# Patient Record
Sex: Female | Born: 1975 | Race: Black or African American | Hispanic: No | Marital: Single | Smoking: Current every day smoker
Health system: Southern US, Community
[De-identification: ages and names within clinical notes are randomized; demographics above are authoritative.]

## PROBLEM LIST (undated history)

## (undated) DIAGNOSIS — R87629 Unspecified abnormal cytological findings in specimens from vagina: Secondary | ICD-10-CM

## (undated) DIAGNOSIS — M25512 Pain in left shoulder: Secondary | ICD-10-CM

## (undated) DIAGNOSIS — I10 Essential (primary) hypertension: Secondary | ICD-10-CM

## (undated) HISTORY — PX: TUBAL LIGATION: SHX77

## (undated) HISTORY — DX: Pain in left shoulder: M25.512

## (undated) HISTORY — DX: Essential (primary) hypertension: I10

## (undated) HISTORY — DX: Unspecified abnormal cytological findings in specimens from vagina: R87.629

---

## 1999-01-11 ENCOUNTER — Emergency Department (HOSPITAL_COMMUNITY): Admission: EM | Admit: 1999-01-11 | Discharge: 1999-01-11 | Payer: Self-pay | Admitting: Emergency Medicine

## 1999-04-11 ENCOUNTER — Emergency Department (HOSPITAL_COMMUNITY): Admission: EM | Admit: 1999-04-11 | Discharge: 1999-04-11 | Payer: Self-pay | Admitting: Emergency Medicine

## 1999-06-03 ENCOUNTER — Inpatient Hospital Stay (HOSPITAL_COMMUNITY): Admission: AD | Admit: 1999-06-03 | Discharge: 1999-06-03 | Payer: Self-pay | Admitting: Obstetrics & Gynecology

## 1999-06-05 ENCOUNTER — Emergency Department (HOSPITAL_COMMUNITY): Admission: EM | Admit: 1999-06-05 | Discharge: 1999-06-05 | Payer: Self-pay | Admitting: Emergency Medicine

## 1999-06-05 ENCOUNTER — Encounter: Payer: Self-pay | Admitting: Emergency Medicine

## 1999-06-09 ENCOUNTER — Emergency Department (HOSPITAL_COMMUNITY): Admission: EM | Admit: 1999-06-09 | Discharge: 1999-06-09 | Payer: Self-pay | Admitting: Emergency Medicine

## 1999-11-02 ENCOUNTER — Encounter: Payer: Self-pay | Admitting: Emergency Medicine

## 1999-11-02 ENCOUNTER — Emergency Department (HOSPITAL_COMMUNITY): Admission: EM | Admit: 1999-11-02 | Discharge: 1999-11-02 | Payer: Self-pay | Admitting: Emergency Medicine

## 1999-11-05 ENCOUNTER — Emergency Department (HOSPITAL_COMMUNITY): Admission: EM | Admit: 1999-11-05 | Discharge: 1999-11-05 | Payer: Self-pay | Admitting: Emergency Medicine

## 1999-11-26 ENCOUNTER — Emergency Department (HOSPITAL_COMMUNITY): Admission: EM | Admit: 1999-11-26 | Discharge: 1999-11-26 | Payer: Self-pay | Admitting: Emergency Medicine

## 1999-11-26 ENCOUNTER — Encounter: Payer: Self-pay | Admitting: Emergency Medicine

## 2000-03-17 ENCOUNTER — Encounter: Payer: Self-pay | Admitting: Emergency Medicine

## 2000-03-17 ENCOUNTER — Emergency Department (HOSPITAL_COMMUNITY): Admission: EM | Admit: 2000-03-17 | Discharge: 2000-03-17 | Payer: Self-pay | Admitting: Emergency Medicine

## 2000-09-02 ENCOUNTER — Emergency Department (HOSPITAL_COMMUNITY): Admission: EM | Admit: 2000-09-02 | Discharge: 2000-09-02 | Payer: Self-pay | Admitting: *Deleted

## 2000-12-19 ENCOUNTER — Emergency Department (HOSPITAL_COMMUNITY): Admission: EM | Admit: 2000-12-19 | Discharge: 2000-12-19 | Payer: Self-pay | Admitting: Emergency Medicine

## 2001-01-27 ENCOUNTER — Emergency Department (HOSPITAL_COMMUNITY): Admission: EM | Admit: 2001-01-27 | Discharge: 2001-01-27 | Payer: Self-pay | Admitting: Emergency Medicine

## 2001-12-09 ENCOUNTER — Inpatient Hospital Stay (HOSPITAL_COMMUNITY): Admission: AD | Admit: 2001-12-09 | Discharge: 2001-12-09 | Payer: Self-pay | Admitting: *Deleted

## 2001-12-10 ENCOUNTER — Encounter: Payer: Self-pay | Admitting: *Deleted

## 2001-12-10 ENCOUNTER — Inpatient Hospital Stay (HOSPITAL_COMMUNITY): Admission: AD | Admit: 2001-12-10 | Discharge: 2001-12-10 | Payer: Self-pay | Admitting: *Deleted

## 2001-12-21 ENCOUNTER — Inpatient Hospital Stay (HOSPITAL_COMMUNITY): Admission: RE | Admit: 2001-12-21 | Discharge: 2001-12-21 | Payer: Self-pay | Admitting: *Deleted

## 2001-12-21 ENCOUNTER — Encounter: Payer: Self-pay | Admitting: *Deleted

## 2002-01-04 ENCOUNTER — Encounter: Payer: Self-pay | Admitting: *Deleted

## 2002-01-04 ENCOUNTER — Inpatient Hospital Stay (HOSPITAL_COMMUNITY): Admission: RE | Admit: 2002-01-04 | Discharge: 2002-01-04 | Payer: Self-pay | Admitting: *Deleted

## 2002-02-02 ENCOUNTER — Inpatient Hospital Stay (HOSPITAL_COMMUNITY): Admission: AD | Admit: 2002-02-02 | Discharge: 2002-02-02 | Payer: Self-pay | Admitting: *Deleted

## 2002-04-15 ENCOUNTER — Inpatient Hospital Stay (HOSPITAL_COMMUNITY): Admission: AD | Admit: 2002-04-15 | Discharge: 2002-04-15 | Payer: Self-pay | Admitting: Obstetrics

## 2002-04-15 ENCOUNTER — Encounter: Payer: Self-pay | Admitting: Obstetrics

## 2002-06-06 ENCOUNTER — Encounter: Payer: Self-pay | Admitting: Obstetrics

## 2002-06-06 ENCOUNTER — Ambulatory Visit (HOSPITAL_COMMUNITY): Admission: RE | Admit: 2002-06-06 | Discharge: 2002-06-06 | Payer: Self-pay | Admitting: Obstetrics

## 2002-06-14 ENCOUNTER — Inpatient Hospital Stay (HOSPITAL_COMMUNITY): Admission: AD | Admit: 2002-06-14 | Discharge: 2002-06-16 | Payer: Self-pay | Admitting: Obstetrics

## 2002-06-27 ENCOUNTER — Inpatient Hospital Stay (HOSPITAL_COMMUNITY): Admission: AD | Admit: 2002-06-27 | Discharge: 2002-06-29 | Payer: Self-pay | Admitting: Obstetrics

## 2002-06-28 ENCOUNTER — Encounter: Payer: Self-pay | Admitting: Obstetrics

## 2002-06-30 ENCOUNTER — Encounter: Payer: Self-pay | Admitting: Obstetrics & Gynecology

## 2002-06-30 ENCOUNTER — Inpatient Hospital Stay (HOSPITAL_COMMUNITY): Admission: AD | Admit: 2002-06-30 | Discharge: 2002-06-30 | Payer: Self-pay | Admitting: Obstetrics

## 2002-07-01 ENCOUNTER — Encounter (INDEPENDENT_AMBULATORY_CARE_PROVIDER_SITE_OTHER): Payer: Self-pay | Admitting: Specialist

## 2002-07-01 ENCOUNTER — Inpatient Hospital Stay (HOSPITAL_COMMUNITY): Admission: AD | Admit: 2002-07-01 | Discharge: 2002-07-04 | Payer: Self-pay | Admitting: Obstetrics

## 2002-11-18 ENCOUNTER — Emergency Department (HOSPITAL_COMMUNITY): Admission: EM | Admit: 2002-11-18 | Discharge: 2002-11-18 | Payer: Self-pay

## 2003-02-25 ENCOUNTER — Emergency Department (HOSPITAL_COMMUNITY): Admission: EM | Admit: 2003-02-25 | Discharge: 2003-02-26 | Payer: Self-pay | Admitting: Emergency Medicine

## 2003-05-04 ENCOUNTER — Emergency Department (HOSPITAL_COMMUNITY): Admission: EM | Admit: 2003-05-04 | Discharge: 2003-05-04 | Payer: Self-pay | Admitting: Emergency Medicine

## 2004-02-13 ENCOUNTER — Emergency Department (HOSPITAL_COMMUNITY): Admission: EM | Admit: 2004-02-13 | Discharge: 2004-02-13 | Payer: Self-pay | Admitting: Emergency Medicine

## 2004-03-01 ENCOUNTER — Emergency Department (HOSPITAL_COMMUNITY): Admission: EM | Admit: 2004-03-01 | Discharge: 2004-03-01 | Payer: Self-pay | Admitting: Emergency Medicine

## 2005-02-17 ENCOUNTER — Inpatient Hospital Stay (HOSPITAL_COMMUNITY): Admission: AD | Admit: 2005-02-17 | Discharge: 2005-02-17 | Payer: Self-pay | Admitting: *Deleted

## 2005-02-17 ENCOUNTER — Ambulatory Visit: Payer: Self-pay | Admitting: Certified Nurse Midwife

## 2005-02-24 ENCOUNTER — Ambulatory Visit: Payer: Self-pay | Admitting: Family Medicine

## 2005-03-03 ENCOUNTER — Ambulatory Visit: Payer: Self-pay | Admitting: *Deleted

## 2005-03-17 ENCOUNTER — Ambulatory Visit: Payer: Self-pay | Admitting: Family Medicine

## 2005-03-17 ENCOUNTER — Ambulatory Visit (HOSPITAL_COMMUNITY): Admission: RE | Admit: 2005-03-17 | Discharge: 2005-03-17 | Payer: Self-pay | Admitting: *Deleted

## 2005-03-31 ENCOUNTER — Ambulatory Visit: Payer: Self-pay | Admitting: Family Medicine

## 2005-04-06 ENCOUNTER — Ambulatory Visit (HOSPITAL_COMMUNITY): Admission: RE | Admit: 2005-04-06 | Discharge: 2005-04-06 | Payer: Self-pay | Admitting: *Deleted

## 2005-04-06 ENCOUNTER — Ambulatory Visit: Payer: Self-pay | Admitting: *Deleted

## 2005-04-20 ENCOUNTER — Ambulatory Visit: Payer: Self-pay | Admitting: Obstetrics & Gynecology

## 2005-04-27 ENCOUNTER — Ambulatory Visit (HOSPITAL_COMMUNITY): Admission: RE | Admit: 2005-04-27 | Discharge: 2005-04-27 | Payer: Self-pay | Admitting: *Deleted

## 2005-04-27 ENCOUNTER — Ambulatory Visit: Payer: Self-pay | Admitting: Obstetrics & Gynecology

## 2005-05-04 ENCOUNTER — Ambulatory Visit: Payer: Self-pay | Admitting: Obstetrics & Gynecology

## 2005-05-09 ENCOUNTER — Inpatient Hospital Stay (HOSPITAL_COMMUNITY): Admission: AD | Admit: 2005-05-09 | Discharge: 2005-05-09 | Payer: Self-pay | Admitting: Obstetrics & Gynecology

## 2005-05-09 ENCOUNTER — Ambulatory Visit: Payer: Self-pay | Admitting: Family Medicine

## 2005-05-10 ENCOUNTER — Inpatient Hospital Stay (HOSPITAL_COMMUNITY): Admission: AD | Admit: 2005-05-10 | Discharge: 2005-05-10 | Payer: Self-pay | Admitting: *Deleted

## 2005-05-10 ENCOUNTER — Ambulatory Visit: Payer: Self-pay | Admitting: *Deleted

## 2005-05-11 ENCOUNTER — Ambulatory Visit: Payer: Self-pay | Admitting: Obstetrics and Gynecology

## 2005-05-11 ENCOUNTER — Inpatient Hospital Stay (HOSPITAL_COMMUNITY): Admission: AD | Admit: 2005-05-11 | Discharge: 2005-05-14 | Payer: Self-pay | Admitting: *Deleted

## 2005-05-11 ENCOUNTER — Encounter (INDEPENDENT_AMBULATORY_CARE_PROVIDER_SITE_OTHER): Payer: Self-pay | Admitting: *Deleted

## 2005-06-03 ENCOUNTER — Inpatient Hospital Stay (HOSPITAL_COMMUNITY): Admission: AD | Admit: 2005-06-03 | Discharge: 2005-06-07 | Payer: Self-pay | Admitting: Family Medicine

## 2005-06-03 ENCOUNTER — Ambulatory Visit: Payer: Self-pay | Admitting: Obstetrics and Gynecology

## 2005-06-21 ENCOUNTER — Ambulatory Visit: Payer: Self-pay | Admitting: Obstetrics and Gynecology

## 2007-05-10 ENCOUNTER — Emergency Department (HOSPITAL_COMMUNITY): Admission: EM | Admit: 2007-05-10 | Discharge: 2007-05-10 | Payer: Self-pay | Admitting: Emergency Medicine

## 2007-05-18 ENCOUNTER — Ambulatory Visit: Payer: Self-pay | Admitting: *Deleted

## 2007-09-12 ENCOUNTER — Emergency Department (HOSPITAL_COMMUNITY): Admission: EM | Admit: 2007-09-12 | Discharge: 2007-09-12 | Payer: Self-pay | Admitting: Emergency Medicine

## 2007-10-28 ENCOUNTER — Emergency Department (HOSPITAL_COMMUNITY): Admission: EM | Admit: 2007-10-28 | Discharge: 2007-10-28 | Payer: Self-pay | Admitting: Emergency Medicine

## 2007-10-31 ENCOUNTER — Emergency Department (HOSPITAL_COMMUNITY): Admission: EM | Admit: 2007-10-31 | Discharge: 2007-10-31 | Payer: Self-pay | Admitting: Emergency Medicine

## 2008-10-29 ENCOUNTER — Emergency Department (HOSPITAL_COMMUNITY): Admission: EM | Admit: 2008-10-29 | Discharge: 2008-10-29 | Payer: Self-pay | Admitting: Emergency Medicine

## 2009-08-05 ENCOUNTER — Emergency Department (HOSPITAL_COMMUNITY): Admission: EM | Admit: 2009-08-05 | Discharge: 2009-08-06 | Payer: Self-pay | Admitting: Emergency Medicine

## 2009-11-27 ENCOUNTER — Emergency Department (HOSPITAL_COMMUNITY): Admission: EM | Admit: 2009-11-27 | Discharge: 2009-11-27 | Payer: Self-pay | Admitting: Emergency Medicine

## 2010-04-26 ENCOUNTER — Emergency Department (HOSPITAL_COMMUNITY): Admission: EM | Admit: 2010-04-26 | Discharge: 2010-04-26 | Payer: Self-pay | Admitting: Family Medicine

## 2010-04-29 ENCOUNTER — Emergency Department (HOSPITAL_COMMUNITY): Admission: EM | Admit: 2010-04-29 | Discharge: 2010-04-29 | Payer: Self-pay | Admitting: Emergency Medicine

## 2010-05-03 ENCOUNTER — Emergency Department (HOSPITAL_COMMUNITY): Admission: EM | Admit: 2010-05-03 | Discharge: 2010-05-03 | Payer: Self-pay | Admitting: Emergency Medicine

## 2010-05-23 ENCOUNTER — Emergency Department (HOSPITAL_COMMUNITY): Admission: EM | Admit: 2010-05-23 | Discharge: 2010-05-23 | Payer: Self-pay | Admitting: Family Medicine

## 2010-08-14 ENCOUNTER — Encounter: Payer: Self-pay | Admitting: *Deleted

## 2010-10-06 LAB — CULTURE, ROUTINE-ABSCESS

## 2010-10-30 ENCOUNTER — Emergency Department (HOSPITAL_COMMUNITY)
Admission: EM | Admit: 2010-10-30 | Discharge: 2010-10-30 | Disposition: A | Discharge status: Home / Self Care | Payer: Medicaid Other | Attending: Emergency Medicine | Admitting: Emergency Medicine

## 2010-10-30 DIAGNOSIS — N63 Unspecified lump in unspecified breast: Secondary | ICD-10-CM | POA: Insufficient documentation

## 2010-10-30 DIAGNOSIS — N644 Mastodynia: Secondary | ICD-10-CM | POA: Insufficient documentation

## 2010-12-10 NOTE — Discharge Summary (Signed)
NAMEMISSEY, HASLEY              ACCOUNT NO.:  [id number]   MEDICAL RECORD NO.:  [id number]          PATIENT TYPE:  INP   LOCATION:  9106                          FACILITY:  [facility]   PHYSICIAN:  Conni Elliot, M.D.DATE OF BIRTH:  1975-12-06   DATE OF ADMISSION:  05/11/2005  DATE OF DISCHARGE:  05/14/2005                                 DISCHARGE SUMMARY   ADMISSION DIAGNOSIS:  Onset of labor.   DIAGNOSIS:  Term pregnancy, delivered.   PROCEDURE:  1.  Repeat low transverse cesarean section, May 11, 2005.  2.  Bilateral tubal ligation.   HOSPITAL COURSE:  On May 11, 2005 at 2118 Hr. this 35 year old G5, now  P2, 1, 2, 3, at 37-2/7 weeks underwent repeat low transverse cesarean  section secondary to slow to progress as well as maternal fatigue. The  mother delivered a viable female infant with Apgar's of 9 and 9. An intact 3-  vessel placenta was delivered at 2120 Hr. A female infant was delivered in  the cephalic position weighing 5 pounds, 11 ounces. No complications.   Bilateral tubal ligation was performed on the same day with no postoperative  complications. On the day of discharge the patient was afebrile, vital signs  were stable.  The infant will be bottle fed.   PERTINENT LABORATORY DATA:  Discharge hemoglobin 9. AB positive. Rubella  immune. GBS positive for which the patient was treated with Penicillin  throughout her attempt at labor.   DISCHARGE MEDICATIONS:  1.  Ibuprofen 600 mg q.4-6h p.r.n. cramping.  2.  Percocet 5/325 mg one tablet p.o. q.4-6h p.r.n. pain, #20.   DISCHARGE INSTRUCTIONS:  No heavy lifting anything heavier than the weight  of her baby for 1-2 weeks. Nothing in the vagina x6 weeks. She is to return  to [facility] in 6 weeks for a follow up visit.   DISCHARGE CONDITION:  Stable.      Alanson Puls, M.D.    ______________________________  Conni Elliot, M.D.    MR/MEDQ  D:  05/14/2005  T:  05/14/2005  Job:  [id number]

## 2010-12-10 NOTE — Op Note (Signed)
   NAME:  [name], [name]                        ACCOUNT NO.:  [id number]   MEDICAL RECORD NO.:  [id number]                   PATIENT TYPE:  INP   LOCATION:  9126                                 [facility]ACILITY[facility]  [facility]   PHYSICIAN:  Kathreen Cosier, M.D.           DATE OF BIRTH:  05/04/1976   DATE OF PROCEDURE:  DATE OF DISCHARGE:                                 OPERATIVE REPORT   PREOPERATIVE DIAGNOSES:  1. A 34 week pregnancy.  2. Prolonged ruptured membranes.  3. Now in active labor with severe variables with each contraction.   SURGEON:  Kathreen Cosier, M.D.   ANESTHESIA:  Spinal.   PROCEDURE:  The patient placed on the operating table in supine position  after spinal administered.  Abdomen prepped and draped.  Bladder emptied  with Foley catheter.  Transverse suprapubic incision made, carried down to  the rectus fascia.  Fascia cleaned and incised the length of incision.  Recti muscles retracted laterally.  Peritoneum incised longitudinally.  Transverse incision made in the visceroperitoneum above the bladder and  bladder mobilized inferiorly.  Transverse lower uterine incision was made.  The patient was delivered from the OA position of a female, Apgars 7/9,  weighing 4 pounds 7.8 ounces.  Cord pH was 7.35.  There was a nuchal cord x2  and then some terminal meconium.  The placenta was posterior, low lying,  removed manually.  Uterine cavity closed in one layer with continuous suture  of number 1 chromic.  Hemostasis was satisfactory.  Bladder flap reattached  with 2-0 chromic.  Uterus well contracted.  Tubes and ovaries normal.  Abdomen closed in layers.  Peritoneum continuous suture of 0 chromic.  Fascia continuous suture of 0 Dexon and the skin closed with subcuticular  stitch of 3-0 plain.  Blood loss 500 cc.                                               Kathreen Cosier, M.D.    BAM/MEDQ  D:  07/01/2002  T:  07/01/2002  Job:  [id number]

## 2010-12-10 NOTE — Discharge Summary (Signed)
   NAME:  [name], [name]                        ACCOUNT NO.:  [id number]   MEDICAL RECORD NO.:  [id number]                   PATIENT TYPE:  INP   LOCATION:  9156                                 [facility]ILITY[facility]   PHYSICIAN:  Roseanna Rainbow, M.D.         DATE OF BIRTH:  06-22-1976   DATE OF ADMISSION:  06/27/2002  DATE OF DISCHARGE:  06/29/2002                                 DISCHARGE SUMMARY   CHIEF COMPLAINT:  The patient is a 35 year old gravida 4, para 1 with an EDC  of January 14 with history of a posterior placenta previa who complains of  ruptured membranes.   HISTORY OF PRESENT ILLNESS:  The patient ruptured membranes several hours  prior to presentation.  The fluid was clear.  The patient also reported  occasional contractions and denied any bleeding.   PHYSICAL EXAMINATION:  VITAL SIGNS:  Stable, afebrile.  HEENT:  Normocephalic, atraumatic.  LUNGS:  Clear to auscultation bilaterally.  HEART:  Regular rate and rhythm without murmurs, rubs, or gallops.  ABDOMEN:  Fundal height 34 cm.  EXTREMITIES:  Lower extremities:  No clubbing, cyanosis, edema.   ASSESSMENT:  1. Intrauterine pregnancy at 34 weeks.  2. History of placenta previa now with preterm premature rupture of     membranes.   PLAN:  Admission.  Magnesium sulfate tocolysis and steroids.   HOSPITAL COURSE:  The patient was also started on intravenous ampicillin.  An ultrasound on hospital day number one demonstrated an AFI of 8.1 with a  complete posterior previa.  The patient on hospital day number two signed  out against medical advice.   DISCHARGE DIAGNOSES:  Intrauterine pregnancy at 33-34 weeks with a placenta  previa, rule out preterm premature rupture of membranes.   CONDITION ON DISCHARGE:  Guarded.   DIET:  Regular.   ACTIVITY:  Pelvic rest, bed rest.   DISPOSITION:  The patient was to contact Kathreen Cosier, M.D. office to  arrange follow-up.  She was also counseled to return to the  hospital.   MEDICATIONS:  None.                                              Roseanna Rainbow, M.D.   Judee Clara  D:  09/23/2002  T:  09/23/2002  Job:  [id number]

## 2010-12-10 NOTE — H&P (Signed)
   NAME:  [name], [name]                        ACCOUNT NO.:  [id number]   MEDICAL RECORD NO.:  [id number]                   PATIENT TYPE:  INP   LOCATION:  9126                                 [facility]   PHYSICIAN:  Kathreen Cosier, M.D.           DATE OF BIRTH:  [birth year]   DATE OF ADMISSION:  07/01/2002  DATE OF DISCHARGE:                                HISTORY & PHYSICAL   HISTORY OF PRESENT ILLNESS:  The patient is a 35 year old gravida 4, para 1,  0-3-1, [facility] August 07, 2002. The patient had been followed with  ultrasounds  because she had some second trimester bleeding. An ultrasound repeated  showed total placenta previa posterior. She was hospitalized on June 27, 2002, with obvious ruptured membranes. An ultrasound  done on June 28, 2002, showed posterior previa with AFI 11. A repeat ultrasound on  June 30, 2002, showed an AFI of 4. The patient was contracting when she was in the  hospital, and she had had no bleeding prior to  the one episode in the early  second trimester and she was placed on magnesium sulfate. The patient signed  herself out of  the hospital on June 29, 2002, and came back on June 30, 2002, for an AFI which was 4 at that time. She still refused to be  hospitalized after being persuaded by Dr. Tamela Oddi.   The patient now came in on the morning of the 8th in active labor with beat  prolonged variables with each contraction. She was also pushing. She was not  bleeding. It was decided a gentle pelvic would be performed and her cervix  was 4 cm, 100% vertex, -2 station. Because of the severity of her  decelerations. It was decided she would deliver by cesarean section.   PHYSICAL EXAMINATION:  GENERAL:  A well developed female.  HEENT:  Negative.  LUNGS:  Clear.  HEART:  Regular rhythm, no murmurs, no gallops.  ABDOMEN:  34 week sized.  EXTREMITIES:  Negative.                                               Kathreen Cosier, M.D.    BAM/MEDQ  D:  07/01/2002  T:  07/01/2002  Job:  [id number]

## 2010-12-10 NOTE — Discharge Summary (Signed)
[name], [name]              ACCOUNT NO.:  [id number]   MEDICAL RECORD NO.:  [id number]          PATIENT TYPE:  INP   LOCATION:  9306                          [facility]   PHYSICIAN:  Tanya S. Shawnie Pons, M.D.   DATE OF BIRTH:  Jan 14, 1976   DATE OF ADMISSION:  06/03/2005  DATE OF DISCHARGE:  06/07/2005                                 DISCHARGE SUMMARY   DISCHARGE DIAGNOSES:  1.  Endometritis.  2.  Status post routine low transverse cesarean section and bilateral tubal      ligation performed on May 11, 2005.   DISCHARGE MEDICATIONS:  1.  Flagyl 500 mg one tablet p.o. b.i.d. x10 days.  2.  Ciprofloxacin 500 mg one tablet p.o. b.i.d. x10 days.  3.  Percocet 5/325 mg one tablet every six hours as needed for pain.  4.  Colace 100 mg one tablet every day as stool softener while receiving      Percocet.   CONSULTS:  None.   PROCEDURES:  CT scan performed on June 04, 2005 of the pelvis that  showed three separate hepatic lesions, two of which were in the right lobe  __________, one in the medial segment of the left lobe which is atypical.  No acute intra-abdominal process.  Enlarged uterus with air within the  endometrial cavity concerning for __________ and moderate inflammatory  change surrounding the uterus and adnexa with free fluid in the cul-de-sac.  No evidence for appendicitis.   PERTINENT LABORATORIES:  Urine cultures on June 06, 2005 were positive  for group B Strep.  Blood cultures x3 were no growth to date.  Sedimentation  rate was 13 on November 11.  CBC on date of admission was noted to be white  blood cell count of 7.9, hemoglobin 11.7, hematocrit 35.3 with 83%  neutrophils, 10% lymphocytes, 10% monocytes.  BNP was within normal limits.  GC, Chlamydia negative.  Wet prep positive for white blood cells.   BRIEF HOSPITAL COURSE:  Ms. Buehler is a 35 year old African-American  female who was admitted on June 03, 2005 with abdominal pain concerning  for endometritis and a questionable pelvic abscess and fever.  Patient was  noted to have rebound tenderness right greater than left, cervical motion  tenderness, guarding, however, with positive bowel sounds.  The patient was  admitted and laboratories were obtained and the patient was started on IV  antibiotics of ampicillin, gentamicin, and clindamycin.  A CT of the pelvis  was performed which revealed free fluid in the uterus, no concern for  appendicitis, and free air in the uterus.  The patient was continued on IV  antibiotics for a total of four days in which she had noted improvement of  her abdominal pain.  The patient did not have rebound, only mild tenderness  to palpation in the lower quadrant on date of discharge.  The patient  remained afebrile for 48 hours status post IV antibiotics.  The patient  tolerated p.o. and her pain was well controlled with morphine PCA and  Percocet one day prior to discharge.  Patient has now reached maximum  benefit of this hospitalization and is now ready for discharge.  Patient  will be discharged today.  Vital signs are stable.  Her abdominal  examination is as follows:  Mild tenderness to palpation with the lower  quadrant.  No rebound.  No  guarding.  Positive bowel sounds.  Patient will be followed up in the GYN  clinic in two weeks.  Patient will be sent home with p.o. antibiotics to  complete a 10-day course as well as Percocet for pain control and Colace for  stool softener.      Barth Kirks, M.D.    ______________________________  Shelbie Proctor. Shawnie Pons, M.D.    MB/MEDQ  D:  06/07/2005  T:  06/07/2005  Job:  [id number]

## 2010-12-10 NOTE — Op Note (Signed)
[name], [name]              ACCOUNT NO.:  [id number]   MEDICAL RECORD NO.:  [id number]          PATIENT TYPE:  INP   LOCATION:  9106                          [facility]   PHYSICIAN:  Phil D. Okey Dupre, M.D.     DATE OF BIRTH:  09-16-1975   DATE OF PROCEDURE:  05/11/2005  DATE OF DISCHARGE:                                 OPERATIVE REPORT   PROCEDURE:  Repeat low transverse cesarean section via Pfannenstiel and BTL.   PREOPERATIVE DIAGNOSIS:  1.  37-2/7 week intrauterine pregnancy.  2.  VBAC attempt with the patient request to convert to cesarean section.  3.  Desires permanent sterilization.   POSTOPERATIVE DIAGNOSIS:  1.  37-2/7 week intrauterine pregnancy.  2.  VBAC attempt with the patient request to convert to cesarean section.  3.  Desires permanent sterilization.   SURGEON:  Javier Glazier. Okey Dupre, M.D.   ASSISTANTKaroline Caldwell B. Merlene Morse, M.D.   ANESTHESIA:  Epidural.   COMPLICATIONS:  None.   ESTIMATED BLOOD LOSS:  450 mL.   URINE OUTPUT:  150 mL of clear urine at the end of the procedure.   INDICATIONS FOR PROCEDURE:  A 35 year old gravida 5, para 1-1-2-2 at 37-2/7  weeks who came in with spontaneous onset of labor with a history of previous  cesarean section and desired a repeat cesarean section.  Maximum dilatation  was 7-8 cm.   FINDINGS:  Female infant in cephalic presentation.  Pediatrics present at  delivery.  Weight 5 pounds 11 ounces, normal uterus, tubes, and ovaries.   DESCRIPTION OF PROCEDURE:  The patient was taken to the operating room where  epidural anesthesia was found to be adequate.  She was then prepped and  draped in the usual sterile fashion in the dorsal supine position with a  leftward tilt.  The Pfannenstiel skin incision was then made with the  scalpel and carried through the underlying fascia with a Bovie.  The fascia  was then incised in the midline and incision extended laterally with Mayo  scissors.  Superior aspect of the fascial  incision was then grasped with a  Kocher clamp, elevated, and underlying rectus muscle was dissected off  bluntly.  Attention was then turned to the inferior aspect of the incision  which in a similar fashion was grasped, tented up with a Kocher clamp, and  dissected off bluntly.  The rectus muscles were then separated in the  midline.  The peritoneum identified, tented up, and entered sharply with  Metzenbaum scissors.  The peritoneal incision was then extended superiorly  and inferiorly with good visualization of the bladder.  The bladder blade  was then reinserted and the vesicouterine peritoneum identified, grasped  with the pickups, and entered sharply with the Metzenbaum scissors.  The  incision was then extended laterally and the bladder flap created digitally.  The bladder blade was then reinserted and the lower uterine segment incised  in a transverse fashion with a scalpel.  The uterine incision was extended  laterally digitally.  The bladder blade was then removed and the infant's  head delivered atraumatically.  The  nose and mouth were suctioned with the  bulb syringe and the cord clamped and cut.  The infant was handed to the  awaiting pediatrician.  Cord gases were sent and pH was 7.30.  The placenta  was then removed spontaneously.  The uterus was cleared of all clots and  debris.  The uterine incision was repaired in a running locked fashion.  The  patient's left fallopian tube was then identified, brought to the incision,  grasped with a Babcock clamp.  The tube was then followed out to the  fimbriated end.  Babcock clamp was then used to grasp it approximately 4 cm  from the cornual region.  A 3 cm segment of tube was then ligated with a  free tie and a second layer of free tie was applied below the first tie.  The segment was then excised and cauterized with the Bovie.  Good hemostasis  was noted.  The tube was returned to the abdomen.  The right fallopian tube  was then  ligated and a 3 cm segment excised in a similar fashion and  cauterized in the same fashion.  Excellent hemostasis was noted and the tube  was returned to the abdomen.  The gutters were cleared of all clots and the  fascia was reapproximated in a running fashion.  The skin was closed with  staples.  The patient tolerated the procedure well.  Needle, sponge, and  instrument counts correct x2.  2 grams of Ancef was given at cord clamp.  The patient was taken to the recovery room in stable condition.     ______________________________  August Saucer Merlene Morse, MD    ______________________________  Javier Glazier Okey Dupre, M.D.    ABC/MEDQ  D:  05/11/2005  T:  05/12/2005  Job:  [id number]

## 2010-12-10 NOTE — Discharge Summary (Signed)
   NAME:  [name], [name]                        ACCOUNT NO.:  [id number]   MEDICAL RECORD NO.:  [id number]                   PATIENT TYPE:  INP   LOCATION:  9126                                 [facility]ILITY[facility]   PHYSICIAN:  Kathreen Cosier, M.D.           DATE OF BIRTH:  [birth year]   DATE OF ADMISSION:  07/01/2002  DATE OF DISCHARGE:  07/04/2002                                 DISCHARGE SUMMARY   HOSPITAL COURSE:  The patient is a 35 year old gravida 4 para 1-0-3-1, [facility]  August 07, 2002; hospitalized on June 27, 2002 with ruptured membranes.  She had a history of a posterior placenta previa with no bleeding throughout  the pregnancy.  The patient signed herself out of the hospital on June 29, 2002 and on December 7 she came back and had an ultrasound for AFI; AFI  was 4.  She came back in on the morning of December 8 in labor with  repetitive deep variables with each contraction; no bleeding was noted.  A  gentle pelvic exam was done because the patient was pushing.  Cervix was 4  cm, 100%, with the vertex at -2.  She was delivered via C section because of  nonreassuring fetal heart rate and had a posterior low-lying placenta.  Preoperatively her hemoglobin was 11+; postoperatively, 10.8.  She had a  female, Apgars of 7 and 9, 4 pounds 7 ounces, pH 7.35, nuchal cord x2.  Placenta was sent to pathology.  Postoperatively the patient did well.   DISPOSITION:  She was discharged home on postoperative day #3, ambulatory,  and on a regular diet, on Darvocet-N 100 and ferrous sulfate.  To see me in  six weeks.   DISCHARGE DIAGNOSIS:  Status post primary low transverse cesarean section at  34 weeks because of ruptured membranes with active labor and nonreassuring  fetal heart rate tracing.                                               Kathreen Cosier, M.D.    BAM/MEDQ  D:  07/04/2002  T:  07/04/2002  Job:  [id number]

## 2011-04-19 LAB — CBC
HCT: 37.8
Hemoglobin: 13.1
MCHC: 34.6
MCV: 99.3
Platelets: 238
RBC: 3.8 — ABNORMAL LOW
RDW: 13.7
WBC: 13.5 — ABNORMAL HIGH

## 2011-04-19 LAB — POCT I-STAT, CHEM 8
BUN: 15
Calcium, Ion: 1.17
Chloride: 103
Creatinine, Ser: 1.3 — ABNORMAL HIGH
Glucose, Bld: 87
HCT: 41
Hemoglobin: 13.9
Potassium: 3.7
Sodium: 139
TCO2: 26

## 2011-04-19 LAB — DIFFERENTIAL
Basophils Absolute: 0
Basophils Relative: 0
Eosinophils Absolute: 0
Eosinophils Relative: 0
Lymphocytes Relative: 6 — ABNORMAL LOW
Lymphs Abs: 0.8
Monocytes Absolute: 0.7
Monocytes Relative: 5
Neutro Abs: 12 — ABNORMAL HIGH
Neutrophils Relative %: 89 — ABNORMAL HIGH

## 2011-04-19 LAB — CULTURE, ROUTINE-ABSCESS: Culture: NORMAL

## 2011-05-04 LAB — URINALYSIS, ROUTINE W REFLEX MICROSCOPIC
Bilirubin Urine: NEGATIVE
Glucose, UA: NEGATIVE
Ketones, ur: NEGATIVE
Nitrite: NEGATIVE
Protein, ur: 30 — AB
Specific Gravity, Urine: 1.014
Urobilinogen, UA: 0.2
pH: 6

## 2011-05-04 LAB — URINE CULTURE: Colony Count: 75000

## 2011-05-04 LAB — URINE MICROSCOPIC-ADD ON

## 2012-01-28 ENCOUNTER — Emergency Department (HOSPITAL_COMMUNITY)
Admission: EM | Admit: 2012-01-28 | Discharge: 2012-01-28 | Disposition: A | Discharge status: Home / Self Care | Payer: Self-pay | Attending: Emergency Medicine | Admitting: Emergency Medicine

## 2012-01-28 ENCOUNTER — Encounter (HOSPITAL_COMMUNITY): Payer: Self-pay | Admitting: Emergency Medicine

## 2012-01-28 DIAGNOSIS — T7840XA Allergy, unspecified, initial encounter: Secondary | ICD-10-CM | POA: Insufficient documentation

## 2012-01-28 DIAGNOSIS — F17200 Nicotine dependence, unspecified, uncomplicated: Secondary | ICD-10-CM | POA: Insufficient documentation

## 2012-01-28 DIAGNOSIS — I10 Essential (primary) hypertension: Secondary | ICD-10-CM | POA: Insufficient documentation

## 2012-01-28 MED ORDER — FAMOTIDINE 20 MG PO TABS
20.0000 mg | ORAL_TABLET | Freq: Once | ORAL | Status: AC
  Administered 2012-01-28: 20 mg via ORAL
  Filled 2012-01-28: qty 1

## 2012-01-28 MED ORDER — FAMOTIDINE 20 MG PO TABS: 20.0000 mg | ORAL_TABLET | Freq: Two times a day (BID) | ORAL | Status: DC

## 2012-01-28 MED ORDER — PREDNISONE 20 MG PO TABS: 40.0000 mg | ORAL_TABLET | Freq: Every day | ORAL | Status: AC

## 2012-01-28 MED ORDER — PREDNISONE 20 MG PO TABS
60.0000 mg | ORAL_TABLET | Freq: Once | ORAL | Status: AC
  Administered 2012-01-28: 60 mg via ORAL
  Filled 2012-01-28: qty 3

## 2012-01-28 NOTE — ED Provider Notes (Signed)
History     CSN: [id number]  Arrival date & time 01/28/12  1958   None     Chief Complaint  Patient presents with  . Lymphadenopathy  . Allergic Reaction    (Consider location/radiation/quality/duration/timing/severity/associated sxs/prior treatment) HPI Comments: Patient developed acute submandibular killer swelling after eating spicy chicken wing this evening denies previous allergic reaction to foods or medications.  Denies shortness of breath.  Urticaria, dizziness, tachycardia  Patient is a 36 y.o. female presenting with allergic reaction. The history is provided by the patient.  Allergic Reaction The primary symptoms do not include shortness of breath, nausea, dizziness, rash, angioedema or urticaria.  Significant symptoms that are not present include rhinorrhea.    History reviewed. No pertinent past medical history.  History reviewed. No pertinent past surgical history.  History reviewed. No pertinent family history.  History  Substance Use Topics  . Smoking status: Current Everyday Smoker -- 1.0 packs/day  . Smokeless tobacco: Not on file  . Alcohol Use: No    OB History    Grav Para Term Preterm Abortions TAB SAB Ect Mult Living                  Review of Systems  Constitutional: Negative for chills.  HENT: Negative for congestion, sore throat, rhinorrhea, drooling, mouth sores, trouble swallowing, voice change and sinus pressure.   Respiratory: Negative for shortness of breath.   Gastrointestinal: Negative for nausea.  Musculoskeletal: Negative for myalgias and joint swelling.  Skin: Negative for rash.  Neurological: Negative for dizziness, weakness and headaches.    Allergies  Review of patient's allergies indicates no known allergies.  Home Medications   Current Outpatient Rx  Name Route Sig Dispense Refill  . IBUPROFEN 200 MG PO TABS Oral Take 400 mg by mouth once. For swelling    . FAMOTIDINE 20 MG PO TABS Oral Take 1 tablet (20 mg total) by  mouth 2 (two) times daily. 20 tablet 0  . PREDNISONE 20 MG PO TABS Oral Take 2 tablets (40 mg total) by mouth daily. 10 tablet 0    BP 143/87  Pulse 58  Temp 98.5 F (36.9 C) (Oral)  Resp 16  SpO2 100%  LMP 12/28/2011  Physical Exam  Constitutional: She is oriented to person, place, and time. She appears well-developed and well-nourished.  HENT:  Head: Normocephalic.  Right Ear: External ear normal.  Left Ear: External ear normal.  Nose: Nose normal.  Mouth/Throat: Oropharynx is clear and moist. No oropharyngeal exudate.       Left-sided submandibular swelling  Eyes: Pupils are equal, round, and reactive to light.  Neck:       Left-sided submandibular swelling  Cardiovascular: Normal rate.   Pulmonary/Chest: Effort normal.  Musculoskeletal: Normal range of motion.  Neurological: She is alert and oriented to person, place, and time.  Skin: Skin is warm.    ED Course  Procedures (including critical care time)  Labs Reviewed - No data to display No results found.   1. Allergic reaction       MDM  I believe this is an acute allergic reaction to something in the chicken wings.  Will treat with prednisone, and Pepcid, and reevaluated in 1 hour Decrease in pain and swelling, so I think this is an allergic reaction.  Will discharge patient home with steroids and Pepcid on a regular basis for the next 5 days.  She will investigate the spices that were added on in the barber to some  Arman Filter, NP 01/28/12 2314

## 2012-01-28 NOTE — ED Notes (Signed)
Pt for discharge.Still complains of tenderness in her left side of the neck.GCS 15

## 2012-01-28 NOTE — ED Notes (Signed)
Patient complaining of swollen lymph glands on the left after eating chicken wings with buffalo sauce this evening; patient feels that the reaction started after she ate.  Denies any breathing problems/shortness of breath.  Airway clear.  Swelling has caused left sided ear ache.  Patient reports history of swollen glands with sinus infections in the past; denies sinus infection symptoms.  Patient took ibuprofen at home; provided little to no relief.

## 2012-02-02 NOTE — ED Provider Notes (Signed)
Medical screening examination/treatment/procedure(s) were performed by non-physician practitioner and as supervising physician I was immediately available for consultation/collaboration.  Cyndra Numbers, MD 02/02/12 [phone]

## 2012-12-13 ENCOUNTER — Emergency Department (HOSPITAL_COMMUNITY)
Admission: EM | Admit: 2012-12-13 | Discharge: 2012-12-14 | Disposition: A | Discharge status: Home / Self Care | Payer: Medicaid Other | Attending: Emergency Medicine | Admitting: Emergency Medicine

## 2012-12-13 ENCOUNTER — Emergency Department (HOSPITAL_COMMUNITY): Payer: Medicaid Other

## 2012-12-13 ENCOUNTER — Encounter (HOSPITAL_COMMUNITY): Payer: Self-pay | Admitting: *Deleted

## 2012-12-13 DIAGNOSIS — F17200 Nicotine dependence, unspecified, uncomplicated: Secondary | ICD-10-CM | POA: Insufficient documentation

## 2012-12-13 DIAGNOSIS — Y9389 Activity, other specified: Secondary | ICD-10-CM | POA: Insufficient documentation

## 2012-12-13 DIAGNOSIS — T148XXA Other injury of unspecified body region, initial encounter: Secondary | ICD-10-CM

## 2012-12-13 DIAGNOSIS — S8012XA Contusion of left lower leg, initial encounter: Secondary | ICD-10-CM

## 2012-12-13 DIAGNOSIS — W208XXA Other cause of strike by thrown, projected or falling object, initial encounter: Secondary | ICD-10-CM | POA: Insufficient documentation

## 2012-12-13 DIAGNOSIS — Y929 Unspecified place or not applicable: Secondary | ICD-10-CM | POA: Insufficient documentation

## 2012-12-13 DIAGNOSIS — S8010XA Contusion of unspecified lower leg, initial encounter: Secondary | ICD-10-CM | POA: Insufficient documentation

## 2012-12-13 MED ORDER — IBUPROFEN 800 MG PO TABS: 800.0000 mg | ORAL_TABLET | Freq: Three times a day (TID) | ORAL | Status: DC | PRN

## 2012-12-13 MED ORDER — HYDROCODONE-ACETAMINOPHEN 5-325 MG PO TABS: 1.0000 | ORAL_TABLET | ORAL | Status: DC | PRN

## 2012-12-13 MED ORDER — IBUPROFEN 400 MG PO TABS
800.0000 mg | ORAL_TABLET | Freq: Once | ORAL | Status: AC
  Administered 2012-12-13: 800 mg via ORAL
  Filled 2012-12-13: qty 2

## 2012-12-13 NOTE — ED Provider Notes (Signed)
History     CSN: [id number]  Arrival date & time 12/13/12  2221   First MD Initiated Contact with Patient 12/13/12 2314      Chief Complaint  Patient presents with  . Leg Pain   HPI  History provided by the patient. Patient is a 37 year old female with no significant PMH who presents with pain and injury to left lower leg and foot. Patient states she was helping carry a large heavy dresser up some stairs and while getting to the top of the stairs the dresser fell hitting her left lower shin and ankle area. Since that time she's had increasing pain and swelling to the area. This is also caused reduced range of motion difficulty walking. She denies having any weakness or numbness in the foot or toes. She is not using treatment for symptoms. Denies any other aggravating or alleviating factors. Denies any other associated symptoms.     History reviewed. No pertinent past medical history.  Past Surgical History  Procedure Laterality Date  . Cesarean section      History reviewed. No pertinent family history.  History  Substance Use Topics  . Smoking status: Current Every Day Smoker -- 1.00 packs/day  . Smokeless tobacco: Not on file  . Alcohol Use: No    OB History   Grav Para Term Preterm Abortions TAB SAB Ect Mult Living                  Review of Systems  Neurological: Negative for weakness.  All other systems reviewed and are negative.    Allergies  Review of patient's allergies indicates no known allergies.  Home Medications  No current outpatient prescriptions on file.  BP 132/89  Pulse 97  Temp(Src) 97.7 F (36.5 C) (Oral)  Resp 18  SpO2 97%  Physical Exam  Nursing note and vitals reviewed. Constitutional: She is oriented to person, place, and time. She appears well-developed and well-nourished. No distress.  HENT:  Head: Normocephalic.  Cardiovascular: Normal rate and regular rhythm.   Pulmonary/Chest: Effort normal and breath sounds normal.   Musculoskeletal:  4 cm hematoma to the medial distal aspect of the left anterior leg. Small superficial overlying abrasion without signs of bleeding. Normal distal dorsal pedal pulses and sensation in feet and toes. No gross deformity.  Neurological: She is alert and oriented to person, place, and time.  Skin: Skin is warm and dry.  Psychiatric: She has a normal mood and affect. Her behavior is normal.    ED Course  Procedures   Dg Tibia/fibula Left  12/13/2012   *RADIOLOGY REPORT*  Clinical Data: Left leg injury and pain.  LEFT TIBIA AND FIBULA - 2 VIEW  Comparison:  None.  Findings: There is no evidence of fracture or other focal bone lesions.  Soft tissues are unremarkable.  IMPRESSION: Negative.   Original Report Authenticated By: Myles Rosenthal, M.D.     1. Contusion of leg, left, initial encounter   2. Hematoma       MDM  Patient seen and evaluated. Patient appears well in no acute distress.  X-rays reviewed. No signs of fracture or other concerning injury. Patient has normal dorsal pedal pulses and sensation in foot. She is able to have some movements but range of motion is slightly reduced secondary to pain. Will provide Ace wrap for contusion and hematoma. Patient instructed to use Rice treatment.        Angus Seller, PA-C 12/13/12 2355

## 2012-12-13 NOTE — ED Notes (Signed)
Pt reports that a dresser fell on her (L) lower leg this evening.  Reports that she cannot walk.  Pts pulses, sensation and movement are present.  Slight swelling and small abrasion noted.

## 2012-12-14 NOTE — ED Provider Notes (Signed)
Medical screening examination/treatment/procedure(s) were performed by non-physician practitioner and as supervising physician I was immediately available for consultation/collaboration.   Julie Manly, MD 12/14/12 0737 

## 2013-05-20 ENCOUNTER — Encounter (HOSPITAL_COMMUNITY): Payer: Self-pay | Admitting: Emergency Medicine

## 2013-05-20 ENCOUNTER — Emergency Department (HOSPITAL_COMMUNITY)
Admission: EM | Admit: 2013-05-20 | Discharge: 2013-05-20 | Disposition: A | Discharge status: Home / Self Care | Payer: Medicaid Other | Attending: Emergency Medicine | Admitting: Emergency Medicine

## 2013-05-20 DIAGNOSIS — L02411 Cutaneous abscess of right axilla: Secondary | ICD-10-CM

## 2013-05-20 DIAGNOSIS — F17200 Nicotine dependence, unspecified, uncomplicated: Secondary | ICD-10-CM | POA: Insufficient documentation

## 2013-05-20 DIAGNOSIS — IMO0002 Reserved for concepts with insufficient information to code with codable children: Secondary | ICD-10-CM | POA: Insufficient documentation

## 2013-05-20 MED ORDER — CLINDAMYCIN HCL 150 MG PO CAPS: 450.0000 mg | ORAL_CAPSULE | Freq: Four times a day (QID) | ORAL | Status: DC

## 2013-05-20 MED ORDER — OXYCODONE-ACETAMINOPHEN 5-325 MG PO TABS
2.0000 | ORAL_TABLET | Freq: Once | ORAL | Status: AC
  Administered 2013-05-20: 2 via ORAL
  Filled 2013-05-20: qty 2

## 2013-05-20 MED ORDER — OXYCODONE-ACETAMINOPHEN 5-325 MG PO TABS: 2.0000 | ORAL_TABLET | ORAL | Status: DC | PRN

## 2013-05-20 NOTE — ED Provider Notes (Signed)
CSN: [id number]     Arrival date & time 05/20/13  1321 History  This chart was scribed for non-physician practitioner Irish Elders, NP, working with Shelda Jakes, MD by Dorothey Baseman, ED Scribe. This patient was seen in room TR11C/TR11C and the patient's care was started at 1:45 PM.    Chief Complaint  Patient presents with  . Abscess   Patient is a 37 y.o. female presenting with abscess. The history is provided by the patient. No language interpreter was used.  Abscess Location:  Shoulder/arm Shoulder/arm abscess location:  R axilla Size:  3 cm Abscess quality: draining and painful   Red streaking: no   Progression:  Worsening Pain details:    Quality:  Aching   Severity:  Moderate   Timing:  Constant   Progression:  Worsening Chronicity:  New Relieved by:  None tried Worsened by:  Nothing tried Ineffective treatments:  None tried Risk factors: prior abscess    HPI Comments: [name] is a 37 y.o. female who presents to the Emergency Department complaining of an abscess to the right axilla onset several days ago with an associated constant, aching pain that has been progressively worsening. She reports some intermittent drainage from the area, but there is no active drainage at this time. She states that she has had abscesses before that were incised and drained. Patient denies any other pertinent medical history.  History reviewed. No pertinent past medical history. Past Surgical History  Procedure Laterality Date  . Cesarean section     No family history on file. History  Substance Use Topics  . Smoking status: Current Every Day Smoker -- 1.00 packs/day  . Smokeless tobacco: Not on file  . Alcohol Use: No   OB History   Grav Para Term Preterm Abortions TAB SAB Ect Mult Living                 Review of Systems  Skin: Positive for wound ( abscess).  All other systems reviewed and are negative.    Allergies  Review of patient's allergies indicates no  known allergies.  Home Medications   Current Outpatient Rx  Name  Route  Sig  Dispense  Refill  . HYDROcodone-acetaminophen (NORCO) 5-325 MG per tablet   Oral   Take 1 tablet by mouth every 4 (four) hours as needed for pain.   5 tablet   0   . ibuprofen (ADVIL,MOTRIN) 800 MG tablet   Oral   Take 1 tablet (800 mg total) by mouth every 8 (eight) hours as needed for pain.   30 tablet   0    Triage Vitals: BP 127/75  Pulse 87  Temp(Src) 99 F (37.2 C) (Oral)  Resp 16  Ht 5\' 3"  (1.6 m)  Wt 105 lb 14.4 oz (48.036 kg)  BMI 18.76 kg/m2  SpO2 97%  Physical Exam  Nursing note and vitals reviewed. Constitutional: She is oriented to person, place, and time. She appears well-developed and well-nourished. No distress.  HENT:  Head: Normocephalic and atraumatic.  Eyes: Conjunctivae are normal.  Neck: Normal range of motion. Neck supple.  Pulmonary/Chest: Effort normal. No respiratory distress.  Abdominal: She exhibits no distension.  Musculoskeletal: Normal range of motion.  Neurological: She is alert and oriented to person, place, and time.  Skin: Skin is warm and dry. No erythema.  3 cm abscess to the right axilla that is tender to palpation without warmth, surrounding erythema, or streaking.  Psychiatric: She has a normal mood  and affect. Her behavior is normal.    ED Course  Procedures (including critical care time)  DIAGNOSTIC STUDIES: Oxygen Saturation is 97% on room air, normal by my interpretation.    COORDINATION OF CARE: 1:46 PM- Will order Percocet to manage pain symptoms and perform an incision and drainage of the area. Discussed treatment plan with patient at bedside and patient verbalized agreement.   2:20 PM- Will discharge patient with Clindamycin and Percocet. Advised patient to follow up for a wound check in 3 days, especially if there are any new or worsening symptoms. Discussed treatment plan with patient at bedside and patient verbalized agreement.     INCISION AND DRAINAGE Performed by: Irish Elders, NP Consent: Verbal consent obtained. Risks and benefits: risks, benefits and alternatives were discussed Type: abscess Body area: right axilla Anesthesia: local infiltration Incision was made with a scalpel. Local anesthetic: lidocaine 2% with epinephrine Anesthetic total: 4 ml Complexity: simple Blunt dissection to break up loculations Drainage: purulent Drainage amount: significant Packing material: 1/4 in iodoform gauze, 3 inches Patient tolerance: Patient tolerated the procedure well with no immediate complications.     Labs Review Labs Reviewed - No data to display Imaging Review No results found.  EKG Interpretation   None       MDM   1. Abscess of axilla, right    Follow-up in three days for wound check. Large amount of purulent drainage from I&D. Prescription for Clindamycin as directed. Percocet if needed for pain.   I personally performed the services described in this documentation, which was scribed in my presence. The recorded information has been reviewed and is accurate.     Irish Elders, NP 05/20/13 1438

## 2013-05-20 NOTE — ED Notes (Signed)
Pt has abscess under right arm that drained and stopped draining and is bigger

## 2013-05-20 NOTE — ED Notes (Signed)
Dan Humphreys, NP at bedside for I & D.

## 2013-05-21 NOTE — ED Provider Notes (Signed)
Medical screening examination/treatment/procedure(s) were performed by non-physician practitioner and as supervising physician I was immediately available for consultation/collaboration.  EKG Interpretation   None         Lorice Lafave W. Loyde Orth, MD 05/21/13 0725 

## 2013-05-26 ENCOUNTER — Encounter (HOSPITAL_COMMUNITY): Payer: Self-pay | Admitting: Emergency Medicine

## 2013-05-26 ENCOUNTER — Emergency Department (HOSPITAL_COMMUNITY)
Admission: EM | Admit: 2013-05-26 | Discharge: 2013-05-26 | Disposition: A | Discharge status: Home / Self Care | Payer: Medicaid Other | Attending: Emergency Medicine | Admitting: Emergency Medicine

## 2013-05-26 DIAGNOSIS — L0291 Cutaneous abscess, unspecified: Secondary | ICD-10-CM

## 2013-05-26 DIAGNOSIS — F17200 Nicotine dependence, unspecified, uncomplicated: Secondary | ICD-10-CM | POA: Insufficient documentation

## 2013-05-26 DIAGNOSIS — Z792 Long term (current) use of antibiotics: Secondary | ICD-10-CM | POA: Insufficient documentation

## 2013-05-26 DIAGNOSIS — Z4801 Encounter for change or removal of surgical wound dressing: Secondary | ICD-10-CM | POA: Insufficient documentation

## 2013-05-26 MED ORDER — DOXYCYCLINE HYCLATE 100 MG PO CAPS: 100.0000 mg | ORAL_CAPSULE | Freq: Two times a day (BID) | ORAL | Status: DC

## 2013-05-26 MED ORDER — OXYCODONE-ACETAMINOPHEN 5-325 MG PO TABS: 2.0000 | ORAL_TABLET | ORAL | Status: DC | PRN

## 2013-05-26 MED ORDER — OXYCODONE-ACETAMINOPHEN 5-325 MG PO TABS
2.0000 | ORAL_TABLET | Freq: Once | ORAL | Status: AC
  Administered 2013-05-26: 2 via ORAL
  Filled 2013-05-26: qty 2

## 2013-05-26 NOTE — ED Notes (Signed)
The pt had an i and d of an  Abscess  Beneath her rt arm on Monday.  She is here for the drain to be removed

## 2013-05-26 NOTE — ED Provider Notes (Signed)
CSN: [id number]     Arrival date & time 05/26/13  1921 History   First MD Initiated Contact with Patient 05/26/13 2004     Chief Complaint  Patient presents with  . wound check    (Consider location/radiation/quality/duration/timing/severity/associated sxs/prior Treatment) The history is provided by the patient. No language interpreter was used.  Pt is here for a follow- visit for I&D that was done 6 days ago. She was asked to follow-up 3 days post procedure. She reports that she is still taking her antibiotic and it is draining a yellowish drainage. She still reports some tenderness to her axilla.    History reviewed. No pertinent past medical history. Past Surgical History  Procedure Laterality Date  . Cesarean section     No family history on file. History  Substance Use Topics  . Smoking status: Current Every Day Smoker -- 1.00 packs/day  . Smokeless tobacco: Not on file  . Alcohol Use: No   OB History   Grav Para Term Preterm Abortions TAB SAB Ect Mult Living                 Review of Systems  Constitutional: Negative for fever and chills.  Gastrointestinal: Negative for nausea and vomiting.  Skin: Positive for wound.  All other systems reviewed and are negative.    Allergies  Review of patient's allergies indicates no known allergies.  Home Medications   Current Outpatient Rx  Name  Route  Sig  Dispense  Refill  . clindamycin (CLEOCIN) 150 MG capsule   Oral   Take 3 capsules (450 mg total) by mouth every 6 (six) hours.   45 capsule   0   . doxycycline (VIBRAMYCIN) 100 MG capsule   Oral   Take 1 capsule (100 mg total) by mouth 2 (two) times daily.   20 capsule   0   . oxyCODONE-acetaminophen (PERCOCET/ROXICET) 5-325 MG per tablet   Oral   Take 2 tablets by mouth every 4 (four) hours as needed for pain.   6 tablet   0    BP 133/83  Pulse 62  Temp(Src) 98.1 F (36.7 C) (Oral)  Resp 20  Ht 5\' 3"  (1.6 m)  Wt 109 lb 5 oz (49.584 kg)  BMI 19.37  kg/m2  SpO2 62% Physical Exam  Nursing note and vitals reviewed. Constitutional: She is oriented to person, place, and time. She appears well-developed and well-nourished. No distress.  HENT:  Head: Normocephalic and atraumatic.  Eyes: Conjunctivae and EOM are normal. Pupils are equal, round, and reactive to light.  Neck: Normal range of motion. Neck supple.  Cardiovascular: Normal rate, regular rhythm, normal heart sounds and intact distal pulses.   Pulmonary/Chest: Effort normal and breath sounds normal.  Musculoskeletal: Normal range of motion.  Neurological: She is alert and oriented to person, place, and time.  Skin: Skin is warm and dry.  Right axillary I&D done, 6 days ago. Area of about 3cm, with iodoform gauze packing place and draining yellowish drainage.   Psychiatric: She has a normal mood and affect. Her behavior is normal.    ED Course  Procedures (including critical care time) Labs Review Labs Reviewed - No data to display Imaging Review No results found.  EKG Interpretation   None       MDM   1. Abscess     Iodoform packing removed from right axilla, continues to drain yellowish drainage and still tender to touch. No erythema or streaking, no warmth at site.  No history of fever or chills. Prescription for Doxycycline given. Return in 3 days for wound follow-up.      Irish Elders, NP 05/26/13 [phone]

## 2013-05-27 NOTE — ED Provider Notes (Signed)
Medical screening examination/treatment/procedure(s) were performed by non-physician practitioner and as supervising physician I was immediately available for consultation/collaboration.  EKG Interpretation   None         Megan E Docherty, MD 05/27/13 0004 

## 2013-10-31 ENCOUNTER — Emergency Department (HOSPITAL_COMMUNITY)
Admission: EM | Admit: 2013-10-31 | Discharge: 2013-10-31 | Disposition: A | Discharge status: Home / Self Care | Payer: Medicaid Other | Attending: Emergency Medicine | Admitting: Emergency Medicine

## 2013-10-31 ENCOUNTER — Encounter (HOSPITAL_COMMUNITY): Payer: Self-pay | Admitting: Emergency Medicine

## 2013-10-31 DIAGNOSIS — Z3202 Encounter for pregnancy test, result negative: Secondary | ICD-10-CM | POA: Insufficient documentation

## 2013-10-31 DIAGNOSIS — N12 Tubulo-interstitial nephritis, not specified as acute or chronic: Secondary | ICD-10-CM | POA: Insufficient documentation

## 2013-10-31 DIAGNOSIS — F17200 Nicotine dependence, unspecified, uncomplicated: Secondary | ICD-10-CM | POA: Insufficient documentation

## 2013-10-31 DIAGNOSIS — M79605 Pain in left leg: Secondary | ICD-10-CM

## 2013-10-31 DIAGNOSIS — Z9889 Other specified postprocedural states: Secondary | ICD-10-CM | POA: Insufficient documentation

## 2013-10-31 DIAGNOSIS — Z79899 Other long term (current) drug therapy: Secondary | ICD-10-CM | POA: Insufficient documentation

## 2013-10-31 DIAGNOSIS — M79609 Pain in unspecified limb: Secondary | ICD-10-CM | POA: Insufficient documentation

## 2013-10-31 DIAGNOSIS — M79604 Pain in right leg: Secondary | ICD-10-CM

## 2013-10-31 LAB — CBC WITH DIFFERENTIAL/PLATELET
BASOS ABS: 0 10*3/uL (ref 0.0–0.1)
BASOS PCT: 0 % (ref 0–1)
EOS ABS: 0 10*3/uL (ref 0.0–0.7)
Eosinophils Relative: 0 % (ref 0–5)
HCT: 38.8 % (ref 36.0–46.0)
Hemoglobin: 14.1 g/dL (ref 12.0–15.0)
LYMPHS ABS: 1.7 10*3/uL (ref 0.7–4.0)
Lymphocytes Relative: 15 % (ref 12–46)
MCH: 35 pg — AB (ref 26.0–34.0)
MCHC: 36.3 g/dL — AB (ref 30.0–36.0)
MCV: 96.3 fL (ref 78.0–100.0)
Monocytes Absolute: 1.6 10*3/uL — ABNORMAL HIGH (ref 0.1–1.0)
Monocytes Relative: 14 % — ABNORMAL HIGH (ref 3–12)
NEUTROS PCT: 71 % (ref 43–77)
Neutro Abs: 8.2 10*3/uL — ABNORMAL HIGH (ref 1.7–7.7)
PLATELETS: 189 10*3/uL (ref 150–400)
RBC: 4.03 MIL/uL (ref 3.87–5.11)
RDW: 13.4 % (ref 11.5–15.5)
WBC: 11.5 10*3/uL — ABNORMAL HIGH (ref 4.0–10.5)

## 2013-10-31 LAB — URINE MICROSCOPIC-ADD ON

## 2013-10-31 LAB — URINALYSIS, ROUTINE W REFLEX MICROSCOPIC
BILIRUBIN URINE: NEGATIVE
Glucose, UA: NEGATIVE mg/dL
Ketones, ur: 15 mg/dL — AB
NITRITE: NEGATIVE
PH: 6 (ref 5.0–8.0)
Protein, ur: 30 mg/dL — AB
SPECIFIC GRAVITY, URINE: 1.026 (ref 1.005–1.030)
Urobilinogen, UA: 0.2 mg/dL (ref 0.0–1.0)

## 2013-10-31 LAB — COMPREHENSIVE METABOLIC PANEL
ALBUMIN: 3.8 g/dL (ref 3.5–5.2)
ALK PHOS: 51 U/L (ref 39–117)
ALT: 21 U/L (ref 0–35)
AST: 15 U/L (ref 0–37)
BUN: 17 mg/dL (ref 6–23)
CO2: 23 mEq/L (ref 19–32)
Calcium: 9.1 mg/dL (ref 8.4–10.5)
Chloride: 104 mEq/L (ref 96–112)
Creatinine, Ser: 0.81 mg/dL (ref 0.50–1.10)
GFR calc Af Amer: >90 mL/min (ref >90)
GFR calc non Af Amer: >90 mL/min (ref >90)
Glucose, Bld: 106 mg/dL — ABNORMAL HIGH (ref 70–99)
POTASSIUM: 3.7 meq/L (ref 3.7–5.3)
SODIUM: 142 meq/L (ref 137–147)
TOTAL PROTEIN: 7.5 g/dL (ref 6.0–8.3)
Total Bilirubin: 0.5 mg/dL (ref 0.3–1.2)

## 2013-10-31 LAB — POC URINE PREG, ED: Preg Test, Ur: NEGATIVE

## 2013-10-31 MED ORDER — CIPROFLOXACIN HCL 500 MG PO TABS: 500.0000 mg | ORAL_TABLET | Freq: Two times a day (BID) | ORAL | Status: DC

## 2013-10-31 MED ORDER — HYDROCODONE-ACETAMINOPHEN 5-325 MG PO TABS: 2.0000 | ORAL_TABLET | ORAL | Status: DC | PRN

## 2013-10-31 NOTE — ED Notes (Addendum)
Pt presents with Bilateral leg pain x1 week, Right flank pain since her last UTI, hot flashes and cold chills x1 day. Pt denies burning/pain with urination, dysuria, abnormal vaginal odor or discharge. Pt states she feels like her urine is too dark, states "I don't drink anything but soft drinks and energy drinks, I never drink water."

## 2013-10-31 NOTE — ED Provider Notes (Signed)
Medical screening examination/treatment/procedure(s) were performed by non-physician practitioner and as supervising physician I was immediately available for consultation/collaboration.   EKG Interpretation   Date/Time:  Thursday October 31 2013 11:52:13 EDT Ventricular Rate:  70 PR Interval:  123 QRS Duration: 69 QT Interval:  393 QTC Calculation: 424 R Axis:   80 Text Interpretation:  Sinus rhythm Left ventricular hypertrophy No  significant change since last tracing Confirmed by Deretha EmoryZACKOWSKI  MD, Mega Kinkade  ([id number]) on 10/31/2013 12:25:26 PM        Shelda JakesScott W. Macyn Shropshire, MD 10/31/13 1340

## 2013-10-31 NOTE — ED Provider Notes (Signed)
CSN: [id number]     Arrival date & time 10/31/13  0941 History   First MD Initiated Contact with Patient 10/31/13 1119     Chief Complaint  Patient presents with  . Flank Pain  . Leg Pain     (Consider location/radiation/quality/duration/timing/severity/associated sxs/prior Treatment) HPI  38 year old female with no significant past medical history, surgical history of cesarean section who presents complaining of bilateral leg pain in right flank pain. 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 She also works at [employer] and is on her feet a lot. She does heavy lifting on a regular basis. For the past week she noticed that she's having increased pain to both of her knees right greater than left. She described pain as a sharp sensation, shooting down to her calf worsened when she stands for long period of time when she moves around and improves with rest. When right knee bothers her she usually uses her left knee to compensate and subsequently having pain at the left knee as well. She tried taking ibuprofen with some relief but is concerned due to the worsening symptoms. Yesterday she also experiencing pain to her right flank. Describe it as a sharp sensation, persistent, with associated night sweats. She has history of UTI before but has no urinary symptoms, no hematuria, no numbness or weakness. She also denies having chest pain, shortness of breath, nausea vomiting diarrhea. No prior history of PE or DVT, does not take any birth control pill, recent surgery, no prolonged bed rest, no long trip, no unilateral leg swelling. Patient does not have a primary care provider.  History reviewed. No pertinent past medical history. Past Surgical History  Procedure Laterality Date  . Cesarean section     No family history on file. History  Substance Use Topics  . Smoking status: Current Every Day Smoker -- 1.00 packs/day    Types: Cigarettes  . Smokeless tobacco: Not  on file  . Alcohol Use: No   OB History   Grav Para Term Preterm Abortions TAB SAB Ect Mult Living                 Review of Systems  All other systems reviewed and are negative.     Allergies  Review of patient's allergies indicates no known allergies.  Home Medications   Current Outpatient Rx  Name  Route  Sig  Dispense  Refill  . doxycycline (VIBRAMYCIN) 100 MG capsule   Oral   Take 1 capsule (100 mg total) by mouth 2 (two) times daily.   20 capsule   0   . ibuprofen (ADVIL,MOTRIN) 200 MG tablet   Oral   Take 400 mg by mouth every 6 (six) hours as needed for fever or moderate pain.         . Multiple Vitamins-Calcium (ONE-A-DAY WOMENS PO)   Oral   Take 1 tablet by mouth daily.          BP 142/86  Pulse 62  Temp(Src) 98.7 F (37.1 C) (Oral)  Resp 17  SpO2 100%  LMP 10/12/2013 Physical Exam  Nursing note and vitals reviewed. Constitutional: She is oriented to person, place, and time. She appears well-developed and well-nourished. No distress.  Awake, alert, nontoxic appearance  HENT:  Head: Atraumatic.  Mouth/Throat: Oropharynx is clear and moist.  Eyes: Conjunctivae are normal. Right eye exhibits no discharge. Left eye exhibits no discharge.  Neck: Normal range of motion. Neck supple.  Cardiovascular:  Normal rate, regular rhythm and intact distal pulses.   Pulmonary/Chest: Effort normal. No respiratory distress. She exhibits no tenderness.  Abdominal: Soft. There is no tenderness. There is no rebound.  Musculoskeletal: Normal range of motion. She exhibits tenderness (patient has no CVA tenderness but does have right paralumbar tenderness with percussion. No overlying skin changes.). She exhibits no edema.  ROM appears intact, no obvious focal weakness  BLE: no palpable cords, erythema, edema, negative Homan sign  No focal point tenderness throughout BLE.  Hips/knees/ankles with FROM.    Neurological: She is alert and oriented to person, place, and  time.  Mental status and motor strength appears intact  Skin: No rash noted.  Psychiatric: She has a normal mood and affect.    ED Course  Procedures (including critical care time)  12:18 PM Patient presents complaining of bilateral lower leg pain especially knee pain. She has no significant finding to suggest a DVT. I suspect this is likely arthritis. She has no significant risk factor for PE or DVT.  Patient also has pain to the right paralumbar region. Will check UA however pt doesn't have any UTI sxs.   1:35 PM UA demonstrates evidence to suggest UTI.  Will treat with cipro.  Pain medication also provided along with resources for outpt f/u.  Return precaution discussed    Labs Review Labs Reviewed  CBC WITH DIFFERENTIAL - Abnormal; Notable for the following:    WBC 11.5 (*)    MCH 35.0 (*)    MCHC 36.3 (*)    Neutro Abs 8.2 (*)    Monocytes Relative 14 (*)    Monocytes Absolute 1.6 (*)    All other components within normal limits  COMPREHENSIVE METABOLIC PANEL - Abnormal; Notable for the following:    Glucose, Bld 106 (*)    All other components within normal limits  URINALYSIS, ROUTINE W REFLEX MICROSCOPIC - Abnormal; Notable for the following:    Color, Urine AMBER (*)    APPearance HAZY (*)    Hgb urine dipstick SMALL (*)    Ketones, ur 15 (*)    Protein, ur 30 (*)    Leukocytes, UA MODERATE (*)    All other components within normal limits  URINE MICROSCOPIC-ADD ON - Abnormal; Notable for the following:    Squamous Epithelial / LPF MANY (*)    Bacteria, UA FEW (*)    All other components within normal limits  URINE CULTURE  POC URINE PREG, ED   Imaging Review No results found.   EKG Interpretation None      MDM   Final diagnoses:  Pyelonephritis  Leg pain, bilateral    BP 142/86  Pulse 62  Temp(Src) 98.7 F (37.1 C) (Oral)  Resp 17  SpO2 100%  LMP 10/12/2013  I have reviewed nursing notes and vital signs. I reviewed available  ER/hospitalization records thought the EMR     Fayrene Helper, [place] 10/31/13 1335

## 2013-10-31 NOTE — ED Notes (Addendum)
PT c/o bil leg pain x 1 week and flank pain and hot/chill flashes since yesterday.  States same pain to flank when she had a UTI, but denies urinary s/s.  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  Pt took 2 docycicline pills which she states alleviated some of the pain.

## 2013-10-31 NOTE — ED Notes (Addendum)
Pt is very upset due to no pain medications or ABX given while here in ED, pt states "I should have been given something IV for pain and treated with ABX through my IV."  Pt requesting a work note for missing work on  10/30/2013 and 10/31/2013, pt explained we can give her a work note for today and to return to work tomorrow but unfortunately we are not able to write a work note for yesterday

## 2013-10-31 NOTE — ED Notes (Signed)
Pt discharge home with all belongings, pt alert and ambulatory ipon discharge, 2 new RX prescribed, pt verbalizes understanding of discharge instructions, pt drove self home, no narcotics given in ED

## 2013-10-31 NOTE — ED Notes (Signed)
Pt refused to sign discharge paper work or allow this RN to get a set of discharge VS

## 2013-10-31 NOTE — Discharge Instructions (Signed)
Pyelonephritis, Adult Pyelonephritis is a kidney infection. In general, there are 2 main types of pyelonephritis:  Infections that come on quickly without any warning (acute pyelonephritis).  Infections that persist for a long period of time (chronic pyelonephritis). CAUSES  Two main causes of pyelonephritis are:  Bacteria traveling from the bladder to the kidney. This is a problem especially in pregnant women. The urine in the bladder can become filled with bacteria from multiple causes, including:  Inflammation of the prostate gland (prostatitis).  Sexual intercourse in females.  Bladder infection (cystitis).  Bacteria traveling from the bloodstream to the tissue part of the kidney. Problems that may increase your risk of getting a kidney infection include:  Diabetes.  Kidney stones or bladder stones.  Cancer.  Catheters placed in the bladder.  Other abnormalities of the kidney or ureter. SYMPTOMS   Abdominal pain.  Pain in the side or flank area.  Fever.  Chills.  Upset stomach.  Blood in the urine (dark urine).  Frequent urination.  Strong or persistent urge to urinate.  Burning or stinging when urinating. DIAGNOSIS  Your caregiver may diagnose your kidney infection based on your symptoms. A urine sample may also be taken. TREATMENT  In general, treatment depends on how severe the infection is.   If the infection is mild and caught early, your caregiver may treat you with oral antibiotics and send you home.  If the infection is more severe, the bacteria may have gotten into the bloodstream. This will require intravenous (IV) antibiotics and a hospital stay. Symptoms may include:  High fever.  Severe flank pain.  Shaking chills.  Even after a hospital stay, your caregiver may require you to be on oral antibiotics for a period of time.  Other treatments may be required depending upon the cause of the infection. HOME CARE INSTRUCTIONS   Take your  antibiotics as directed. Finish them even if you start to feel better.  Make an appointment to have your urine checked to make sure the infection is gone.  Drink enough fluids to keep your urine clear or pale yellow.  Take medicines for the bladder if you have urgency and frequency of urination as directed by your caregiver. SEEK IMMEDIATE MEDICAL CARE IF:   You have a fever or persistent symptoms for more than 2-3 days.  You have a fever and your symptoms suddenly get worse.  You are unable to take your antibiotics or fluids.  You develop shaking chills.  You experience extreme weakness or fainting.  There is no improvement after 2 days of treatment. MAKE SURE YOU:  Understand these instructions.  Will watch your condition.  Will get help right away if you are not doing well or get worse. Document Released: 07/11/2005 Document Revised: 01/10/2012 Document Reviewed: 12/15/2010 [facility] Patient Information 2014 [place], [place].  Musculoskeletal Pain Musculoskeletal pain is muscle and boney aches and pains. These pains can occur in any part of the body. Your caregiver may treat you without knowing the cause of the pain. They may treat you if blood or urine tests, X-rays, and other tests were normal.  CAUSES There is often not a definite cause or reason for these pains. These pains may be caused by a type of germ (virus). The discomfort may also come from overuse. Overuse includes working out too hard when your body is not fit. Boney aches also come from weather changes. Bone is sensitive to atmospheric pressure changes. HOME CARE INSTRUCTIONS   Ask when your test results  will be ready. Make sure you get your test results.  Only take over-the-counter or prescription medicines for pain, discomfort, or fever as directed by your caregiver. If you were given medications for your condition, do not drive, operate machinery or power tools, or sign legal documents for 24 hours. Do not  drink alcohol. Do not take sleeping pills or other medications that may interfere with treatment.  Continue all activities unless the activities cause more pain. When the pain lessens, slowly resume normal activities. Gradually increase the intensity and duration of the activities or exercise.  During periods of severe pain, bed rest may be helpful. Lay or sit in any position that is comfortable.  Putting ice on the injured area.  Put ice in a bag.  Place a towel between your skin and the bag.  Leave the ice on for 15 to 20 minutes, 3 to 4 times a day.  Follow up with your caregiver for continued problems and no reason can be found for the pain. If the pain becomes worse or does not go away, it may be necessary to repeat tests or do additional testing. Your caregiver may need to look further for a possible cause. SEEK IMMEDIATE MEDICAL CARE IF:  You have pain that is getting worse and is not relieved by medications.  You develop chest pain that is associated with shortness or breath, sweating, feeling sick to your stomach (nauseous), or throw up (vomit).  Your pain becomes localized to the abdomen.  You develop any new symptoms that seem different or that concern you. MAKE SURE YOU:   Understand these instructions.  Will watch your condition.  Will get help right away if you are not doing well or get worse. Document Released: 07/11/2005 Document Revised: 10/03/2011 Document Reviewed: 03/15/2013 [facility] Patient Information 2014 [place].   Emergency Department Resource Guide 1) Find a Doctor and Pay Out of Pocket Although you won't have to find out who is covered by your insurance plan, it is a good idea to ask around and get recommendations. You will then need to call the office and see if the doctor you have chosen will accept you as a new patient and what types of options they offer for patients who are self-pay. Some doctors offer discounts or will set up payment plans  for their patients who do not have insurance, but you will need to ask so you aren't surprised when you get to your appointment.  2) Contact Your Local Health Department Not all health departments have doctors that can see patients for sick visits, but many do, so it is worth a call to see if yours does. If you don't know where your local health department is, you can check in your phone book. The CDC also has a tool to help you locate your state's health department, and many state websites also have listings of all of their local health departments.  3) Find a Walk-in Clinic If your illness is not likely to be very severe or complicated, you may want to try a walk in clinic. These are popping up all over the country in pharmacies, drugstores, and shopping centers. They're usually staffed by nurse practitioners or physician assistants that have been trained to treat common illnesses and complaints. They're usually fairly quick and inexpensive. However, if you have serious medical issues or chronic medical problems, these are probably not your best option.  No Primary Care Doctor: - Call Health Connect at  [phone] - they can  help you locate a primary care doctor that  accepts your insurance, provides certain services, etc. - Physician Referral Service- [phone]  Chronic Pain Problems: Organization         Address  Phone   Notes  [facility]  [phone] Patients need to be referred by their primary care doctor.   Medication Assistance: Organization         Address  Phone   Notes  [facility] Medication [facility] [address] [place], [place] [address] [phone] --Must be a resident of [facility] -- Must have NO insurance coverage whatsoever (no Medicaid/ Medicare, etc.) -- The pt. MUST have a primary care doctor that directs their care regularly and follows them in the community   [facility]  [phone]   [employer]  [phone]    Agencies that provide inexpensive medical care: Organization         Address  Phone   Notes  [facility]  [phone]   [facility]    [phone]   [facility] [address] [place], [place] [address] [phone]   [facility] [address] [place] [phone]   Planned Parenthood    [phone]   [facility]    [phone]   Community Health and [facility]  [address], [place] Phone:  [phone], Fax:  [phone] Hours of Operation:  9 am - 6 pm, M-F.  Also accepts Medicaid/Medicare and self-pay.  [facility]  [address], [place] Phone: [phone], Fax: [phone]. Hours of Operation:  8:30 am - 5:30 pm, M-F.  Also accepts Medicaid and self-pay.  [facility] [place] [address], [place] Phone: [phone]   [facility] [address] Natasha Bence [place], [place] [phone], Ext. [phone] Mondays & Thursdays: 7-9 AM.  First 15 patients are seen on a first come, first serve basis.    Medicaid-accepting [facility] Providers:  Organization         Address  Phone   Notes  [facility] [address], [place] [phone] Also accepts self-pay patients.  [facility] [address] Laurell Josephs [place], [place]  [phone]   [facility] [address], [place] [phone]   [facility] [address], [place] [phone]   Renaye Rakers [address], [place]   [phone] Only accepts [place] patients after they have their name applied to their card.   Self-Pay (no insurance) in [facility]:  Organization         Address  Phone   Notes  Sickle Cell Patients, [facility] [address] [place], [place] [phone]   [facility] [address] [place], [place] [phone]   [facility] [place]  [address], [place] [phone]   [facility]/Dr. Osei-Bonsu  [address], [place] or [address], [place] [phone] Phone number for both [place] and [place] locations is the same.  Urgent Medical and [facility] [address], [place] [phone]   [facility] [address], [place] or [address] [phone]   [facility] [address], [place] [phone], phone; [phone], fax Sees patients 1st and 3rd Saturday of every month.  Must not qualify for public or private insurance (i.e. Medicaid, Medicare, [place] Health Choice, Veterans' Benefits)  Household income should be no more than 200% of the poverty level The clinic cannot treat you if you are pregnant or think you are pregnant  Sexually transmitted diseases are not treated at the clinic.    Dental Care: Organization         Address  Phone  Notes  [facility] Department of [facility] [address] [place], [place] [phone] Accepts children up to age 24 who are enrolled in [place] or [place] Health Choice; pregnant women with a Medicaid card; and children who have applied for Medicaid or [place] Health Choice, but were declined, whose parents can pay a reduced fee at time of service.  [facility] Department of [facility]  [address], [place] [phone] Accepts children up to age 67 who are enrolled in [place] or [place] Health Choice; pregnant women with a Medicaid card; and children who have applied for Medicaid or [place] Health Choice, but were declined, whose parents can pay a reduced fee at time of service.  [facility]  [address] [place], [place] [phone] Patients are  seen by appointment only. Walk-ins are not accepted. [facility] will see patients 17 years of age and older. Monday - Tuesday (8am-5pm) Most Wednesdays (8:30-5pm) $30 per visit, cash only  [facility] Adult Dental Access [facility]  [address], [facility] [phone] Patients are seen by appointment only. Walk-ins are not accepted. [facility] will see patients 92 years of age and older. One Wednesday Evening (Monthly: Volunteer Based).  $30 per visit, cash only  [employer]  [phone] for adults; Children under age 73, call Graduate Pediatric Dentistry at [phone]. Children aged 65-14, please call [phone] to request a pediatric application.  Dental services are provided in all areas of dental care including fillings, crowns and bridges, complete and partial dentures, implants, gum treatment, root canals, and extractions. Preventive care is also provided. Treatment is provided to both adults and children. Patients are selected via a lottery and there is often a waiting list.   [facility] [address], [place]  [phone] [email or web address]   [facility] [address] [place], [place] [phone], Ext. [phone] Second and Fourth Thursday of each month, opens at 6:30 AM; Clinic ends at 9 AM.  Patients are seen on a first-come first-served basis, and a limited number are seen during each clinic.   [facility]  [address] Ether Griffins [place], [place] [phone]   Eligibility Requirements You must have lived in [place], [place], or [place] counties for at least the last three months.   You cannot be eligible for state or federal sponsored [place], including [employer], [place], or [employer].   You generally cannot be eligible for healthcare insurance through your employer.    How to apply: Eligibility screenings are held every Tuesday and Wednesday afternoon from 1:00 pm until 4:00  pm. You do not need an appointment for the interview!  [facility] [place] [facility] [address], [place], [place] [phone]   [facility]  [phone]   [facility] Department  [phone]   [facility]  [phone]    Behavioral Health Resources in the Community: Intensive Outpatient Programs Organization         Address  Phone  Notes  [facility] [address], [place], [place] [phone]   [facility] [address], [place], [place] [phone]   [facility] [address], [place], [place]  [phone]   [facility] [address],  [place], [place] [phone] or [phone]   Substance Abuse Resources Organization         Address  Phone  Notes  [facility]  [phone]   [facility]  [phone]   [facility]  [phone]   [facility]  [phone]   Residential & Outpatient Substance Abuse Program  [phone]   Psychological Services Organization         Address  Phone  Notes  [facility]  [phone]   [facility]  [phone]- [phone]   [facility] [address], [place] [phone] or [phone]    Mobile Crisis Teams Organization         Address  Phone  Notes  [facility], Mobile Crisis Care Unit  [phone]   [facility]  [address] [place], [place] [phone]   Doristine LocksSharon DeEsch [address] [place] [phone]    Self-Help/Support Groups Organization         Address  Phone             Notes  [facility] [place] - variety of support groups  336- [phone] Call for more information  Narcotics Anonymous (NA), Caring [facility] [address], [place]  2 meetings at this location   Statisticianesidential Treatment Programs Organization          Address  Phone  Notes  [facility] [address],    [place]  [phone]   [facility]  [address], [place] [phone], [place], [place] [phone]   [facility] [address], [place] [phone] Admissions: 8am-3pm M-F  [facility] [address]    [place], [place] [phone]   [facility] [address], [place], [place] [phone]   [facility] [address]  [place], [place] [phone]   [facility] - Intensive Outpatient [address], [place], [place] [phone]   [facility] ([facility] [address]  [place], [place] [phone] or [phone]   [facility] ([facility]) [address] [place], [place] [phone] Accepts Medicaid  [facility] [address]  [place] [phone] Substance Abuse/Addiction Treatment   [facility] Resources Organization         Address  Phone  Notes  [facility]  [phone]) [phone]   Angie FavaJulie Brannon, PhD [address], [address] [place], [place]   [phone] or [phone]) [phone]   [facility]   [address] [place], [place] [phone]   [facility] [address], [place], [place] [phone] Insurance/Medicaid/sponsorship through [employer] [facility]  [facility] [address]                                    [place], [place] (  [phone] Therapy/tele-psych/case  [facility] [address]   [place], [place] [phone]    Dr. Lolly Mustache  [phone]   [facility] [place]  [employer] [facility] Dept. 1) [address], [place] 2) [address], [place] 3)  [address], [place] [phone]  [phone]   [facility] Child Abuse Hotline 2765537432 or [phone] (After Hours)

## 2013-11-02 LAB — URINE CULTURE
Colony Count: 85000
SPECIAL REQUESTS: NORMAL

## 2013-11-08 ENCOUNTER — Encounter (HOSPITAL_COMMUNITY): Payer: Self-pay | Admitting: Emergency Medicine

## 2013-11-08 ENCOUNTER — Emergency Department (HOSPITAL_COMMUNITY)
Admission: EM | Admit: 2013-11-08 | Discharge: 2013-11-08 | Disposition: A | Discharge status: Home / Self Care | Payer: Medicaid Other | Attending: Emergency Medicine | Admitting: Emergency Medicine

## 2013-11-08 DIAGNOSIS — K006 Disturbances in tooth eruption: Secondary | ICD-10-CM | POA: Insufficient documentation

## 2013-11-08 DIAGNOSIS — Z87440 Personal history of urinary (tract) infections: Secondary | ICD-10-CM | POA: Insufficient documentation

## 2013-11-08 DIAGNOSIS — Z792 Long term (current) use of antibiotics: Secondary | ICD-10-CM | POA: Insufficient documentation

## 2013-11-08 DIAGNOSIS — F17200 Nicotine dependence, unspecified, uncomplicated: Secondary | ICD-10-CM | POA: Insufficient documentation

## 2013-11-08 DIAGNOSIS — K029 Dental caries, unspecified: Secondary | ICD-10-CM | POA: Insufficient documentation

## 2013-11-08 DIAGNOSIS — K047 Periapical abscess without sinus: Secondary | ICD-10-CM | POA: Insufficient documentation

## 2013-11-08 MED ORDER — HYDROCODONE-ACETAMINOPHEN 5-325 MG PO TABS
2.0000 | ORAL_TABLET | Freq: Once | ORAL | Status: AC
  Administered 2013-11-08: 2 via ORAL
  Filled 2013-11-08: qty 2

## 2013-11-08 MED ORDER — NAPROXEN 500 MG PO TABS: 500.0000 mg | ORAL_TABLET | Freq: Two times a day (BID) | ORAL | Status: DC

## 2013-11-08 MED ORDER — AMOXICILLIN 500 MG PO CAPS
500.0000 mg | ORAL_CAPSULE | Freq: Once | ORAL | Status: AC
  Administered 2013-11-08: 500 mg via ORAL
  Filled 2013-11-08: qty 1

## 2013-11-08 MED ORDER — HYDROCODONE-ACETAMINOPHEN 5-325 MG PO TABS: 1.0000 | ORAL_TABLET | ORAL | Status: DC | PRN

## 2013-11-08 MED ORDER — AMOXICILLIN 500 MG PO CAPS: 500.0000 mg | ORAL_CAPSULE | Freq: Three times a day (TID) | ORAL | Status: DC

## 2013-11-08 NOTE — ED Provider Notes (Signed)
Medical screening examination/treatment/procedure(s) were performed by non-physician practitioner and as supervising physician I was immediately available for consultation/collaboration.   EKG Interpretation None        Lashaunda Schild, MD 11/08/13 2141 

## 2013-11-08 NOTE — ED Notes (Addendum)
Pt reports that two months ago she had a dental fracture to the left, posterior, mandibular arch. Pt reports pain at the site worsened today. Pt is A/O x4, in NAD, pt ambulatory to exam room without difficulty, and vitals are WDL. Pt reports currently being on an antibiotic due to a UTI, which was prescribed during [facility].

## 2013-11-08 NOTE — Discharge Instructions (Signed)
Abscessed Tooth °An abscessed tooth is an infection around your tooth. It may be caused by holes or damage to the tooth (cavity) or a dental disease. An abscessed tooth causes mild to very bad pain in and around the tooth. See your dentist right away if you have tooth or gum pain. °HOME CARE °· Take your medicine as told. Finish it even if you start to feel better. °· Do not drive after taking pain medicine. °· Rinse your mouth (gargle) often with salt water (¼ teaspoon salt in 8 ounces of warm water). °· Do not apply heat to the outside of your face. °GET HELP RIGHT AWAY IF:  °· You have a temperature by mouth above 102° F (38.9° C), not controlled by medicine. °· You have chills and a very bad headache. °· You have problems breathing or swallowing. °· Your mouth will not open. °· You develop puffiness (swelling) on the neck or around the eye. °· Your pain is not helped by medicine. °· Your pain is getting worse instead of better. °MAKE SURE YOU:  °· Understand these instructions. °· Will watch your condition. °· Will get help right away if you are not doing well or get worse. °Document Released: 12/28/2007 Document Revised: 10/03/2011 Document Reviewed: 10/19/2010 °ExitCare® Patient Information ©2014 [employer]. ° ° °Emergency Department Resource Guide °1) Find a Doctor and Pay Out of Pocket °Although you won't have to find out who is covered by your insurance plan, it is a good idea to ask around and get recommendations. You will then need to call the office and see if the doctor you have chosen will accept you as a new patient and what types of options they offer for patients who are self-pay. Some doctors offer discounts or will set up payment plans for their patients who do not have insurance, but you will need to ask so you aren't surprised when you get to your appointment. ° °2) Contact Your Local Health Department °Not all health departments have doctors that can see patients for sick visits, but many  do, so it is worth a call to see if yours does. If you don't know where your local health department is, you can check in your phone book. The CDC also has a tool to help you locate your state's health department, and many state websites also have listings of all of their local health departments. ° °3) Find a Walk-in Clinic °If your illness is not likely to be very severe or complicated, you may want to try a walk in clinic. These are popping up all over the country in pharmacies, drugstores, and shopping centers. They're usually staffed by nurse practitioners or physician assistants that have been trained to treat common illnesses and complaints. They're usually fairly quick and inexpensive. However, if you have serious medical issues or chronic medical problems, these are probably not your best option. ° °No Primary Care Doctor: °- Call [facility] at  832-8000 - they can help you locate a primary care doctor that  accepts your insurance, provides certain services, etc. °- Physician Referral Service- 1-800-533-3463 ° °Chronic Pain Problems: °Organization         Address  Phone   Notes  °[facility]  (336) 297-2271 Patients need to be referred by their primary care doctor.  ° °Medication Assistance: °Organization         Address  Phone   Notes  °[place] [facility] [address] °[place], [place] [address] (336) 641-8030 --Must be a resident of [place] °-- Must have NO insurance coverage whatsoever (no Medicaid/ Medicare, etc.) °-- The pt. MUST have a primary care doctor that directs their care regularly and follows them in the community °  °[facility]  (866) 331-1348   °[employer]  (888) 892-1162   ° °Agencies that provide inexpensive medical care: °Organization         Address  Phone   Notes  °[facility]  (336) 832-8035   °[facility]    (336) 832-7272   °[facility] [address] °[place], [place] [address] (336) 832-4777   °[facility] [address], °[place] (336) 271-4999   °[facility]    (336) 373-0678   °[facility]    (336) 272-1050   °[facility] ° [address], [place] Phone:  (336) 832-4444, Fax:  (336) 832-4440 Hours of Operation:  9 am - 6 pm, M-F.  Also accepts Medicaid/Medicare and self-pay.  °[facility] ° [address], [place] Phone: (336) 832-3150, Fax: (336) 832-3151. Hours of Operation:  8:30 am - 5:30 pm, M-F.  Also accepts Medicaid and self-pay.  °[facility] [place] [address], [place] Phone: (336) 878-6027   °[facility] [address], [place], [place] (336)723-1848, Ext. 123 Mondays & Thursdays: 7-9 AM.  First 15 patients are seen on a first come, first serve basis. °  ° °Medicaid-accepting [place] Providers: ° °Organization         Address  Phone   Notes  °[facility] [address], [place] (336) 641-2100 Also accepts self-pay patients.  °[facility] [address], [place] ° (336) 856-9996   °[facility] [address], [place] (336) 288-8857   °[facility] [address] [place] [address], [place] (336) 299-7000   °Veita Bland [address], [place]  ° (336) 373-1557 Only accepts [place] Access Medicaid patients after they have their name applied to their card.  ° °Self-Pay (no insurance) in [place]: ° °Organization         Address  Phone   Notes  °Sickle Cell Patients, [facility] [address], [place] (336) 832-1970   °[facility] [address], [place] (336) 832-4400   ° [facility] ° [address], [place] (336) 992-4800   °[facility]/Dr. Osei-Bonsu ° [address] [place] [address], [place] or  [address], [place] (336) [phone] Phone number for both [place] and [place] locations is the same.  °[facility] [address], [place] (336) 299-0000   °[facility] [address] [place] [address], [place] or [address] (336) 852-7530 °(336) 878-2260   °[facility] [address], [place] (336) 350-1642, phone; (336) 294-5005, fax Sees patients 1st and 3rd Saturday of every month.  Must not qualify for public or private insurance (i.e. Medicaid, Medicare,  Health Choice, Veterans' Benefits) • Household income should be no more than 200% of the poverty level •The clinic cannot treat you if you are pregnant or think you are pregnant • Sexually transmitted diseases are not treated at the clinic.  ° ° °Dental Care: °  Organization         Address  Phone  Notes  °[facility] [facility] [address], [place] (336) 641-6152 Accepts children up to age 21 who are enrolled in Medicaid or [place] Health Choice; pregnant women with a Medicaid card; and children who have applied for Medicaid or [place] Health Choice, but were declined, whose parents can pay a reduced fee at time of service.  °[facility] [place]  [address], [place] (336) 641-7733 Accepts children up to age 21 who are enrolled in Medicaid or [place] Health Choice; pregnant women with a Medicaid card; and children who have applied for Medicaid or [place] Health Choice, but were declined, whose parents can pay a reduced fee at time of service.  °[facility] ° [address], [place] (336) 641-4533 Patients are seen by appointment only. Walk-ins are not accepted. [facility] will see patients 18 years of age and older. °Monday - Tuesday (8am-5pm) °Most Wednesdays (8:30-5pm) °$30 per visit, cash only  °[facility] ° [address], [place] (336) 641-4533 Patients are seen by appointment only. Walk-ins are not accepted. [facility] will see patients 18 years of age and older. °One Wednesday Evening (Monthly: Volunteer Based).  $30 per visit, cash only  °[facility]  (919) 537-3737 for adults; Children under age 4, call Graduate Pediatric Dentistry at (919) [phone]. Children aged 4-14, please call (919) [phone] to request a pediatric application. ° Dental services are provided in all areas of dental care including fillings, crowns and bridges, complete and partial dentures, implants, gum treatment, root canals, and extractions. Preventive care is also provided. Treatment is provided to both adults and children. °Patients are selected via a lottery and there is often a waiting list. °  °[facility] [address], °[place] ° (336) 763-8833 [email or web address] °  °[facility] [address], [place], [place] (336)723-1848, Ext. 123 Second and Fourth Thursday of each month, opens at 6:30 AM; Clinic ends at 9 AM.  Patients are seen on a first-come first-served basis, and a limited number are seen during each clinic.  ° °[facility] ° [address], [place], [place] (336) 723-7904   Eligibility Requirements °You must have lived in [place], [place], or [place] counties for at least the last three months. °  You cannot be eligible for state or federal sponsored healthcare insurance, including Veterans Administration, Medicaid, or Medicare. °  You generally cannot be eligible for healthcare insurance through your employer.  °  How to apply: °Eligibility screenings are held every Tuesday and Wednesday afternoon from 1:00 pm until 4:00 pm. You do not need an appointment for the interview!  °[facility] [address], [place], [place] 336-631-2330   °[facility]  336-342-8273   °[facility]  336-703-3100   °[place] [facility]  336-570-6415   ° °Behavioral Health Resources in the Community: °Intensive Outpatient Programs °Organization         Address  Phone  Notes  °[facility] [address], [place], [place] 336-878-6098   °[facility] [address], [place], [place] 336-832-9800   °[facility] [address], [place],  ° 336-882-2125   °[facility] [address],  °[place],   [place] 1-800-853-5163 or 336-641-4981   °Substance Abuse Resources °Organization         Address  Phone  Notes  °[facility]  336-882-2125   °[facility]  336-784-9470   °[facility]  336-285-9073   °[facility]  336-845-3988   °Residential & Outpatient Substance Abuse Program  1-800-659-3381   °Psychological Services °Organization         Address  Phone  Notes  °[facility]  336- 832-9600   °[facility]  336- 378-7881   °[facility] [address], [place] 1-800-853-5163 or 336-641-4981   ° °Mobile Crisis Teams °Organization         Address  Phone  Notes  °[facility], Mobile Crisis Care Unit  1-877-626-1772   °Assertive °[facility] ° [address] [place], [place] 336-834-9664   °Sharon DeEsch [address] °[place] 336-554-5454   ° °Self-Help/Support Groups °Organization         Address  Phone             Notes  °[facility] - variety of support groups  336- 373-1402 Call for more information  °Narcotics Anonymous (NA), [facility] [address], °[place]  2 meetings at this location  ° °Residential Treatment Programs °Organization         Address  Phone  Notes  °[facility] [address],    °[place]  1-866-801-8205   °[facility] ° [address], [place], [place] 704-293-8524   °[facility] [address], [place] 336-845-3988  Admissions: 8am-3pm M-F  °[facility] [address]    °[place], [place] 336-841-1104   °[facility] [address], [place], [place] 336-379-7146   °[facility] [address]  °[place], [place] 336-285-9073   °[facility] - Intensive Outpatient [address], [place], [place] 336-852-3033   °[facility] [address]  °[place], [place] 1-877-615-2722 or 336-784-9470   °[facility] [address] [place], [place] 336-227-7417 Accepts Medicaid  °[facility] [address]  °[place] 1-800-659-3381 Substance Abuse/Addiction Treatment  ° °[place] Behavioral Health Resources °Organization         Address  Phone  Notes  °[facility]  (888) 581-9988   °Julie Brannon, PhD [address] [place], [place]   (336) 349-5553 or (336) 951-0000   ° Behavioral   [address] °[place], [place] (336) 349-4454   °[facility] [address], [place], [place] (336) 342-8316 Insurance/Medicaid/sponsorship through [facility]  °[facility] [address]                                    [place], [place] (336) 342-8316 Therapy/tele-psych/case  °[facility] [address]  ° [place], [place] (336) 349-2233    °Dr. Arfeen  (336) 349-4544   °[facility] [place]  [employer] [place] [facility] 1) [address], [place] °2) [address], [place] °3)  [address], [place] (336) 349-3220 °(336) 342-7768 ° °(336) 342-8140   °[place] [facility] (336) 342-1394 or (336) 342-3537 (After Hours)    ° ° ° ° °

## 2013-11-08 NOTE — ED Provider Notes (Signed)
CSN: [id number]     Arrival date & time 11/08/13  1954 History  This chart was scribed for non-physician practitioner Antony MaduraKelly Binta Statzer, working with Gwyneth SproutWhitney Plunkett, MD by Carl Bestelina Holson, ED Scribe. This patient was seen in room WTR5/WTR5 and the patient's care was started at 8:28 PM.    Chief Complaint  Patient presents with  . Dental Pain    The history is provided by the patient. No language interpreter was used.   HPI Comments: [name] is a 38 y.o. female who presents to the Emergency Department complaining of constant throbbing, sharp, lower left sided dental pain that started this morning.  The patient states that her tooth started decaying two months ago.  The patient denies having insurance or a dentist.  She denies any recent trauma to her mouth.  The patient denies oral discharge, fever, or bleeding as associated symptoms.  She states that she has been taking Ibuprofen with no relief to her symptoms.  The patient is currently taking Cipro for a UTI that she was recently diagnosed with.  She states that she has two more days worth of antibiotics.     No past medical history on file. Past Surgical History  Procedure Laterality Date  . Cesarean section     No family history on file. History  Substance Use Topics  . Smoking status: Current Every Day Smoker -- 1.00 packs/day    Types: Cigarettes  . Smokeless tobacco: Not on file  . Alcohol Use: No   OB History   Grav Para Term Preterm Abortions TAB SAB Ect Mult Living                  Review of Systems  Constitutional: Negative for fever.  HENT: Positive for dental problem.   All other systems reviewed and are negative.    Allergies  Review of patient's allergies indicates no known allergies.  Home Medications   Prior to Admission medications   Medication Sig Start Date End Date Taking? Authorizing Provider  ciprofloxacin (CIPRO) 500 MG tablet Take 1 tablet (500 mg total) by mouth 2 (two) times daily. One po  bid x 7 days 10/31/13   Fayrene HelperBowie Tran, PA-C  doxycycline (VIBRAMYCIN) 100 MG capsule Take 1 capsule (100 mg total) by mouth 2 (two) times daily. 05/26/13   Irish EldersKelly Walker, NP  HYDROcodone-acetaminophen (NORCO/VICODIN) 5-325 MG per tablet Take 2 tablets by mouth every 4 (four) hours as needed. 10/31/13   Fayrene HelperBowie Tran, PA-C  ibuprofen (ADVIL,MOTRIN) 200 MG tablet Take 400 mg by mouth every 6 (six) hours as needed for fever or moderate pain.    Historical Provider, MD  Multiple Vitamins-Calcium (ONE-A-DAY WOMENS PO) Take 1 tablet by mouth daily.    Historical Provider, MD   Triage Vitals: BP 119/85  Pulse 72  Temp(Src) 98.3 F (36.8 C) (Oral)  Resp 16  SpO2 100%  LMP 11/04/2013  Physical Exam  Nursing note and vitals reviewed. Constitutional: She is oriented to person, place, and time. She appears well-developed and well-nourished. No distress.  HENT:  Head: Normocephalic and atraumatic.  Right Ear: External ear normal. No mastoid tenderness.  Left Ear: External ear normal. No mastoid tenderness.  Nose: Nose normal.  Mouth/Throat: Uvula is midline, oropharynx is clear and moist and mucous membranes are normal. No oral lesions. No trismus in the jaw. Abnormal dentition. Dental abscesses and dental caries present. No uvula swelling. No posterior oropharyngeal erythema.    Oropharynx clear. Patient tolerating secretions without difficulty or  drooling. Mild swelling of L buccal mucosa.  Eyes: Conjunctivae and EOM are normal. Pupils are equal, round, and reactive to light. No scleral icterus.  Neck: Normal range of motion. Neck supple.  Cardiovascular: Normal rate, regular rhythm and normal heart sounds.   Pulmonary/Chest: Effort normal and breath sounds normal. No stridor. No respiratory distress. She has no wheezes. She has no rales.  Musculoskeletal: Normal range of motion.  Neurological: She is alert and oriented to person, place, and time.  Skin: Skin is warm and dry. No rash noted. She is not  diaphoretic. No erythema. No pallor.  Psychiatric: She has a normal mood and affect. Her behavior is normal.    ED Course  Procedures (including critical care time)  DIAGNOSTIC STUDIES: Oxygen Saturation is 100% on room air, normal by my interpretation.    COORDINATION OF CARE: 8:32 PM- Advised the patient to finish her Cipro and take Amoxicillin for her tooth three times a day.  Advised the patient to visit the free dental clinic tomorrow.  The patient agreed to the treatment plan.    Labs Review Labs Reviewed - No data to display  Imaging Review No results found.   EKG Interpretation None      MDM   Final diagnoses:  Dental abscess    Patient with toothache x 2 days. Physical exam findings consistent with uncomplicated dental abscess. Oropharynx clear. Patient tolerating secretions without difficulty or drooling. No trismus, facial swelling, or stridor. No red flags or signs concerning for Ludwig's angina or spread of infection.  Will treat with Amoxicillin and pain medicine. Urged patient to follow-up with dentist. Resource guide and information on the free dental clinic provided. Return precautions discussed and patient agreeable to plan with no unaddressed concerns.  I personally performed the services described in this documentation, which was scribed in my presence. The recorded information has been reviewed and is accurate.   Filed Vitals:   11/08/13 2006  BP: 119/85  Pulse: 72  Temp: 98.3 F (36.8 C)  TempSrc: Oral  Resp: 16  SpO2: 100%     Antony MaduraKelly Jennavie Martinek, PA-C 11/08/13 2112

## 2014-02-22 ENCOUNTER — Emergency Department (HOSPITAL_COMMUNITY)
Admission: EM | Admit: 2014-02-22 | Discharge: 2014-02-22 | Disposition: A | Discharge status: Home / Self Care | Payer: Medicaid Other | Attending: Emergency Medicine | Admitting: Emergency Medicine

## 2014-02-22 ENCOUNTER — Encounter (HOSPITAL_COMMUNITY): Payer: Self-pay | Admitting: Emergency Medicine

## 2014-02-22 DIAGNOSIS — L0201 Cutaneous abscess of face: Secondary | ICD-10-CM

## 2014-02-22 DIAGNOSIS — R51 Headache: Secondary | ICD-10-CM | POA: Insufficient documentation

## 2014-02-22 DIAGNOSIS — L03211 Cellulitis of face: Principal | ICD-10-CM

## 2014-02-22 DIAGNOSIS — F17200 Nicotine dependence, unspecified, uncomplicated: Secondary | ICD-10-CM | POA: Insufficient documentation

## 2014-02-22 MED ORDER — SULFAMETHOXAZOLE-TRIMETHOPRIM 800-160 MG PO TABS: 1.0000 | ORAL_TABLET | Freq: Two times a day (BID) | ORAL | Status: AC

## 2014-02-22 MED ORDER — SULFAMETHOXAZOLE-TMP DS 800-160 MG PO TABS
1.0000 | ORAL_TABLET | Freq: Once | ORAL | Status: AC
  Administered 2014-02-22: 1 via ORAL
  Filled 2014-02-22: qty 1

## 2014-02-22 NOTE — ED Provider Notes (Signed)
CSN: [id number]     Arrival date & time 02/22/14  2017 History  This chart was scribed for non-physician practitioner working with Ethelda Chick, MD by Elveria Rising, ED Scribe. This patient was seen in room WTR9/WTR9 and the patient's care was started at 9:32 PM.   Chief Complaint  Patient presents with  . Facial Swelling    abcess     The history is provided by the patient. No language interpreter was used.   HPI Comments: [name] is a 38 y.o. female who presents to the Emergency Department complaining of facial abscess ongoing for 1.5 week. Patient reports treatment with hot compress last night, but denies improvement. Instead patient reports significant growth of her abscess overnight. Patient reports associated headache, which was relieved by ibuprofen. Patient denies fever, chills or sweats. Patient denies chronic health issues.  Patient reports a previous facial abscess under her left eye and that never drained or healed completely.   History reviewed. No pertinent past medical history. Past Surgical History  Procedure Laterality Date  . Cesarean section    . Tubal ligation     History reviewed. No pertinent family history. History  Substance Use Topics  . Smoking status: Current Every Day Smoker -- 1.00 packs/day for 15 years    Types: Cigarettes  . Smokeless tobacco: Never Used  . Alcohol Use: No   OB History   Grav Para Term Preterm Abortions TAB SAB Ect Mult Living                 Review of Systems  Constitutional: Negative for fever, chills and diaphoresis.  HENT: Positive for facial swelling.   Skin:       Abscess   Neurological: Positive for headaches.  All other systems reviewed and are negative.     Allergies  Review of patient's allergies indicates no known allergies.  Home Medications   Prior to Admission medications   Medication Sig Start Date End Date Taking? Authorizing Provider  ibuprofen (ADVIL,MOTRIN) 200 MG tablet Take 400 mg by  mouth every 6 (six) hours as needed for fever or moderate pain.   Yes Historical Provider, MD   Triage Vitals: BP 126/79  Pulse 86  Temp(Src) 98.5 F (36.9 C) (Oral)  Resp 18  SpO2 100%  LMP 01/24/2014 Physical Exam  Nursing note and vitals reviewed. Constitutional: She is oriented to person, place, and time. She appears well-developed and well-nourished. No distress.  HENT:  Head: Normocephalic and atraumatic.  Mouth/Throat: Oropharynx is clear and moist.  Normal dentition.  Eyes: EOM are normal.  Neck: Normal range of motion. Neck supple.  Cardiovascular: Normal rate.   Pulmonary/Chest: Effort normal. No respiratory distress.  Musculoskeletal: Normal range of motion.  Neurological: She is alert and oriented to person, place, and time.  Skin: Skin is warm and dry.  2 cm erythematous nodule with pointing and slight pustule to the right cheek of the face. No bleeding or drainage. Area is tender.  Psychiatric: She has a normal mood and affect. Her behavior is normal.    ED Course  Procedures   COORDINATION OF CARE: 9:39 PM- Patient agrees to I&D. Discussed treatment plan with patient at bedside and patient agreed to plan.   INCISION AND DRAINAGE Performed by: Angus Seller Consent: Verbal consent obtained. Risks and benefits: risks, benefits and alternatives were discussed Type: abscess  Body area: Right face and cheek  Anesthesia: None  Incision was made with a 18-gauge needle.  Complexity: Simple  Drainage: purulent  Drainage amount: 1cc  Packing material: None   Patient tolerance: Patient tolerated the procedure well with no immediate complications.    MDM   Final diagnoses:  Abscess of face    I personally performed the services described in this documentation, which was scribed in my presence. The recorded information has been reviewed and is accurate.    Angus Sellereter S Galan Ghee, PA-C 02/22/14 2209

## 2014-02-22 NOTE — ED Notes (Signed)
Patient is alert and oriented x3.  She was given DC instructions and follow up visit instructions.  Patient gave verbal understanding. She was DC ambulatory under her own power to home.  V/S stable.  He was not showing any signs of distress on DC 

## 2014-02-22 NOTE — ED Notes (Signed)
Patient is alert and oriented x3.  She is complaining of a facial abcess that started a week ago. Patient placed a hot compress on it last night and the abcess grew.  Patient states that the pain is 10 of 10.

## 2014-02-22 NOTE — ED Provider Notes (Signed)
Medical screening examination/treatment/procedure(s) were performed by non-physician practitioner and as supervising physician I was immediately available for consultation/collaboration.   EKG Interpretation None       Ethelda ChickMartha K Linker, MD 02/22/14 2210

## 2014-02-22 NOTE — Discharge Instructions (Signed)
Continue to use warm compresses over or skin infection for 20-30 minutes. Return for any changing or worsening symptoms.   Abscess An abscess (boil or furuncle) is an infected area on or under the skin. This area is filled with yellowish-white fluid (pus) and other material (debris). HOME CARE   Only take medicines as told by your doctor.  If you were given antibiotic medicine, take it as directed. Finish the medicine even if you start to feel better.  If gauze is used, follow your doctor's directions for changing the gauze.  To avoid spreading the infection:  Keep your abscess covered with a bandage.  Wash your hands well.  Do not share personal care items, towels, or whirlpools with others.  Avoid skin contact with others.  Keep your skin and clothes clean around the abscess.  Keep all doctor visits as told. GET HELP RIGHT AWAY IF:   You have more pain, puffiness (swelling), or redness in the wound site.  You have more fluid or blood coming from the wound site.  You have muscle aches, chills, or you feel sick.  You have a fever. MAKE SURE YOU:   Understand these instructions.  Will watch your condition.  Will get help right away if you are not doing well or get worse. Document Released: 12/28/2007 Document Revised: 01/10/2012 Document Reviewed: 09/23/2011 [facility] Patient Information 2015 [employer], [place]. This information is not intended to replace advice given to you by your health care provider. Make sure you discuss any questions you have with your health care provider.

## 2014-10-08 ENCOUNTER — Encounter (HOSPITAL_COMMUNITY): Payer: Self-pay

## 2014-10-08 ENCOUNTER — Emergency Department (HOSPITAL_COMMUNITY)
Admission: EM | Admit: 2014-10-08 | Discharge: 2014-10-08 | Disposition: A | Discharge status: Home / Self Care | Payer: Medicaid Other | Attending: Emergency Medicine | Admitting: Emergency Medicine

## 2014-10-08 DIAGNOSIS — Z720 Tobacco use: Secondary | ICD-10-CM | POA: Insufficient documentation

## 2014-10-08 DIAGNOSIS — L089 Local infection of the skin and subcutaneous tissue, unspecified: Secondary | ICD-10-CM | POA: Insufficient documentation

## 2014-10-08 DIAGNOSIS — H578 Other specified disorders of eye and adnexa: Secondary | ICD-10-CM | POA: Insufficient documentation

## 2014-10-08 MED ORDER — SULFAMETHOXAZOLE-TRIMETHOPRIM 800-160 MG PO TABS: 1.0000 | ORAL_TABLET | Freq: Two times a day (BID) | ORAL | Status: DC

## 2014-10-08 NOTE — ED Provider Notes (Signed)
CSN: [id number]     Arrival date & time 10/08/14  1743 History  This chart was scribed for non-physician practitioner, Renne Crigler, working with Mancel Bale, MD by Richarda Overlie, ED Scribe. This patient was seen in room TR07C/TR07C and the patient's care was started at 6:52 PM.   Chief Complaint  Patient presents with  . Facial Swelling   HPI HPI Comments: [name] is a 39 y.o. female who presents to the Emergency Department complaining of a bump on the right side of her upper nose that started about 3 weeks ago. It enlarged 3 days ago and became red. Pt states that she sometimes gets "period bumps" on her face but says that they self resolve after the end of her period. She states the bump she is complaining about has not resolved but grown in size and reports associated swelling to the area. She reports that her right eye started swelling about 5 days ago and reports associated clear drainage. Pt states that she has been putting warm compresses on the area and has been taking ibuprofen and has noticed some minimal improvement in swelling. Pt reports no pertinent past medical history. She reports NKDA.   History reviewed. No pertinent past medical history. Past Surgical History  Procedure Laterality Date  . Cesarean section    . Tubal ligation     History reviewed. No pertinent family history. History  Substance Use Topics  . Smoking status: Current Every Day Smoker -- 1.00 packs/day for 15 years    Types: Cigarettes  . Smokeless tobacco: Never Used  . Alcohol Use: No   OB History    No data available     Review of Systems  Constitutional: Negative for fever.  HENT: Positive for facial swelling.   Eyes: Positive for discharge.  Gastrointestinal: Negative for nausea and vomiting.  Skin: Negative for color change.       Positive for abscess.  Hematological: Negative for adenopathy.    Allergies  Review of patient's allergies indicates no known allergies.  Home  Medications   Prior to Admission medications   Medication Sig Start Date End Date Taking? Authorizing Provider  ibuprofen (ADVIL,MOTRIN) 200 MG tablet Take 400 mg by mouth every 6 (six) hours as needed for fever or moderate pain.    Historical Provider, MD   BP 141/93 mmHg  Pulse 79  Temp(Src) 98.3 F (36.8 C) (Oral)  Resp 18  Ht 5' (1.524 m)  Wt 110 lb (49.896 kg)  BMI 21.48 kg/m2  SpO2 98%  LMP 09/28/2014   Physical Exam  Constitutional: She appears well-developed and well-nourished.  HENT:  Head: Normocephalic and atraumatic.  Eyes: Conjunctivae are normal. No scleral icterus.  Neck: Normal range of motion. Neck supple. No tracheal deviation present.  Cardiovascular: Normal rate.   Pulmonary/Chest: Effort normal. No respiratory distress.  Abdominal: She exhibits no distension.  Neurological: She is alert.  Skin: Skin is warm and dry.  1 cm tender nodule to right aspect of bridge of nose. Mildly fluctuant. Mild overlying erythema with no surrounding erythema. Very mild periorbital edema inferiorly without signs of periorbital cellulitis.  Psychiatric: She has a normal mood and affect. Her behavior is normal.  Nursing note and vitals reviewed.   ED Course  Procedures   DIAGNOSTIC STUDIES: Oxygen Saturation is 98% on RA, normal by my interpretation.    COORDINATION OF CARE: 6:59 PM Discussed treatment plan with pt at bedside and pt agreed to plan.   Labs Review Labs  Reviewed - No data to display  Imaging Review No results found.   EKG Interpretation None      Vital signs reviewed and are as follows: Filed Vitals:   10/08/14 1909  BP: 147/93  Pulse: 65  Temp: 98.3 F (36.8 C)  Resp: 16   Will have patient continue warm compresses and start on Bactrim. We discussed utility of I&D at this point and agree that we should wait given location of area.   Patient should return to the emergency department in 48 hours if not improved. We discussed return sooner  with worsening redness especially if it spreads around the eye.  MDM   Final diagnoses:  Skin infection   Patient with small, probable infected sebaceous cyst, right bridge of nose. It is mildly fluctuant. Given location and would not I&D this area at this point. Would not attempt needle aspiration unless area continues to enlarge. Her infection is currently very mild. Will treat with oral antibiotics, warm compresses, and monitor.   I personally performed the services described in this documentation, which was scribed in my presence. The recorded information has been reviewed and is accurate.      Renne CriglerJoshua Lathan Gieselman, PA-C 10/08/14 1913  Mancel BaleElliott Wentz, MD 10/09/14 [phone]

## 2014-10-08 NOTE — ED Notes (Signed)
Pt reports bump on right side of nose appearing about a week and a half ago.  Pt sts that she started putting warm compresses on it and it has gotten bigger.  Swelling to right side of nose and under right eye.  Pt sts she has a hx of seasonal allergies.  Has been taking ibuprofen with some improvement in swelling.

## 2014-10-08 NOTE — Discharge Instructions (Signed)
Please read and follow all provided instructions.  Your diagnoses today include:  1. Skin infection     Tests performed today include:  Vital signs. See below for your results today.   Medications prescribed:   Bactrim (trimethoprim/sulfamethoxazole) - antibiotic  You have been prescribed an antibiotic medicine: take the entire course of medicine even if you are feeling better. Stopping early can cause the antibiotic not to work.  Take any prescribed medications only as directed.   Home care instructions:   Follow any educational materials contained in this packet  Follow-up instructions: Return to the Emergency Department in 48 hours for a recheck if your symptoms are not significantly improved.  Please follow-up with your primary care provider in the next 1 week for further evaluation of your symptoms.   Return instructions:  Return to the Emergency Department if you have:  Fever  Worsening symptoms  Worsening pain  Worsening swelling  Redness of the skin that moves away from the affected area, especially if it streaks away from the affected area   Any other emergent concerns  Your vital signs today were: BP 141/93 mmHg   Pulse 79   Temp(Src) 98.3 F (36.8 C) (Oral)   Resp 18   Ht 5' (1.524 m)   Wt 110 lb (49.896 kg)   BMI 21.48 kg/m2   SpO2 98%   LMP 09/28/2014 If your blood pressure (BP) was elevated above 135/85 this visit, please have this repeated by your doctor within one month. --------------

## 2015-05-29 ENCOUNTER — Emergency Department (HOSPITAL_COMMUNITY)
Admission: EM | Admit: 2015-05-29 | Discharge: 2015-05-29 | Disposition: A | Discharge status: Home / Self Care | Payer: No Typology Code available for payment source | Attending: Emergency Medicine | Admitting: Emergency Medicine

## 2015-05-29 ENCOUNTER — Encounter (HOSPITAL_COMMUNITY): Payer: Self-pay | Admitting: Emergency Medicine

## 2015-05-29 DIAGNOSIS — Z720 Tobacco use: Secondary | ICD-10-CM | POA: Insufficient documentation

## 2015-05-29 DIAGNOSIS — Y998 Other external cause status: Secondary | ICD-10-CM | POA: Diagnosis not present

## 2015-05-29 DIAGNOSIS — Y92410 Unspecified street and highway as the place of occurrence of the external cause: Secondary | ICD-10-CM | POA: Insufficient documentation

## 2015-05-29 DIAGNOSIS — S161XXA Strain of muscle, fascia and tendon at neck level, initial encounter: Secondary | ICD-10-CM | POA: Insufficient documentation

## 2015-05-29 DIAGNOSIS — Y9389 Activity, other specified: Secondary | ICD-10-CM | POA: Insufficient documentation

## 2015-05-29 DIAGNOSIS — Z792 Long term (current) use of antibiotics: Secondary | ICD-10-CM | POA: Diagnosis not present

## 2015-05-29 DIAGNOSIS — S199XXA Unspecified injury of neck, initial encounter: Secondary | ICD-10-CM | POA: Diagnosis present

## 2015-05-29 MED ORDER — CYCLOBENZAPRINE HCL 10 MG PO TABS: 10.0000 mg | ORAL_TABLET | Freq: Two times a day (BID) | ORAL | Status: DC | PRN

## 2015-05-29 MED ORDER — HYDROCODONE-ACETAMINOPHEN 5-325 MG PO TABS: 1.0000 | ORAL_TABLET | Freq: Four times a day (QID) | ORAL | Status: DC | PRN
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
## 2015-07-31 ENCOUNTER — Emergency Department (HOSPITAL_COMMUNITY)
Admission: EM | Admit: 2015-07-31 | Discharge: 2015-07-31 | Disposition: A | Discharge status: Home / Self Care | Payer: Medicaid Other | Attending: Physician Assistant | Admitting: Physician Assistant

## 2015-07-31 ENCOUNTER — Encounter (HOSPITAL_COMMUNITY): Payer: Self-pay | Admitting: Emergency Medicine

## 2015-07-31 DIAGNOSIS — F17210 Nicotine dependence, cigarettes, uncomplicated: Secondary | ICD-10-CM | POA: Insufficient documentation

## 2015-07-31 DIAGNOSIS — L0291 Cutaneous abscess, unspecified: Secondary | ICD-10-CM

## 2015-07-31 DIAGNOSIS — Z792 Long term (current) use of antibiotics: Secondary | ICD-10-CM | POA: Insufficient documentation

## 2015-07-31 DIAGNOSIS — L02411 Cutaneous abscess of right axilla: Secondary | ICD-10-CM | POA: Insufficient documentation

## 2015-07-31 MED ORDER — LIDOCAINE-EPINEPHRINE (PF) 2 %-1:200000 IJ SOLN
10.0000 mL | Freq: Once | INTRAMUSCULAR | Status: AC
  Administered 2015-07-31: 10 mL
  Filled 2015-07-31: qty 10

## 2015-07-31 MED ORDER — HYDROCODONE-ACETAMINOPHEN 5-325 MG PO TABS: 1.0000 | ORAL_TABLET | Freq: Four times a day (QID) | ORAL | Status: DC | PRN

## 2015-07-31 MED ORDER — CEPHALEXIN 500 MG PO CAPS: 500.0000 mg | ORAL_CAPSULE | Freq: Four times a day (QID) | ORAL | Status: DC

## 2015-07-31 NOTE — ED Provider Notes (Signed)
CSN: [id number]     Arrival date & time 07/31/15  1309 History  By signing my name below, I, [name], attest that this documentation has been prepared under the direction and in the presence of Suzanne Horseman, PA-C Electronically Signed: Charline Padilla, ED Scribe 07/31/2015 at 2:03 PM.  Chief Complaint  Patient presents with  . Abscess   The history is provided by the patient. No language interpreter was used.   HPI Comments: [name] is a 40 y.o. female who presents to the Emergency Department with a chief complaint of a gradually worsening, painful right axillary abscess for over a week. Pt reports constant, 8/10 pain to the area that is exacerbated with palpation. She has tried warm compresses, peroxide and neosporin without significant relief. Pt reports h/o similar abscess that required lancing and packing.   No past medical history on file. Past Surgical History  Procedure Laterality Date  . Cesarean section    . Tubal ligation     No family history on file. Social History  Substance Use Topics  . Smoking status: Current Every Day Smoker -- 1.00 packs/day for 15 years    Types: Cigarettes  . Smokeless tobacco: Never Used  . Alcohol Use: No   OB History    No data available     Review of Systems  Constitutional: Negative for fever and chills.  Respiratory: Negative for shortness of breath.   Cardiovascular: Negative for chest pain.  Gastrointestinal: Negative for nausea, vomiting, diarrhea and constipation.  Genitourinary: Negative for dysuria.  Skin:       + Abscess   Allergies  Review of patient's allergies indicates no known allergies.  Home Medications   Prior to Admission medications   Medication Sig Start Date End Date Taking? Authorizing Provider  cyclobenzaprine (FLEXERIL) 10 MG tablet Take 1 tablet (10 mg total) by mouth 2 (two) times daily as needed for muscle spasms. 05/29/15   Suzanne Horseman, PA-C  HYDROcodone-acetaminophen (NORCO/VICODIN)  5-325 MG tablet Take 1-2 tablets by mouth every 6 (six) hours as needed. 05/29/15   Suzanne Horseman, PA-C  ibuprofen (ADVIL,MOTRIN) 200 MG tablet Take 400 mg by mouth every 6 (six) hours as needed for fever or moderate pain.    Historical Provider, MD  sulfamethoxazole-trimethoprim (SEPTRA DS) 800-160 MG per tablet Take 1 tablet by mouth 2 (two) times daily. 10/08/14   Suzanne Crigler, PA-C   BP 153/97 mmHg  Pulse 79  Temp(Src) 98.5 F (36.9 C) (Oral)  Resp 18  SpO2 100% Physical Exam  Constitutional: She is oriented to person, place, and time. She appears well-developed and well-nourished. No distress.  HENT:  Head: Normocephalic and atraumatic.  Eyes: Conjunctivae and EOM are normal.  Neck: Neck supple. No tracheal deviation present.  Cardiovascular: Normal rate.   Pulmonary/Chest: Effort normal. No respiratory distress.  Musculoskeletal: Normal range of motion.  Neurological: She is alert and oriented to person, place, and time.  Skin: Skin is warm and dry.  1x1 cm R axillary abscess. No surrounding cellulitis.   Psychiatric: She has a normal mood and affect. Her behavior is normal.  Nursing note and vitals reviewed.  ED Course  Procedures (including critical care time) DIAGNOSTIC STUDIES: Oxygen Saturation is 100% on RA, normal by my interpretation.    COORDINATION OF CARE: 1:42 PM-Discussed treatment plan which includes I&D, Keflex and Norco with pt at bedside and pt agreed to plan.   INCISION AND DRAINAGE Performed by: [name] Consent: Verbal consent obtained. Risks and  benefits: risks, benefits and alternatives were discussed Type: abscess  Body area: Right axilla  Anesthesia: local infiltration  Incision was made with a scalpel.  Local anesthetic: lidocaine 2% with epinephrine  Anesthetic total: 1 ml  Complexity: complex Blunt dissection to break up loculations  Drainage: purulent  Drainage amount: moderate  Packing material: none  Patient  tolerance: Patient tolerated the procedure well with no immediate complications.     MDM   Final diagnoses:  Abscess    Patient with skin abscess amenable to incision and drainage.  Abscess was not large enough to warrant packing or drain,  wound recheck in 2 days. Encouraged home warm soaks and flushing.  Mild signs of cellulitis is surrounding skin.  Will d/c to home.    I personally performed the services described in this documentation, which was scribed in my presence. The recorded information has been reviewed and is accurate.     Suzanne Horsemanobert Necole Minassian, PA-C 07/31/15 1406  Suzanne Lyn Corlis LeakMackuen, MD 08/01/15 [phone]

## 2015-07-31 NOTE — Discharge Instructions (Signed)

## 2015-07-31 NOTE — ED Notes (Signed)
Right axillary abscess

## 2015-08-18 ENCOUNTER — Emergency Department (HOSPITAL_COMMUNITY)
Admission: EM | Admit: 2015-08-18 | Discharge: 2015-08-18 | Disposition: A | Discharge status: Home / Self Care | Payer: No Typology Code available for payment source | Attending: Emergency Medicine | Admitting: Emergency Medicine

## 2015-08-18 ENCOUNTER — Encounter (HOSPITAL_COMMUNITY): Payer: Self-pay | Admitting: Emergency Medicine

## 2015-08-18 ENCOUNTER — Emergency Department (HOSPITAL_COMMUNITY): Payer: No Typology Code available for payment source

## 2015-08-18 DIAGNOSIS — Z792 Long term (current) use of antibiotics: Secondary | ICD-10-CM | POA: Diagnosis not present

## 2015-08-18 DIAGNOSIS — Y9389 Activity, other specified: Secondary | ICD-10-CM | POA: Diagnosis not present

## 2015-08-18 DIAGNOSIS — S4992XA Unspecified injury of left shoulder and upper arm, initial encounter: Secondary | ICD-10-CM | POA: Diagnosis not present

## 2015-08-18 DIAGNOSIS — S199XXA Unspecified injury of neck, initial encounter: Secondary | ICD-10-CM | POA: Insufficient documentation

## 2015-08-18 DIAGNOSIS — Y92410 Unspecified street and highway as the place of occurrence of the external cause: Secondary | ICD-10-CM | POA: Insufficient documentation

## 2015-08-18 DIAGNOSIS — M542 Cervicalgia: Secondary | ICD-10-CM

## 2015-08-18 DIAGNOSIS — F17210 Nicotine dependence, cigarettes, uncomplicated: Secondary | ICD-10-CM | POA: Insufficient documentation

## 2015-08-18 DIAGNOSIS — Y998 Other external cause status: Secondary | ICD-10-CM | POA: Insufficient documentation

## 2015-08-18 DIAGNOSIS — M25512 Pain in left shoulder: Secondary | ICD-10-CM

## 2015-08-18 MED ORDER — IBUPROFEN 400 MG PO TABS
800.0000 mg | ORAL_TABLET | Freq: Once | ORAL | Status: AC
  Administered 2015-08-18: 800 mg via ORAL
  Filled 2015-08-18: qty 2

## 2015-08-18 MED ORDER — IBUPROFEN 800 MG PO TABS: 800.0000 mg | ORAL_TABLET | Freq: Three times a day (TID) | ORAL | Status: DC

## 2015-08-18 MED ORDER — ACETAMINOPHEN 325 MG PO TABS
650.0000 mg | ORAL_TABLET | Freq: Once | ORAL | Status: AC
  Administered 2015-08-18: 650 mg via ORAL
  Filled 2015-08-18: qty 2

## 2015-08-18 MED ORDER — CYCLOBENZAPRINE HCL 5 MG PO TABS: 5.0000 mg | ORAL_TABLET | Freq: Three times a day (TID) | ORAL | Status: DC | PRN

## 2015-08-18 NOTE — Discharge Instructions (Signed)
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
## 2015-08-20 ENCOUNTER — Encounter (HOSPITAL_COMMUNITY): Payer: Self-pay | Admitting: Emergency Medicine

## 2015-08-20 ENCOUNTER — Emergency Department (HOSPITAL_COMMUNITY)
Admission: EM | Admit: 2015-08-20 | Discharge: 2015-08-20 | Disposition: A | Discharge status: Home / Self Care | Payer: No Typology Code available for payment source | Attending: Emergency Medicine | Admitting: Emergency Medicine

## 2015-08-20 DIAGNOSIS — M62838 Other muscle spasm: Secondary | ICD-10-CM | POA: Diagnosis not present

## 2015-08-20 DIAGNOSIS — F17210 Nicotine dependence, cigarettes, uncomplicated: Secondary | ICD-10-CM | POA: Diagnosis not present

## 2015-08-20 DIAGNOSIS — S4992XD Unspecified injury of left shoulder and upper arm, subsequent encounter: Secondary | ICD-10-CM | POA: Diagnosis not present

## 2015-08-20 DIAGNOSIS — Z792 Long term (current) use of antibiotics: Secondary | ICD-10-CM | POA: Insufficient documentation

## 2015-08-20 DIAGNOSIS — Z791 Long term (current) use of non-steroidal anti-inflammatories (NSAID): Secondary | ICD-10-CM | POA: Insufficient documentation

## 2015-08-20 DIAGNOSIS — S199XXD Unspecified injury of neck, subsequent encounter: Secondary | ICD-10-CM | POA: Insufficient documentation

## 2015-08-20 MED ORDER — METHOCARBAMOL 500 MG PO TABS
500.0000 mg | ORAL_TABLET | Freq: Once | ORAL | Status: AC
  Administered 2015-08-20: 500 mg via ORAL
  Filled 2015-08-20: qty 1
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
## 2015-08-20 NOTE — Discharge Instructions (Signed)
Fill the prescriptions given to you at last visit. continue motrin as well. Return here for emergent concerns.

## 2015-11-05 ENCOUNTER — Encounter (HOSPITAL_COMMUNITY): Payer: Self-pay

## 2015-11-05 ENCOUNTER — Emergency Department (HOSPITAL_COMMUNITY)
Admission: EM | Admit: 2015-11-05 | Discharge: 2015-11-05 | Disposition: A | Discharge status: Home / Self Care | Payer: No Typology Code available for payment source | Attending: Emergency Medicine | Admitting: Emergency Medicine

## 2015-11-05 DIAGNOSIS — M62838 Other muscle spasm: Secondary | ICD-10-CM | POA: Insufficient documentation

## 2015-11-05 DIAGNOSIS — F17210 Nicotine dependence, cigarettes, uncomplicated: Secondary | ICD-10-CM | POA: Insufficient documentation

## 2015-11-05 DIAGNOSIS — Z87828 Personal history of other (healed) physical injury and trauma: Secondary | ICD-10-CM | POA: Insufficient documentation
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
## 2015-11-05 NOTE — ED Notes (Addendum)
Pt  In hall wanting to know how long before she was seen. Pt's visitor walked out and stated" you two need to get off your fat ass and get some work done[employer]." Security notified to speak Pt and visitor.

## 2015-11-05 NOTE — ED Notes (Addendum)
Patient in hall asking how long she will have to wait.  Advised patient there are a couple of patients ahead of her. Patient appears angry, family members exits room cursing at this tech and the nurse stating "you need to do some work".  Patient left to go to car.  Security called.

## 2015-11-05 NOTE — ED Notes (Signed)
Patient here with ongoing left sided neck spasm [facility]

## 2015-11-05 NOTE — ED Notes (Signed)
Pt reported she was not going to wait longer. All she needed was a muscle relaxer.

## 2015-11-17 ENCOUNTER — Encounter (HOSPITAL_COMMUNITY): Payer: Self-pay

## 2015-11-17 ENCOUNTER — Emergency Department (HOSPITAL_COMMUNITY)
Admission: EM | Admit: 2015-11-17 | Discharge: 2015-11-17 | Disposition: A | Discharge status: Home / Self Care | Payer: No Typology Code available for payment source | Attending: Emergency Medicine | Admitting: Emergency Medicine

## 2015-11-17 DIAGNOSIS — F17210 Nicotine dependence, cigarettes, uncomplicated: Secondary | ICD-10-CM | POA: Insufficient documentation

## 2015-11-17 DIAGNOSIS — J029 Acute pharyngitis, unspecified: Secondary | ICD-10-CM | POA: Insufficient documentation

## 2015-11-17 DIAGNOSIS — M62838 Other muscle spasm: Secondary | ICD-10-CM | POA: Insufficient documentation

## 2015-11-17 DIAGNOSIS — H6692 Otitis media, unspecified, left ear: Secondary | ICD-10-CM | POA: Insufficient documentation

## 2015-11-17 LAB — RAPID STREP SCREEN (MED CTR MEBANE ONLY): STREPTOCOCCUS, GROUP A SCREEN (DIRECT): NEGATIVE

## 2015-11-17 MED ORDER — AMOXICILLIN 500 MG PO CAPS: 500.0000 mg | ORAL_CAPSULE | Freq: Three times a day (TID) | ORAL | Status: DC

## 2015-11-17 MED ORDER — DEXAMETHASONE SODIUM PHOSPHATE 10 MG/ML IJ SOLN
10.0000 mg | Freq: Once | INTRAMUSCULAR | Status: AC
  Administered 2015-11-17: 10 mg via INTRAMUSCULAR
  Filled 2015-11-17: qty 1

## 2015-11-17 NOTE — ED Notes (Signed)
Patient was alert, oriented and stable upon discharge. RN went over AVS and patient had no further questions.  
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
## 2015-11-17 NOTE — Discharge Instructions (Signed)
Pharyngitis  Pharyngitis is redness, pain, and swelling (inflammation) of your pharynx.   CAUSES   Pharyngitis is usually caused by infection. Most of the time, these infections are from viruses (viral) and are part of a cold. However, sometimes pharyngitis is caused by bacteria (bacterial). Pharyngitis can also be caused by allergies. Viral pharyngitis may be spread from person to person by coughing, sneezing, and personal items or utensils (cups, forks, spoons, toothbrushes). Bacterial pharyngitis may be spread from person to person by more intimate contact, such as kissing.   SIGNS AND SYMPTOMS   Symptoms of pharyngitis include:    Sore throat.    Tiredness (fatigue).    Low-grade fever.    Headache.   Joint pain and muscle aches.   Skin rashes.   Swollen lymph nodes.   Plaque-like film on throat or tonsils (often seen with bacterial pharyngitis).  DIAGNOSIS   Your health care provider will ask you questions about your illness and your symptoms. Your medical history, along with a physical exam, is often all that is needed to diagnose pharyngitis. Sometimes, a rapid strep test is done. Other lab tests may also be done, depending on the suspected cause.   TREATMENT   Viral pharyngitis will usually get better in 3-4 days without the use of medicine. Bacterial pharyngitis is treated with medicines that kill germs (antibiotics).   HOME CARE INSTRUCTIONS    Drink enough water and fluids to keep your urine clear or pale yellow.    Only take over-the-counter or prescription medicines as directed by your health care provider:     If you are prescribed antibiotics, make sure you finish them even if you start to feel better.     Do not take aspirin.    Get lots of rest.    Gargle with 8 oz of salt water ( tsp of salt per 1 qt of water) as often as every 1-2 hours to soothe your throat.    Throat lozenges (if you are not at risk for choking) or sprays may be used to soothe your throat.  SEEK MEDICAL  CARE IF:    You have large, tender lumps in your neck.   You have a rash.   You cough up green, yellow-brown, or bloody spit.  SEEK IMMEDIATE MEDICAL CARE IF:    Your neck becomes stiff.   You drool or are unable to swallow liquids.   You vomit or are unable to keep medicines or liquids down.   You have severe pain that does not go away with the use of recommended medicines.   You have trouble breathing (not caused by a stuffy nose).  MAKE SURE YOU:    Understand these instructions.   Will watch your condition.   Will get help right away if you are not doing well or get worse.     This information is not intended to replace advice given to you by your health care provider. Make sure you discuss any questions you have with your health care provider.     Document Released: 07/11/2005 Document Revised: 05/01/2013 Document Reviewed: 03/18/2013  Elsevier Interactive Patient Education 2016 Elsevier Inc.  Otitis Media, Adult  Otitis media is redness, soreness, and inflammation of the middle ear. Otitis media may be caused by allergies or, most commonly, by infection. Often it occurs as a complication of the common cold.  SIGNS AND SYMPTOMS  Symptoms of otitis media may include:   Earache.   Fever.   Ringing in   your ear.   Headache.   Leakage of fluid from the ear.  DIAGNOSIS  To diagnose otitis media, your health care provider will examine your ear with an otoscope. This is an instrument that allows your health care provider to see into your ear in order to examine your eardrum. Your health care provider also will ask you questions about your symptoms.  TREATMENT   Typically, otitis media resolves on its own within 3-5 days. Your health care provider may prescribe medicine to ease your symptoms of pain. If otitis media does not resolve within 5 days or is recurrent, your health care provider may prescribe antibiotic medicines if he or she suspects that a bacterial infection is the cause.  HOME CARE  INSTRUCTIONS    If you were prescribed an antibiotic medicine, finish it all even if you start to feel better.   Take medicines only as directed by your health care provider.   Keep all follow-up visits as directed by your health care provider.  SEEK MEDICAL CARE IF:   You have otitis media only in one ear, or bleeding from your nose, or both.   You notice a lump on your neck.   You are not getting better in 3-5 days.   You feel worse instead of better.  SEEK IMMEDIATE MEDICAL CARE IF:    You have pain that is not controlled with medicine.   You have swelling, redness, or pain around your ear or stiffness in your neck.   You notice that part of your face is paralyzed.   You notice that the bone behind your ear (mastoid) is tender when you touch it.  MAKE SURE YOU:    Understand these instructions.   Will watch your condition.   Will get help right away if you are not doing well or get worse.     This information is not intended to replace advice given to you by your health care provider. Make sure you discuss any questions you have with your health care provider.     Document Released: 04/15/2004 Document Revised: 08/01/2014 Document Reviewed: 02/05/2013  Elsevier Interactive Patient Education 2016 Elsevier Inc.

## 2015-11-17 NOTE — ED Notes (Signed)
Pt here with sore throat and earache x 3 days.  Pt is able to swallow.  Pt hoarse.

## 2015-11-20 LAB — CULTURE, GROUP A STREP (THRC)

## 2015-12-11 ENCOUNTER — Encounter (HOSPITAL_COMMUNITY): Payer: Self-pay | Admitting: Emergency Medicine

## 2015-12-11 ENCOUNTER — Emergency Department (HOSPITAL_COMMUNITY)
Admission: EM | Admit: 2015-12-11 | Discharge: 2015-12-11 | Disposition: A | Discharge status: Home / Self Care | Payer: No Typology Code available for payment source | Attending: Emergency Medicine | Admitting: Emergency Medicine

## 2015-12-11 DIAGNOSIS — Z791 Long term (current) use of non-steroidal anti-inflammatories (NSAID): Secondary | ICD-10-CM | POA: Insufficient documentation

## 2015-12-11 DIAGNOSIS — M542 Cervicalgia: Secondary | ICD-10-CM | POA: Insufficient documentation

## 2015-12-11 DIAGNOSIS — F17210 Nicotine dependence, cigarettes, uncomplicated: Secondary | ICD-10-CM | POA: Insufficient documentation

## 2015-12-11 DIAGNOSIS — G8929 Other chronic pain: Secondary | ICD-10-CM | POA: Insufficient documentation

## 2015-12-11 DIAGNOSIS — Z792 Long term (current) use of antibiotics: Secondary | ICD-10-CM | POA: Insufficient documentation

## 2015-12-11 DIAGNOSIS — M62838 Other muscle spasm: Secondary | ICD-10-CM

## 2015-12-11 MED ORDER — METHOCARBAMOL 500 MG PO TABS
1000.0000 mg | ORAL_TABLET | Freq: Once | ORAL | Status: AC
  Administered 2015-12-11: 1000 mg via ORAL
  Filled 2015-12-11: qty 2

## 2015-12-11 MED ORDER — MELOXICAM 15 MG PO TABS: 15.0000 mg | ORAL_TABLET | Freq: Every day | ORAL | Status: DC

## 2015-12-11 MED ORDER — METHOCARBAMOL 500 MG PO TABS: 500.0000 mg | ORAL_TABLET | Freq: Three times a day (TID) | ORAL | Status: DC | PRN

## 2015-12-11 MED ORDER — MELOXICAM 15 MG PO TABS
15.0000 mg | ORAL_TABLET | Freq: Once | ORAL | Status: AC
  Administered 2015-12-11: 15 mg via ORAL
  Filled 2015-12-11: qty 1

## 2015-12-11 NOTE — Discharge Instructions (Signed)
Take mobic as directed for inflammation and pain with tylenol for breakthrough pain and robaxin for muscle relaxation. Do not drive or operate machinery with muscle relaxant use. Use heat to areas of soreness, no more than 20 minutes at a time every hour. Gently massage the areas to help with pain/spasms. Follow up with primary care physician for recheck of ongoing symptoms in the next 1-2 weeks. Return to ER for emergent changing or worsening of symptoms.     Musculoskeletal Pain Musculoskeletal pain is muscle and boney aches and pains. These pains can occur in any part of the body. Your caregiver may treat you without knowing the cause of the pain. They may treat you if blood or urine tests, X-rays, and other tests were normal.  CAUSES There is often not a definite cause or reason for these pains. These pains may be caused by a type of germ (virus). The discomfort may also come from overuse. Overuse includes working out too hard when your body is not fit. Boney aches also come from weather changes. Bone is sensitive to atmospheric pressure changes. HOME CARE INSTRUCTIONS   Ask when your test results will be ready. Make sure you get your test results.  Only take over-the-counter or prescription medicines for pain, discomfort, or fever as directed by your caregiver. If you were given medications for your condition, do not drive, operate machinery or power tools, or sign legal documents for 24 hours. Do not drink alcohol. Do not take sleeping pills or other medications that may interfere with treatment.  Continue all activities unless the activities cause more pain. When the pain lessens, slowly resume normal activities. Gradually increase the intensity and duration of the activities or exercise.  During periods of severe pain, bed rest may be helpful. Lay or sit in any position that is comfortable.  Putting ice on the injured area.  Put ice in a bag.  Place a towel between your skin and the  bag.  Leave the ice on for 15 to 20 minutes, 3 to 4 times a day.  Follow up with your caregiver for continued problems and no reason can be found for the pain. If the pain becomes worse or does not go away, it may be necessary to repeat tests or do additional testing. Your caregiver may need to look further for a possible cause. SEEK IMMEDIATE MEDICAL CARE IF:  You have pain that is getting worse and is not relieved by medications.  You develop chest pain that is associated with shortness or breath, sweating, feeling sick to your stomach (nauseous), or throw up (vomit).  Your pain becomes localized to the abdomen.  You develop any new symptoms that seem different or that concern you. MAKE SURE YOU:   Understand these instructions.  Will watch your condition.  Will get help right away if you are not doing well or get worse.   This information is not intended to replace advice given to you by your health care provider. Make sure you discuss any questions you have with your health care provider.   Document Released: 07/11/2005 Document Revised: 10/03/2011 Document Reviewed: 03/15/2013 Elsevier Interactive Patient Education 2016 Elsevier Inc.  Foot LockerHeat Therapy Heat therapy can help ease sore, stiff, injured, and tight muscles and joints. Heat relaxes your muscles, which may help ease your pain. Heat therapy should only be used on old, pre-existing, or long-lasting (chronic) injuries. Do not use heat therapy unless told by your doctor. HOW TO USE HEAT THERAPY There are  several different kinds of heat therapy, including:  Moist heat pack.  Warm water bath.  Hot water bottle.  Electric heating pad.  Heated gel pack.  Heated wrap.  Electric heating pad. GENERAL HEAT THERAPY RECOMMENDATIONS   Do not sleep while using heat therapy. Only use heat therapy while you are awake.  Your skin may turn pink while using heat therapy. Do not use heat therapy if your skin turns red.  Do not  use heat therapy if you have new pain.  High heat or long exposure to heat can cause burns. Be careful when using heat therapy to avoid burning your skin.  Do not use heat therapy on areas of your skin that are already irritated, such as with a rash or sunburn. GET HELP IF:   You have blisters, redness, swelling (puffiness), or numbness.  You have new pain.  Your pain is worse. MAKE SURE YOU:  Understand these instructions.  Will watch your condition.  Will get help right away if you are not doing well or get worse.   This information is not intended to replace advice given to you by your health care provider. Make sure you discuss any questions you have with your health care provider.   Document Released: 10/03/2011 Document Revised: 08/01/2014 Document Reviewed: 09/03/2013 Elsevier Interactive Patient Education [employer]
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
## 2015-12-23 ENCOUNTER — Encounter (HOSPITAL_COMMUNITY): Payer: Self-pay | Admitting: *Deleted

## 2015-12-23 ENCOUNTER — Emergency Department (HOSPITAL_COMMUNITY)
Admission: EM | Admit: 2015-12-23 | Discharge: 2015-12-23 | Disposition: A | Discharge status: Home / Self Care | Payer: No Typology Code available for payment source | Attending: Emergency Medicine | Admitting: Emergency Medicine

## 2015-12-23 DIAGNOSIS — L02411 Cutaneous abscess of right axilla: Secondary | ICD-10-CM | POA: Insufficient documentation

## 2015-12-23 DIAGNOSIS — F17210 Nicotine dependence, cigarettes, uncomplicated: Secondary | ICD-10-CM | POA: Insufficient documentation

## 2015-12-23 MED ORDER — SULFAMETHOXAZOLE-TRIMETHOPRIM 800-160 MG PO TABS: 1.0000 | ORAL_TABLET | Freq: Two times a day (BID) | ORAL | Status: AC

## 2015-12-23 MED ORDER — HYDROCODONE-ACETAMINOPHEN 5-325 MG PO TABS
1.0000 | ORAL_TABLET | Freq: Once | ORAL | Status: AC
  Administered 2015-12-23: 1 via ORAL
  Filled 2015-12-23: qty 1

## 2015-12-23 MED ORDER — HYDROCODONE-ACETAMINOPHEN 5-325 MG PO TABS: 1.0000 | ORAL_TABLET | ORAL | Status: DC | PRN

## 2015-12-23 MED ORDER — LIDOCAINE-EPINEPHRINE 1 %-1:100000 IJ SOLN
20.0000 mL | Freq: Once | INTRAMUSCULAR | Status: AC
  Administered 2015-12-23: 20 mL
  Filled 2015-12-23: qty 1

## 2015-12-23 NOTE — ED Notes (Signed)
Pt presents with R axilla abscess x 1 week with purulent drainage.

## 2015-12-23 NOTE — Discharge Instructions (Signed)
Incision and Drainage Incision and drainage is a procedure in which a sac-like structure (cystic structure) is opened and drained. The area to be drained usually contains material such as pus, fluid, or blood.  LET YOUR CAREGIVER KNOW ABOUT:   Allergies to medicine.  Medicines taken, including vitamins, herbs, eyedrops, over-the-counter medicines, and creams.  Use of steroids (by mouth or creams).  Previous problems with anesthetics or numbing medicines.  History of bleeding problems or blood clots.  Previous surgery.  Other health problems, including diabetes and kidney problems.  Possibility of pregnancy, if this applies. RISKS AND COMPLICATIONS  Pain.  Bleeding.  Scarring.  Infection. BEFORE THE PROCEDURE  You may need to have an ultrasound or other imaging tests to see how large or deep your cystic structure is. Blood tests may also be used to determine if you have an infection or how severe the infection is. You may need to have a tetanus shot. PROCEDURE  The affected area is cleaned with a cleaning fluid. The cyst area will then be numbed with a medicine (local anesthetic). A small incision will be made in the cystic structure. A syringe or catheter may be used to drain the contents of the cystic structure, or the contents may be squeezed out. The area will then be flushed with a cleansing solution. After cleansing the area, it is often gently packed with a gauze or another wound dressing. Once it is packed, it will be covered with gauze and tape or some other type of wound dressing. AFTER THE PROCEDURE   Often, you will be allowed to go home right after the procedure.  You may be given antibiotic medicine to prevent or heal an infection.  If the area was packed with gauze or some other wound dressing, you will likely need to come back in 1 to 2 days to get it removed.  The area should heal in about 14 days.   This information is not intended to replace advice given  to you by your health care provider. Make sure you discuss any questions you have with your health care provider.   Document Released: 01/04/2001 Document Revised: 01/10/2012 Document Reviewed: 09/05/2011 Elsevier Interactive Patient Education 2016 Elsevier Inc.  

## 2015-12-23 NOTE — ED Provider Notes (Signed)
CSN: [id number]     Arrival date & time 12/23/15  1041 History   First MD Initiated Contact with Patient 12/23/15 1114     Chief Complaint  Patient presents with  . Abscess     HPI   [name] is an 40 y.o. female with no significant PMH who presents to the ED for evaluation of right axillary absces. She states she has a history of abscesses in this region and started noticing a firm, tender lump about one week ago. It has since then been increasing in size and yesterday started draining on its own. States it is very painful and tender. She has not noticed any fever or chills. She has not taken anything to alleviate her pain. She states that in the past her abscesses have healed better when packed. Denies significant medical history.   History reviewed. No pertinent past medical history. Past Surgical History  Procedure Laterality Date  . Cesarean section    . Tubal ligation     No family history on file. Social History  Substance Use Topics  . Smoking status: Current Every Day Smoker -- 1.00 packs/day for 15 years    Types: Cigarettes  . Smokeless tobacco: Never Used  . Alcohol Use: No   OB History    No data available     Review of Systems  All other systems reviewed and are negative.     Allergies  Review of patient's allergies indicates no known allergies.  Home Medications   Prior to Admission medications   Medication Sig Start Date End Date Taking? Authorizing Provider  amoxicillin (AMOXIL) 500 MG capsule Take 1 capsule (500 mg total) by mouth 3 (three) times daily. Patient not taking: Reported on 12/11/2015 11/17/15   Marlon Pel, PA-C  cephALEXin (KEFLEX) 500 MG capsule Take 1 capsule (500 mg total) by mouth 4 (four) times daily. Patient not taking: Reported on 08/20/2015 07/31/15   Roxy Horseman, PA-C  cyclobenzaprine (FLEXERIL) 5 MG tablet Take 1 tablet (5 mg total) by mouth 3 (three) times daily as needed for muscle spasms. Patient not taking:  Reported on 11/17/2015 08/18/15   Cheri Fowler, PA-C  ibuprofen (ADVIL,MOTRIN) 800 MG tablet Take 1 tablet (800 mg total) by mouth 3 (three) times daily. Patient not taking: Reported on 11/17/2015 08/18/15   Cheri Fowler, PA-C  meloxicam (MOBIC) 15 MG tablet Take 1 tablet (15 mg total) by mouth daily. TAKE WITH MEALS 12/11/15   Mercedes Camprubi-Soms, PA-C  methocarbamol (ROBAXIN) 500 MG tablet Take 1 tablet (500 mg total) by mouth every 8 (eight) hours as needed for muscle spasms. 12/11/15   Mercedes Camprubi-Soms, PA-C   BP 146/99 mmHg  Pulse 67  Temp(Src) 98.9 F (37.2 C) (Oral)  Resp 16  Ht 5' (1.524 m)  Wt 52.164 kg  BMI 22.46 kg/m2  SpO2 100%  LMP 11/25/2015 (Exact Date) Physical Exam  Constitutional: She is oriented to person, place, and time. No distress.  HENT:  Head: Atraumatic.  Right Ear: External ear normal.  Left Ear: External ear normal.  Nose: Nose normal.  Eyes: Conjunctivae are normal. No scleral icterus.  Neck: Normal range of motion. Neck supple.  Cardiovascular: Normal rate and regular rhythm.   Pulmonary/Chest: Effort normal. No respiratory distress. She exhibits no tenderness.  Abdominal: Soft. She exhibits no distension. There is no tenderness.  Lymphadenopathy:    She has no cervical adenopathy.  Neurological: She is alert and oriented to person, place, and time.  Skin: Skin is  warm and dry. She is not diaphoretic.  Right axilla with fluctuant, tender 2.5cm area with small area with purulence draining slowly. Minimal overlying erythema. No erythema spreading.  Psychiatric: She has a normal mood and affect. Her behavior is normal.  Nursing note and vitals reviewed.   ED Course  Procedures (including critical care time)  INCISION AND DRAINAGE Performed by: Noelle PennerSerena Nashali Ditmer Consent: Verbal consent obtained. Risks and benefits: risks, benefits and alternatives were discussed Type: abscess  Body area: right axilla  Anesthesia: local infiltration  Incision was  made with a scalpel.  Local anesthetic: lidocaine 2% with epinephrine  Anesthetic total: 3ml  Complexity: complex Blunt dissection to break up loculations  Drainage: purulent  Drainage amount: moderate  Packing material: 1/4 in iodoform gauze  Patient tolerance: Patient tolerated the procedure well with no immediate complications.     Labs Review Labs Reviewed - No data to display  Imaging Review No results found. I have personally reviewed and evaluated these images and lab results as part of my medical decision-making.   EKG Interpretation None      MDM   Final diagnoses:  Abscess of axilla, right    Pt tolerated I&D well. Area was packed with iodoform gauze and pt [facility] ED [facility] and packing removal. Will also give rx for norco. Pt nontoxic appearing and stable for discharge. ER return precautions given.    Carlene CoriaSerena Y Brandyn Thien, PA-C 12/23/15 1240  Melene Planan Floyd, DO 12/23/15 1345

## 2015-12-25 ENCOUNTER — Encounter (HOSPITAL_COMMUNITY): Payer: Self-pay | Admitting: Emergency Medicine

## 2015-12-25 ENCOUNTER — Emergency Department (HOSPITAL_COMMUNITY)
Admission: EM | Admit: 2015-12-25 | Discharge: 2015-12-25 | Disposition: A | Discharge status: Home / Self Care | Payer: No Typology Code available for payment source | Attending: Emergency Medicine | Admitting: Emergency Medicine

## 2015-12-25 DIAGNOSIS — Z4800 Encounter for change or removal of nonsurgical wound dressing: Secondary | ICD-10-CM

## 2015-12-25 DIAGNOSIS — Z4801 Encounter for change or removal of surgical wound dressing: Secondary | ICD-10-CM | POA: Insufficient documentation

## 2015-12-25 DIAGNOSIS — F17210 Nicotine dependence, cigarettes, uncomplicated: Secondary | ICD-10-CM | POA: Insufficient documentation

## 2015-12-25 NOTE — ED Provider Notes (Signed)
CSN: [id number]     Arrival date & time 12/25/15  1102 History  By signing my name below, I, Emmanuella Mensah, attest that this documentation has been prepared under the direction and in the presence of Desirie Minteer, PA-C. Electronically Signed: Angelene Giovanni, ED Scribe. 12/25/2015. 12:31 PM.   Chief Complaint  Patient presents with  . packing removed    The history is provided by the patient. No language interpreter was used.   HPI Comments: [name] is a 40 y.o. female who presents to the Emergency Department requesting a wound check and packing removal s/p I&D of a right abscess to her axilla 3 days ago. Pt states that she has been compliant with her antibiotics. She adds that she has been changing her gauze appropriately as well.  She denies any fever, chills, vomiting, increased redness, pain, or any other concerns.    History reviewed. No pertinent past medical history. Past Surgical History  Procedure Laterality Date  . Cesarean section    . Tubal ligation     No family history on file. Social History  Substance Use Topics  . Smoking status: Current Every Day Smoker -- 1.00 packs/day for 15 years    Types: Cigarettes  . Smokeless tobacco: Never Used  . Alcohol Use: No   OB History    No data available     Review of Systems  Constitutional: Negative for fever and chills.  Gastrointestinal: Negative for vomiting.  Musculoskeletal: Negative for myalgias and arthralgias.  Skin: Positive for wound. Negative for color change.  Allergic/Immunologic: Negative for immunocompromised state.  Hematological: Does not bruise/bleed easily.  Psychiatric/Behavioral: Negative for self-injury.      Allergies  Review of patient's allergies indicates no known allergies.  Home Medications   Prior to Admission medications   Medication Sig Start Date End Date Taking? Authorizing Provider  amoxicillin (AMOXIL) 500 MG capsule Take 1 capsule (500 mg total) by mouth 3 (three)  times daily. Patient not taking: Reported on 12/11/2015 11/17/15   Marlon Pel, PA-C  cephALEXin (KEFLEX) 500 MG capsule Take 1 capsule (500 mg total) by mouth 4 (four) times daily. Patient not taking: Reported on 08/20/2015 07/31/15   Roxy Horseman, PA-C  cyclobenzaprine (FLEXERIL) 5 MG tablet Take 1 tablet (5 mg total) by mouth 3 (three) times daily as needed for muscle spasms. Patient not taking: Reported on 11/17/2015 08/18/15   Cheri Fowler, PA-C  HYDROcodone-acetaminophen (NORCO/VICODIN) 5-325 MG tablet Take 1 tablet by mouth every 4 (four) hours as needed for severe pain. 12/23/15   Ace Gins Sam, PA-C  ibuprofen (ADVIL,MOTRIN) 800 MG tablet Take 1 tablet (800 mg total) by mouth 3 (three) times daily. Patient not taking: Reported on 11/17/2015 08/18/15   Cheri Fowler, PA-C  meloxicam (MOBIC) 15 MG tablet Take 1 tablet (15 mg total) by mouth daily. TAKE WITH MEALS 12/11/15   Mercedes Camprubi-Soms, PA-C  methocarbamol (ROBAXIN) 500 MG tablet Take 1 tablet (500 mg total) by mouth every 8 (eight) hours as needed for muscle spasms. 12/11/15   Mercedes Camprubi-Soms, PA-C  sulfamethoxazole-trimethoprim (BACTRIM DS,SEPTRA DS) 800-160 MG tablet Take 1 tablet by mouth 2 (two) times daily. 12/23/15 12/30/15  Ace Gins Sam, PA-C   BP 118/89 mmHg  Pulse 89  Temp(Src) 98.2 F (36.8 C) (Oral)  Resp 16  SpO2 100%  LMP 11/25/2015 (Exact Date) Physical Exam  Constitutional: She appears well-developed and well-nourished. No distress.  HENT:  Head: Normocephalic and atraumatic.  Neck: Neck supple.  Pulmonary/Chest: Effort  normal.  Musculoskeletal:  Abscess to right axilla: packing in place, yellow purulent discharge, purulent in the cavity. After normal saline flush, base of cavity seems clean, no active drainage or bleeding, no surrounding edema, erythema or warmth.   Neurological: She is alert.  Skin: She is not diaphoretic.  Nursing note and vitals reviewed.   ED Course  Procedures (including critical care  time) DIAGNOSTIC STUDIES: Oxygen Saturation is 100% on RA, normal by my interpretation.    COORDINATION OF CARE: 12:24 PM- Pt advised of plan for treatment and pt agrees. Packing removed and abscess cavity flushed with normal saline, dressed with dry gauze. Advised pt to continue compliancy with antibiotics. Explained that pt should return if she develops a fever, if area increases in redness, or if area closes.    MDM   Final diagnoses:  Abscess packing removal    Afebrile, nontoxic patient with recheck and packing removal for abscess in right axilla.  Wound healing appropriately, cavity is open.  Packing removed, cavity flushed, wound dressed.  No worsening, no cellulitis. No systemic symptoms.  Discussed home care.   D/C home with home care instructions, return precautions.   Discussed result, findings, treatment, and follow up  with patient.  Pt given return precautions.  Pt verbalizes understanding and agrees with plan.       I personally performed the services described in this documentation, which was scribed in my presence. The recorded information has been reviewed and is accurate.   Trixie Dredgemily Jalaiyah Throgmorton, PA-C 12/25/15 1330  Gwyneth SproutWhitney Plunkett, MD 12/26/15 [phone]

## 2015-12-25 NOTE — Discharge Instructions (Signed)
Read the information below.  You may return to the Emergency Department at any time for worsening condition or any new symptoms that concern you.  Keep a dry gauze over the abscess to allow for drainage.  Run warm water over it in the shower.  You may also use unscented soap to clean the area.  If you develop redness, swelling, increased pus draining from the wound, worsening pain, or fevers greater than 100.4, return to the ER immediately for a recheck.     Dressing Change A dressing is a material placed over wounds. It keeps the wound clean, dry, and protected from further injury.  BEFORE YOU BEGIN  Get your supplies together. Things you may need include:  Salt solution (saline).  Flexible gauze bandage.  Medicated cream.  Tape.  Gloves.  Belly (abdominal) pads.  Gauze squares.  Plastic bags.  Take pain medicine 30 minutes before the bandage change if you need it.  Take a shower before you do the first bandage change of the day. Put plastic wrap or a bag over the dressing. REMOVING YOUR OLD BANDAGE  Wash your hands with soap and water. Dry your hands with a clean towel.  Put on your gloves.  Remove any tape.  Remove the old bandage as told. If it sticks, put a small amount of warm water on it to loosen the bandage.  Remove any gauze or packing tape in your wound.  Take off your gloves.  Put the gloves, tape, gauze, or any packing tape in a plastic bag. CHANGING YOUR BANDAGE  Open the supplies.  Take the cap off the salt solution.  Open the gauze. Leave the gauze on the inside of the package.  Put on your gloves.  Clean your wound as told by your doctor.  Keep your wound dry if your doctor told you to do so.  Your doctor may tell you to do one or more of the following:  Pick up the gauze. Pour the salt solution over the gauze. Squeeze out the extra salt solution.  Put medicated cream or other medicine on your wound.  Put solution soaked gauze only in  your wound, not on the skin around it.  Pack your wound loosely.  Put dry gauze on your wound.  Put belly pads over the dry gauze if your bandages soak through.  Tape the bandage in place so it will not fall off. Do not wrap the tape all the way around your arm or leg.  Wrap the bandage with the flexible gauze bandage as told by your doctor.  Take off your gloves. Put them in the plastic bag with the old bandage. Tie the bag shut and throw it away.  Keep the bandage clean and dry.  Wash your hands. GET HELP RIGHT AWAY IF:   Your skin around the wound looks red.  Your wound feels more tender or sore.  You see yellowish-white fluid (pus) in the wound.  Your wound smells bad.  You have a fever.  Your skin around the wound has a red rash that itches and burns.  You see black or yellow skin in your wound that was not there before.  You feel sick to your stomach (nauseous), throw up (vomit), and feel very tired.   This information is not intended to replace advice given to you by your health care provider. Make sure you discuss any questions you have with your health care provider.   Document Released: 10/07/2008 Document Revised: 08/01/2014  Document Reviewed: 05/22/2011 Elsevier Interactive Patient Education [employer]

## 2015-12-25 NOTE — ED Notes (Signed)
Patient states that she had right axillary abscess drained on Wed and needs packing removed.

## 2015-12-31 ENCOUNTER — Encounter: Payer: Self-pay | Admitting: Family Medicine

## 2015-12-31 ENCOUNTER — Ambulatory Visit (INDEPENDENT_AMBULATORY_CARE_PROVIDER_SITE_OTHER): Payer: Self-pay | Admitting: Family Medicine

## 2015-12-31 VITALS — BP 121/85 | HR 74 | Temp 99.6°F | Resp 16 | Ht 60.0 in | Wt 112.0 lb

## 2015-12-31 DIAGNOSIS — F17200 Nicotine dependence, unspecified, uncomplicated: Secondary | ICD-10-CM | POA: Insufficient documentation

## 2015-12-31 DIAGNOSIS — M25512 Pain in left shoulder: Secondary | ICD-10-CM

## 2015-12-31 DIAGNOSIS — Z1239 Encounter for other screening for malignant neoplasm of breast: Secondary | ICD-10-CM

## 2015-12-31 DIAGNOSIS — M62838 Other muscle spasm: Secondary | ICD-10-CM | POA: Insufficient documentation

## 2015-12-31 HISTORY — DX: Nicotine dependence, unspecified, uncomplicated: F17.200

## 2015-12-31 MED ORDER — KETOROLAC TROMETHAMINE 60 MG/2ML IM SOLN
30.0000 mg | Freq: Once | INTRAMUSCULAR | Status: AC
  Administered 2015-12-31: 30 mg via INTRAMUSCULAR

## 2015-12-31 MED ORDER — METHOCARBAMOL 500 MG PO TABS: 500.0000 mg | ORAL_TABLET | Freq: Three times a day (TID) | ORAL | Status: DC | PRN

## 2015-12-31 MED FILL — METHOCARBAMOL 500 MG TABLET: 500 | 10 days supply | Qty: 30 | Fill #0
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
## 2016-01-01 LAB — SEDIMENTATION RATE: SED RATE: 1 mm/h (ref 0–20)

## 2016-01-07 ENCOUNTER — Ambulatory Visit: Payer: Self-pay

## 2016-01-13 ENCOUNTER — Ambulatory Visit: Payer: Self-pay

## 2016-02-04 NOTE — ED Provider Notes (Signed)
Medical screening examination/treatment/procedure(s) were performed by non-physician practitioner and as supervising physician I was immediately available for consultation/collaboration.   EKG Interpretation None       Goebel Hellums, MD 02/04/16 1429 

## 2016-02-17 ENCOUNTER — Ambulatory Visit: Payer: Self-pay | Admitting: Family Medicine

## 2016-03-18 ENCOUNTER — Emergency Department (HOSPITAL_COMMUNITY)
Admission: EM | Admit: 2016-03-18 | Discharge: 2016-03-18 | Disposition: A | Discharge status: Home / Self Care | Payer: Self-pay | Attending: Emergency Medicine | Admitting: Emergency Medicine

## 2016-03-18 ENCOUNTER — Encounter (HOSPITAL_COMMUNITY): Payer: Self-pay | Admitting: Emergency Medicine

## 2016-03-18 DIAGNOSIS — L0231 Cutaneous abscess of buttock: Secondary | ICD-10-CM | POA: Insufficient documentation

## 2016-03-18 DIAGNOSIS — F17210 Nicotine dependence, cigarettes, uncomplicated: Secondary | ICD-10-CM | POA: Insufficient documentation

## 2016-03-18 DIAGNOSIS — Z791 Long term (current) use of non-steroidal anti-inflammatories (NSAID): Secondary | ICD-10-CM | POA: Insufficient documentation

## 2016-03-18 MED ORDER — TRAMADOL HCL 50 MG PO TABS: 50.0000 mg | ORAL_TABLET | Freq: Four times a day (QID) | ORAL | 0 refills | Status: DC | PRN

## 2016-03-18 MED ORDER — LIDOCAINE-EPINEPHRINE 2 %-1:100000 IJ SOLN
20.0000 mL | Freq: Once | INTRAMUSCULAR | Status: AC
  Administered 2016-03-18: 20 mL via INTRADERMAL
  Filled 2016-03-18: qty 1

## 2016-03-18 MED FILL — traMADol HCL 50 MG TABS: 50 | 3 days supply | Qty: 15 | Fill #0

## 2016-03-18 NOTE — Discharge Instructions (Signed)
Continue using warm compress or warm bath several times daily at the affected site to encourage good healing.  Follow up with your doctor in 48 hrs for wound recheck and packing removal.  Take tramadol as needed for pain.

## 2016-03-18 NOTE — ED Triage Notes (Signed)
Pt has had abscess near rectal area since Monday. Pt's boyfriend tried to drain at home, but pain worse now.

## 2016-03-18 NOTE — ED Provider Notes (Signed)
[facility]-EMERGENCY DEPT Provider Note   CSN: [id number] Arrival date & time: 03/18/16  1054     History   Chief Complaint Chief Complaint  Patient presents with  . Abscess  . Rectal Pain    HPI CAIT LOCUST is a 40 y.o. female.  HPI   40 year old female presents for evaluation of rectal pain. Patient states for the past week she has had increasing pain and swelling to her right buttock near her rectum that felt similar to prior boils that she has had multiple times in the past. She described pain as a throbbing soreness sensation, moderate in intensity worsening with palpation and movement. Her boyfriend did attempt to "pop it" 2 days ago and was able to express a small amount of pustular discharge. She continues to endorse increased pain and swelling. States that she has boils on the bottom in the past as well as in her armpits which requires incision and drainage in the past. She is up-to-date with her tetanus. She denies difficulty with bowel movement or having rectal pain. She denies having fever. She has been using warm compress without adequate relief.  Past Medical History:  Diagnosis Date  . Left shoulder pain     Patient Active Problem List   Diagnosis Date Noted  . Muscle spasm of left shoulder 12/31/2015  . Left shoulder pain 12/31/2015  . Tobacco dependence 12/31/2015    Past Surgical History:  Procedure Laterality Date  . CESAREAN SECTION    . TUBAL LIGATION      OB History    No data available       Home Medications    Prior to Admission medications   Medication Sig Start Date End Date Taking? Authorizing Provider  ibuprofen (ADVIL,MOTRIN) 200 MG tablet Take 400 mg by mouth every 6 (six) hours as needed for headache or mild pain.   Yes Historical Provider, MD  meloxicam (MOBIC) 15 MG tablet Take 1 tablet (15 mg total) by mouth daily. TAKE WITH MEALS Patient not taking: Reported on 03/18/2016 12/11/15   Mercedes Camprubi-Soms, PA-C  methocarbamol  (ROBAXIN) 500 MG tablet Take 1 tablet (500 mg total) by mouth every 8 (eight) hours as needed for muscle spasms. Patient not taking: Reported on 03/18/2016 12/31/15   Massie Maroon, FNP    Family History History reviewed. No pertinent family history.  Social History Social History  Substance Use Topics  . Smoking status: Current Every Day Smoker    Packs/day: 1.00    Years: 15.00    Types: Cigarettes  . Smokeless tobacco: Never Used  . Alcohol use No     Allergies   Review of patient's allergies indicates no known allergies.   Review of Systems Review of Systems  Constitutional: Negative for fever.  Gastrointestinal: Negative for abdominal pain and rectal pain.  Genitourinary: Negative for dysuria and pelvic pain.  Neurological: Negative for numbness.     Physical Exam Updated Vital Signs BP 154/82 (BP Location: Right Arm)   Pulse 89   Temp 97.8 F (36.6 C) (Oral)   Resp 18   SpO2 98%   Physical Exam  Constitutional: She appears well-developed and well-nourished. No distress.  HENT:  Head: Atraumatic.  Eyes: Conjunctivae are normal.  Neck: Neck supple.  Abdominal: Soft. There is no tenderness.  Genitourinary:  Genitourinary Comments: Chaperone present during exam. An area of induration and fluctuance noted to right buttock medially adjacent to rectum without rectal involvement. Exquisite tenderness to palpation. On digital rectal exam  patient has normal rectal tone no obvious mass and no evidence of anal fissure or perirectal abscess.  Neurological: She is alert.  Skin: No rash noted. No erythema.  Psychiatric: She has a normal mood and affect.  Nursing note and vitals reviewed.    ED Treatments / Results  Labs (all labs ordered are listed, but only abnormal results are displayed) Labs Reviewed - No data to display  EKG  EKG Interpretation None       Radiology No results found.  Procedures Procedures (including critical care time)  INCISION  AND DRAINAGE Performed by: Fayrene HelperRAN,Favio Moder Consent: Verbal consent obtained. Risks and benefits: risks, benefits and alternatives were discussed Type: abscess  Body area: R buttock, medially  Anesthesia: local infiltration  Incision was made with a scalpel.  Local anesthetic: lidocaine 2% 2 epinephrine  Anesthetic total: 3 ml  Complexity: complex Blunt dissection to break up loculations  Drainage: purulent  Drainage amount: moderate  Packing material: 1 in iodoform gauze  Patient tolerance: Patient tolerated the procedure well with no immediate complications.     Medications Ordered in ED Medications  lidocaine-EPINEPHrine (XYLOCAINE W/EPI) 2 %-1:100000 (with pres) injection 20 mL (20 mLs Intradermal Given 03/18/16 1224)     Initial Impression / Assessment and Plan / ED Course  I have reviewed the triage vital signs and the nursing notes.  Pertinent labs & imaging results that were available during my care of the patient were reviewed by me and considered in my medical decision making (see chart for details).  Clinical Course    BP 154/82 (BP Location: Right Arm)   Pulse 89   Temp 97.8 F (36.6 C) (Oral)   Resp 18   Ht 5' (1.524 m)   Wt 49.9 kg   SpO2 98%   BMI 21.48 kg/m    Final Clinical Impressions(s) / ED Diagnoses   Final diagnoses:  Abscess of buttock, right    New Prescriptions New Prescriptions   TRAMADOL (ULTRAM) 50 MG TABLET    Take 1 tablet (50 mg total) by mouth every 6 (six) hours as needed.     Fayrene HelperBowie Toshi Ishii, PA-C 03/18/16 1333    Lavera Guiseana Duo Liu, MD 03/18/16 2011

## 2016-05-01 ENCOUNTER — Encounter (HOSPITAL_COMMUNITY): Payer: Self-pay | Admitting: Emergency Medicine

## 2016-05-01 ENCOUNTER — Emergency Department (HOSPITAL_COMMUNITY)
Admission: EM | Admit: 2016-05-01 | Discharge: 2016-05-01 | Disposition: A | Discharge status: Home / Self Care | Payer: Self-pay | Attending: Emergency Medicine | Admitting: Emergency Medicine

## 2016-05-01 DIAGNOSIS — L03011 Cellulitis of right finger: Secondary | ICD-10-CM | POA: Insufficient documentation

## 2016-05-01 DIAGNOSIS — Z79899 Other long term (current) drug therapy: Secondary | ICD-10-CM | POA: Insufficient documentation

## 2016-05-01 DIAGNOSIS — F17210 Nicotine dependence, cigarettes, uncomplicated: Secondary | ICD-10-CM | POA: Insufficient documentation

## 2016-05-01 MED ORDER — CEPHALEXIN 500 MG PO CAPS: 500.0000 mg | ORAL_CAPSULE | Freq: Four times a day (QID) | ORAL | 0 refills | Status: DC

## 2016-05-01 NOTE — ED Triage Notes (Signed)
Pt c/o swelling and pain in r/thumb. Side of thumb is red and swollen. Pt stated that she accidentally stuck a thumb tack into r/thumb 5 days ago

## 2016-05-01 NOTE — Discharge Instructions (Signed)
1. Continue home medications. 2. Start taking Keflex 4 times daily. You may also take 800 mg Ibuprofen three times daily for pain.  Perform warm soaks 3 times daily as well. 3.  Follow up with your PCP in 2-3 days for re-evaluation.  If you were given follow up with a specialist, please follow up with them accordingly. Return to the ED if you experience increased swelling, pain, drainage, fever, or any new symptoms.

## 2016-05-01 NOTE — ED Notes (Signed)
Bed: WTR9 Expected date:  Expected time:  Means of arrival:  Comments: 

## 2016-05-01 NOTE — ED Provider Notes (Signed)
[facility]-EMERGENCY DEPT Provider Note   CSN: [id number] Arrival date & time: 05/01/16  1130  By signing my name below, I, [name], attest that this documentation has been prepared under the direction and in the presence of Bear StearnsKayla Kylii Ennis, PA-C.  Electronically Signed: Octavia HeirArianna Padilla, ED Scribe. 05/01/16. 12:33 PM.    History   Chief Complaint Chief Complaint  Patient presents with  . Finger Injury    r/thumb swelling    The history is provided by the patient. No language interpreter was used.   HPI Comments: [name] is a 40 y.o. female who presents to the Emergency Department complaining of sudden onset, gradual worsening, moderate left thumb pain x 5 days. Pt has associated swelling x 1 day and erythema to the area. She notes that she placed her hand in a "junk" drawer and stuck her right thumb with a thumb tack. Pt states she has taken ibuprofen and applied neosporin to the area to alleviate her pain with no relief. No fever, chills, nausea, vomiting.   Past Medical History:  Diagnosis Date  . Left shoulder pain     Patient Active Problem List   Diagnosis Date Noted  . Muscle spasm of left shoulder 12/31/2015  . Left shoulder pain 12/31/2015  . Tobacco dependence 12/31/2015    Past Surgical History:  Procedure Laterality Date  . CESAREAN SECTION    . TUBAL LIGATION      OB History    No data available       Home Medications    Prior to Admission medications   Medication Sig Start Date End Date Taking? Authorizing Provider  cephALEXin (KEFLEX) 500 MG capsule Take 1 capsule (500 mg total) by mouth 4 (four) times daily. 05/01/16   Cheri FowlerKayla Jaliel Deavers, PA-C  ibuprofen (ADVIL,MOTRIN) 200 MG tablet Take 400 mg by mouth every 6 (six) hours as needed for headache or mild pain.    Historical Provider, MD  meloxicam (MOBIC) 15 MG tablet Take 1 tablet (15 mg total) by mouth daily. TAKE WITH MEALS Patient not taking: Reported on 03/18/2016 12/11/15   Mercedes Camprubi-Soms,  PA-C  methocarbamol (ROBAXIN) 500 MG tablet Take 1 tablet (500 mg total) by mouth every 8 (eight) hours as needed for muscle spasms. Patient not taking: Reported on 03/18/2016 12/31/15   Massie MaroonLachina M Hollis, FNP  traMADol (ULTRAM) 50 MG tablet Take 1 tablet (50 mg total) by mouth every 6 (six) hours as needed. 03/18/16   Fayrene HelperBowie Tran, PA-C    Family History Family History  Problem Relation Age of Onset  . Cancer Father     Social History Social History  Substance Use Topics  . Smoking status: Current Every Day Smoker    Packs/day: 1.00    Years: 15.00    Types: Cigarettes  . Smokeless tobacco: Never Used  . Alcohol use No     Allergies   Review of patient's allergies indicates no known allergies.   Review of Systems Review of Systems  Musculoskeletal: Positive for joint swelling and myalgias.  All other systems reviewed and are negative.    Physical Exam Updated Vital Signs BP 129/90 (BP Location: Right Arm)   Pulse 99   Temp 98.7 F (37.1 C) (Oral)   Resp 18   LMP 05/01/2016 (Exact Date)   SpO2 100%   Physical Exam  Constitutional: She is oriented to person, place, and time. She appears well-developed and well-nourished.  HENT:  Head: Normocephalic and atraumatic.  Right Ear: External ear normal.  Left Ear: External ear normal.  Eyes: Conjunctivae are normal. No scleral icterus.  Neck: No tracheal deviation present.  Cardiovascular:  Brisk capillary refill.   Pulmonary/Chest: Effort normal. No respiratory distress.  Abdominal: She exhibits no distension.  Musculoskeletal: Normal range of motion.  Full AROM of right thumb without pain.   Neurological: She is alert and oriented to person, place, and time.  Skin: Skin is warm and dry.  Mild erythema, induration, and tenderness to medial right thumb without fluctuance.    Psychiatric: She has a normal mood and affect. Her behavior is normal.     ED Treatments / Results  DIAGNOSTIC STUDIES: Oxygen Saturation is  100% on RA, normal by my interpretation.  COORDINATION OF CARE:  12:29 PM Discussed treatment plan which includes antibiotics with pt at bedside and pt agreed to plan.  Labs (all labs ordered are listed, but only abnormal results are displayed) Labs Reviewed - No data to display  EKG  EKG Interpretation None       Radiology No results found.  Procedures Procedures (including critical care time)  Medications Ordered in ED Medications - No data to display   Initial Impression / Assessment and Plan / ED Course  I have reviewed the triage vital signs and the nursing notes.  Pertinent labs & imaging results that were available during my care of the patient were reviewed by me and considered in my medical decision making (see chart for details).  Clinical Course   Patient presentation consistent with cellulitis. Afebrile. No tachycardia, hypotension or other symptoms suggestive of severe infection. Will discharge with keflex and recommend warm soaks. Return precautions discussed. Pt appears safe for discharge.   Final Clinical Impressions(s) / ED Diagnoses   Final diagnoses:  Cellulitis of finger of right hand    New Prescriptions New Prescriptions   CEPHALEXIN (KEFLEX) 500 MG CAPSULE    Take 1 capsule (500 mg total) by mouth 4 (four) times daily.     Cheri Fowler, PA-C 05/01/16 1237    Canary Brim Tegeler, MD 05/01/16 [phone]

## 2016-05-02 ENCOUNTER — Encounter (HOSPITAL_COMMUNITY): Payer: Self-pay | Admitting: Emergency Medicine

## 2016-05-02 ENCOUNTER — Emergency Department (HOSPITAL_COMMUNITY)
Admission: EM | Admit: 2016-05-02 | Discharge: 2016-05-02 | Disposition: A | Discharge status: Home / Self Care | Payer: Self-pay | Attending: Emergency Medicine | Admitting: Emergency Medicine

## 2016-05-02 DIAGNOSIS — Z79899 Other long term (current) drug therapy: Secondary | ICD-10-CM | POA: Insufficient documentation

## 2016-05-02 DIAGNOSIS — L03011 Cellulitis of right finger: Secondary | ICD-10-CM | POA: Insufficient documentation

## 2016-05-02 DIAGNOSIS — F17210 Nicotine dependence, cigarettes, uncomplicated: Secondary | ICD-10-CM | POA: Insufficient documentation

## 2016-05-02 MED ORDER — LIDOCAINE HCL (PF) 1 % IJ SOLN
30.0000 mL | Freq: Once | INTRAMUSCULAR | Status: AC
  Administered 2016-05-02: 5 mL via INTRADERMAL
  Filled 2016-05-02: qty 30

## 2016-05-02 MED ORDER — IBUPROFEN 800 MG PO TABS: 800.0000 mg | ORAL_TABLET | Freq: Three times a day (TID) | ORAL | 0 refills | Status: DC

## 2016-05-02 NOTE — ED Triage Notes (Signed)
Pt seen here yesterday for swelling and pain in R thumb after sticking a thumb tack into her R thumb 5 days ago. Pt was given prescription for antibiotics. Pt has taken one day of antibiotics and returns because it still hurts and hasn't gotten any better. Pt A&Ox4 and ambulatory.

## 2016-05-02 NOTE — ED Provider Notes (Signed)
[facility]-EMERGENCY DEPT Provider Note   CSN: [id number] Arrival date & time: 05/02/16  1356     History   Chief Complaint Chief Complaint  Patient presents with  . finger infection    HPI [name] is a 40 y.o. female.  HPI Suzanne Niemannisha N Sisley is a 40 y.o. female with no significant PMH who presents with 6 days of left thumb pain with associated swelling and erythema.  Patient seen yesterday for same and prescribed Keflex and warm soaks.  She returns today for increased swelling and pus formation.  No fever, chills, numbness, weakness.  She stuck her thumb on a thumb tack 6 days ago.  Past Medical History:  Diagnosis Date  . Left shoulder pain     Patient Active Problem List   Diagnosis Date Noted  . Muscle spasm of left shoulder 12/31/2015  . Left shoulder pain 12/31/2015  . Tobacco dependence 12/31/2015    Past Surgical History:  Procedure Laterality Date  . CESAREAN SECTION    . TUBAL LIGATION      OB History    No data available       Home Medications    Prior to Admission medications   Medication Sig Start Date End Date Taking? Authorizing Provider  cephALEXin (KEFLEX) 500 MG capsule Take 1 capsule (500 mg total) by mouth 4 (four) times daily. 05/01/16   Cheri FowlerKayla Imajean Mcdermid, PA-C  ibuprofen (ADVIL,MOTRIN) 800 MG tablet Take 1 tablet (800 mg total) by mouth 3 (three) times daily. 05/02/16   Cheri FowlerKayla Ladarrion Telfair, PA-C  meloxicam (MOBIC) 15 MG tablet Take 1 tablet (15 mg total) by mouth daily. TAKE WITH MEALS Patient not taking: Reported on 03/18/2016 12/11/15   Mercedes Camprubi-Soms, PA-C  methocarbamol (ROBAXIN) 500 MG tablet Take 1 tablet (500 mg total) by mouth every 8 (eight) hours as needed for muscle spasms. Patient not taking: Reported on 03/18/2016 12/31/15   Massie MaroonLachina M Hollis, FNP  traMADol (ULTRAM) 50 MG tablet Take 1 tablet (50 mg total) by mouth every 6 (six) hours as needed. 03/18/16   Fayrene HelperBowie Tran, PA-C    Family History Family History  Problem Relation Age of Onset    . Cancer Father     Social History Social History  Substance Use Topics  . Smoking status: Current Every Day Smoker    Packs/day: 1.00    Years: 15.00    Types: Cigarettes  . Smokeless tobacco: Never Used  . Alcohol use No     Allergies   Review of patient's allergies indicates no known allergies.   Review of Systems Review of Systems All other systems negative unless otherwise stated in HPI   Physical Exam Updated Vital Signs BP 124/80 (BP Location: Right Arm)   Pulse 89   Temp 98.5 F (36.9 C) (Oral)   Resp 18   LMP 05/01/2016 (Exact Date)   SpO2 100%   Physical Exam  Constitutional: She is oriented to person, place, and time. She appears well-developed and well-nourished.  HENT:  Head: Normocephalic and atraumatic.  Right Ear: External ear normal.  Left Ear: External ear normal.  Eyes: Conjunctivae are normal. No scleral icterus.  Neck: No tracheal deviation present.  Cardiovascular:  Brisk capillary refill.   Pulmonary/Chest: Effort normal. No respiratory distress.  Abdominal: She exhibits no distension.  Musculoskeletal: Normal range of motion.  Neurological: She is alert and oriented to person, place, and time.  Skin: Skin is warm and dry.  Paronychia to right lateral thumb nail fold.  Psychiatric:  She has a normal mood and affect. Her behavior is normal.     ED Treatments / Results  Labs (all labs ordered are listed, but only abnormal results are displayed) Labs Reviewed - No data to display  EKG  EKG Interpretation None       Radiology No results found.  Procedures Procedures (including critical care time)  NERVE BLOCK Performed by: Cheri Fowler Consent: Verbal consent obtained. Required items: required blood products, implants, devices, and special equipment available Time out: Immediately prior to procedure a "time out" was called to verify the correct patient, procedure, equipment, support staff and site/side marked as  required.  Indication: I&D Nerve block body site: right thumb  Preparation: Patient was prepped and draped in the usual sterile fashion. Needle gauge: 27 G Location technique: anatomical landmarks  Local anesthetic: Lidocaine 1% without epinephrine  Anesthetic total: 8 ml  Outcome: pain improved Patient tolerance: Patient tolerated the procedure well with no immediate complications.  INCISION AND DRAINAGE Performed by: Cheri Fowler Consent: Verbal consent obtained. Risks and benefits: risks, benefits and alternatives were discussed Type: abscess  Body area: right thumb  Anesthesia: digital block  Incision was made with a scalpel.  Complexity: complex Blunt dissection to break up loculations  Drainage: purulent  Drainage amount: mild  Packing material: none  Patient tolerance: Patient tolerated the procedure well with no immediate complications.     Medications Ordered in ED Medications  lidocaine (PF) (XYLOCAINE) 1 % injection 30 mL (5 mLs Intradermal Given 05/02/16 1503)     Initial Impression / Assessment and Plan / ED Course  I have reviewed the triage vital signs and the nursing notes.  Pertinent labs & imaging results that were available during my care of the patient were reviewed by me and considered in my medical decision making (see chart for details).  Clinical Course    Findings c/w paronychia.  I&D performed.  Continue Keflex and warm soaks.  Return 2 days for wound check. Return precautions discussed.  Stable for discharge.  Final Clinical Impressions(s) / ED Diagnoses   Final diagnoses:  Paronychia of thumb, right    New Prescriptions New Prescriptions   IBUPROFEN (ADVIL,MOTRIN) 800 MG TABLET    Take 1 tablet (800 mg total) by mouth 3 (three) times daily.     Cheri Fowler, PA-C 05/02/16 1610    Bethann Berkshire, MD 05/02/16 2221

## 2016-05-04 MED FILL — IBUPROFEN 800 MG TABLET: 800 | 7 days supply | Qty: 21 | Fill #0

## 2016-05-15 ENCOUNTER — Emergency Department (HOSPITAL_COMMUNITY)
Admission: EM | Admit: 2016-05-15 | Discharge: 2016-05-15 | Disposition: A | Discharge status: Home / Self Care | Payer: Self-pay | Attending: Emergency Medicine | Admitting: Emergency Medicine

## 2016-05-15 ENCOUNTER — Encounter (HOSPITAL_COMMUNITY): Payer: Self-pay | Admitting: *Deleted

## 2016-05-15 DIAGNOSIS — F17210 Nicotine dependence, cigarettes, uncomplicated: Secondary | ICD-10-CM | POA: Insufficient documentation

## 2016-05-15 DIAGNOSIS — X58XXXD Exposure to other specified factors, subsequent encounter: Secondary | ICD-10-CM | POA: Insufficient documentation

## 2016-05-15 DIAGNOSIS — M79644 Pain in right finger(s): Secondary | ICD-10-CM

## 2016-05-15 DIAGNOSIS — S61001D Unspecified open wound of right thumb without damage to nail, subsequent encounter: Secondary | ICD-10-CM | POA: Insufficient documentation

## 2016-05-15 DIAGNOSIS — S61001A Unspecified open wound of right thumb without damage to nail, initial encounter: Secondary | ICD-10-CM

## 2016-05-15 NOTE — ED Notes (Signed)
Declined W/C at D/C and was escorted to lobby by RN. 

## 2016-05-15 NOTE — Discharge Instructions (Addendum)
Keep your thumb clean and dry. Apply antibiotic ointment once daily and a clean bandage. Do not submerge your thumb in a pool, hot tub, lake or stream. Follow-up with your primary care provider in 2-3 days if you're thumb is not looking better.  Return immediately to the emergency department if you notice swelling, pain, redness, and firmness to the pad of your thumb, signs of infection around the wound to include foul discharge, redness, pain, swelling, red streaks, fever or any other concerning symptoms.

## 2016-05-15 NOTE — ED Provider Notes (Signed)
[facility]-EMERGENCY DEPT Provider Note   CSN: [id number] Arrival date & time: 05/15/16  1523  By signing my name below, I, Rosario AdieWilliam Andrew Hiatt, attest that this documentation has been prepared under the direction and in the presence of Mattie MarlinJessica Justeen Hehr, PA-C.  Electronically Signed: Rosario AdieWilliam Andrew Hiatt, ED Scribe. 05/15/16. 4:43 PM.  History   Chief Complaint Chief Complaint  Patient presents with  . Finger Injury   Patient gave verbal permission to utilize photo for medical documentation only. The image was not stored on any personal device.  The history is provided by the patient. No language interpreter was used.   HPI Comments: [name] is a 40 y.o. female who presents to the Emergency Department complaining of recurent, throbbing left first digit pain w/ associated mild redness onset ~2 weeks ago, worsening since last night. Per prior chart review, pt was seen twice on 05/01/16 and 05/02/16 for same and at that time she was dx'd w/ Paronychia of the left first digit. Pt had an I&D performed to the area at that time and d/c home with a course of Keflex. Pt finished her course of antibiotics and notes that her symptoms were improving since her I&D; however, last night she removed a piece of skin to the area causing her pain and redness return. Her pain to the area is exacerbated with air moving over the wound. Pt is a current, everyday smoker. No h/o of DM/autoimmune diseases, or any recent steroid usage. Denies numbness/paraesthesias to the area, weakness, increased warmth, fevers, chills, vomiting, or any other associated symptoms.   Past Medical History:  Diagnosis Date  . Left shoulder pain    Patient Active Problem List   Diagnosis Date Noted  . Muscle spasm of left shoulder 12/31/2015  . Left shoulder pain 12/31/2015  . Tobacco dependence 12/31/2015   Past Surgical History:  Procedure Laterality Date  . CESAREAN SECTION    . TUBAL LIGATION     OB History    No data  available     Home Medications    Prior to Admission medications   Medication Sig Start Date End Date Taking? Authorizing Provider  cephALEXin (KEFLEX) 500 MG capsule Take 1 capsule (500 mg total) by mouth 4 (four) times daily. 05/01/16   Cheri FowlerKayla Rose, PA-C  ibuprofen (ADVIL,MOTRIN) 800 MG tablet Take 1 tablet (800 mg total) by mouth 3 (three) times daily. 05/02/16   Cheri FowlerKayla Rose, PA-C  meloxicam (MOBIC) 15 MG tablet Take 1 tablet (15 mg total) by mouth daily. TAKE WITH MEALS Patient not taking: Reported on 03/18/2016 12/11/15   Mercedes Camprubi-Soms, PA-C  methocarbamol (ROBAXIN) 500 MG tablet Take 1 tablet (500 mg total) by mouth every 8 (eight) hours as needed for muscle spasms. Patient not taking: Reported on 03/18/2016 12/31/15   Massie MaroonLachina M Hollis, FNP  traMADol (ULTRAM) 50 MG tablet Take 1 tablet (50 mg total) by mouth every 6 (six) hours as needed. 03/18/16   Fayrene HelperBowie Tran, PA-C   Family History Family History  Problem Relation Age of Onset  . Cancer Father    Social History Social History  Substance Use Topics  . Smoking status: Current Every Day Smoker    Packs/day: 1.00    Years: 15.00    Types: Cigarettes  . Smokeless tobacco: Never Used  . Alcohol use No   Allergies   Review of patient's allergies indicates no known allergies.  Review of Systems Review of Systems  Constitutional: Negative for chills and fever.  Gastrointestinal: Negative  for vomiting.  Musculoskeletal: Positive for myalgias.  Skin: Positive for color change and wound.  Neurological: Negative for weakness and numbness.   Physical Exam Updated Vital Signs BP 139/96 (BP Location: Left Arm)   Pulse 94   Temp 98 F (36.7 C) (Oral)   Resp 20   Wt 109 lb 2 oz (49.5 kg)   LMP 05/01/2016 (Exact Date)   SpO2 99%   BMI 21.31 kg/m   Physical Exam  Constitutional: She appears well-developed and well-nourished. No distress.  HENT:  Head: Normocephalic and atraumatic.  Eyes: Conjunctivae are normal.    Cardiovascular:  Pulses:      Radial pulses are 2+ on the left side.  Pulmonary/Chest: Effort normal. No respiratory distress.  Musculoskeletal: Normal range of motion.  Left first digit with healing wound s/p prior I&D w/ some mild surrounding erythema. No drainage from the area. No red streaks. No increased warmth. No area of fluctuance. Sensation intact. Finger pad is soft and not swollen. Brisk capillary refill. Full ROM of the digit. See attached image.   Neurological: She is alert. Coordination normal.  Skin: Skin is warm and dry. She is not diaphoretic.  Psychiatric: She has a normal mood and affect. Her behavior is normal.  Nursing note and vitals reviewed.     ED Treatments / Results  DIAGNOSTIC STUDIES: Oxygen Saturation is 99% on RA, normal by my interpretation.   COORDINATION OF CARE: 4:43 PM-Discussed next steps with pt. Pt verbalized understanding and is agreeable with the plan.   Labs (all labs ordered are listed, but only abnormal results are displayed) Labs Reviewed - No data to display  Radiology No results found.  Procedures Procedures   Medications Ordered in ED Medications - No data to display  Initial Impression / Assessment and Plan / ED Course  I have reviewed the triage vital signs and the nursing notes.  Pertinent labs & imaging results that were available during my care of the patient were reviewed by me and considered in my medical decision making (see chart for details).  Clinical Course   Patient with open wound. She removed skin last night exposing healing wound. No signs of surrounding cellulitis or signs of infection. No area of fluctuance or abscess to drain. Patient is neurovascular intact distally. Patient afebrile, VSS. Patient to be discharged home with proper wound care instructions. Discussed strict return precautions. Discussed follow-up with PCP or here in ED in 2-3 days if symptoms are not improving. Patient appears reliable and  expressed  understanding to the discharge instructions.  Final Clinical Impressions(s) / ED Diagnoses   Final diagnoses:  Pain of right thumb  Open wound of right thumb, initial encounter   New Prescriptions Discharge Medication List as of 05/15/2016  5:12 PM     I personally performed the services described in this documentation, which was scribed in my presence. The recorded information has been reviewed and is accurate.       Jerre Simon, PA 05/15/16 1754    Marily Memos, MD 05/15/16 [phone]

## 2016-05-15 NOTE — ED Triage Notes (Signed)
[facility] for finger [facility] - see note. Pt reports finishing antibiotics, finger has worsened. Pt wants finger reevaluated.

## 2016-08-04 ENCOUNTER — Encounter: Payer: Self-pay | Admitting: Family Medicine

## 2016-08-04 ENCOUNTER — Ambulatory Visit (INDEPENDENT_AMBULATORY_CARE_PROVIDER_SITE_OTHER): Payer: Self-pay | Admitting: Family Medicine

## 2016-08-04 VITALS — BP 135/90 | HR 87 | Temp 98.6°F | Resp 16 | Ht 60.0 in | Wt 114.0 lb

## 2016-08-04 DIAGNOSIS — Z23 Encounter for immunization: Secondary | ICD-10-CM

## 2016-08-04 DIAGNOSIS — R5383 Other fatigue: Secondary | ICD-10-CM

## 2016-08-04 DIAGNOSIS — R634 Abnormal weight loss: Secondary | ICD-10-CM

## 2016-08-04 DIAGNOSIS — M792 Neuralgia and neuritis, unspecified: Secondary | ICD-10-CM

## 2016-08-04 DIAGNOSIS — M25512 Pain in left shoulder: Secondary | ICD-10-CM

## 2016-08-04 DIAGNOSIS — S4992XA Unspecified injury of left shoulder and upper arm, initial encounter: Secondary | ICD-10-CM | POA: Insufficient documentation

## 2016-08-04 DIAGNOSIS — G8929 Other chronic pain: Secondary | ICD-10-CM

## 2016-08-04 DIAGNOSIS — M62838 Other muscle spasm: Secondary | ICD-10-CM

## 2016-08-04 DIAGNOSIS — F17200 Nicotine dependence, unspecified, uncomplicated: Secondary | ICD-10-CM

## 2016-08-04 DIAGNOSIS — S4992XS Unspecified injury of left shoulder and upper arm, sequela: Secondary | ICD-10-CM

## 2016-08-04 LAB — COMPLETE METABOLIC PANEL WITH GFR
ALT: 14 U/L (ref 6–29)
AST: 13 U/L (ref 10–30)
Albumin: 4.3 g/dL (ref 3.6–5.1)
Alkaline Phosphatase: 47 U/L (ref 33–115)
BUN: 13 mg/dL (ref 7–25)
CALCIUM: 9.2 mg/dL (ref 8.6–10.2)
CHLORIDE: 104 mmol/L (ref 98–110)
CO2: 26 mmol/L (ref 20–31)
Creat: 0.89 mg/dL (ref 0.50–1.10)
GFR, Est African American: >89 mL/min (ref >=60)
GFR, Est Non African American: 81 mL/min (ref >=60)
Glucose, Bld: 84 mg/dL (ref 65–99)
POTASSIUM: 3.7 mmol/L (ref 3.5–5.3)
SODIUM: 139 mmol/L (ref 135–146)
Total Bilirubin: 0.4 mg/dL (ref 0.2–1.2)
Total Protein: 7.1 g/dL (ref 6.1–8.1)

## 2016-08-04 LAB — CBC WITH DIFFERENTIAL/PLATELET
Basophils Absolute: 0 cells/uL (ref 0–200)
Basophils Relative: 0 %
EOS PCT: 0 %
Eosinophils Absolute: 0 cells/uL — ABNORMAL LOW (ref 15–500)
HEMATOCRIT: 38.7 % (ref 35.0–45.0)
Hemoglobin: 12.8 g/dL (ref 11.7–15.5)
LYMPHS PCT: 37 %
Lymphs Abs: 2109 cells/uL (ref 850–3900)
MCH: 32.8 pg (ref 27.0–33.0)
MCHC: 33.1 g/dL (ref 32.0–36.0)
MCV: 99.2 fL (ref 80.0–100.0)
MONOS PCT: 7 %
MPV: 9.4 fL (ref 7.5–12.5)
Monocytes Absolute: 399 cells/uL (ref 200–950)
Neutro Abs: 3192 cells/uL (ref 1500–7800)
Neutrophils Relative %: 56 %
PLATELETS: 229 10*3/uL (ref 140–400)
RBC: 3.9 MIL/uL (ref 3.80–5.10)
RDW: 13.8 % (ref 11.0–15.0)
WBC: 5.7 10*3/uL (ref 3.8–10.8)

## 2016-08-04 MED ORDER — CYCLOBENZAPRINE HCL 5 MG PO TABS: 5.0000 mg | ORAL_TABLET | Freq: Three times a day (TID) | ORAL | 1 refills | Status: DC | PRN

## 2016-08-04 MED ORDER — GABAPENTIN 100 MG PO CAPS: 100.0000 mg | ORAL_CAPSULE | Freq: Three times a day (TID) | ORAL | 0 refills | Status: DC

## 2016-08-04 MED ORDER — KETOROLAC TROMETHAMINE 60 MG/2ML IM SOLN
30.0000 mg | Freq: Once | INTRAMUSCULAR | Status: AC
  Administered 2016-08-04: 30 mg via INTRAMUSCULAR

## 2016-08-04 MED FILL — CYCLOBENZAPRINE 5 MG TABLET: 5 | 10 days supply | Qty: 30 | Fill #0

## 2016-08-04 MED FILL — ?GABAPENTIN 100 MG CAP: 30 days supply | Qty: 90 | Fill #0
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
## 2016-08-04 NOTE — Patient Instructions (Addendum)
Do not lift any objects over 10 pounds Apply ice 20 minutes 4 times per day  Use interchangeably with heat therapy  Uses muscle relaxants for muscle spasms every 8 hours as needed    Muscle Cramps and Spasms Muscle cramps and spasms are when muscles tighten by themselves. They usually get better within minutes. Muscle cramps are painful. They are usually stronger and last longer than muscle spasms. Muscle spasms may or may not be painful. They can last a few seconds or much longer. HOME CARE  Drink enough fluid to keep your pee (urine) clear or pale yellow.  Massage, stretch, and relax the muscle.  Use a warm towel, heating pad, or warm shower water on tight muscles.  Place ice on the muscle if it is tender or in pain.  Put ice in a plastic bag.  Place a towel between your skin and the bag.  Leave the ice on for 15-20 minutes, 3-4 times a day.  Only take medicine as told by your doctor. GET HELP RIGHT AWAY IF:  Your cramps or spasms get worse, happen more often, or do not get better with time. MAKE SURE YOU:  Understand these instructions.  Will watch your condition.  Will get help right away if you are not doing well or get worse. This information is not intended to replace advice given to you by your health care provider. Make sure you discuss any questions you have with your health care provider. Document Released: 06/23/2008 Document Revised: 11/05/2012 Document Reviewed: 04/14/2015 Elsevier Interactive Patient Education  2017 [employer]

## 2016-08-05 LAB — HEMOGLOBIN A1C
HEMOGLOBIN A1C: 4.6 % (ref <5.7)
Mean Plasma Glucose: 85 mg/dL

## 2016-08-05 LAB — HIV ANTIBODY (ROUTINE TESTING W REFLEX): HIV 1&2 Ab, 4th Generation: NONREACTIVE

## 2016-08-17 MED FILL — CYCLOBENZAPRINE 5 MG TABLET: 5 | 10 days supply | Qty: 30 | Fill #1

## 2016-08-18 ENCOUNTER — Ambulatory Visit: Payer: Self-pay | Attending: Internal Medicine

## 2016-09-05 ENCOUNTER — Telehealth: Payer: Self-pay

## 2016-09-05 NOTE — Telephone Encounter (Signed)
Is this ok to refill? Patient last seen 08/04/2016.

## 2016-09-06 ENCOUNTER — Other Ambulatory Visit: Payer: Self-pay | Admitting: Family Medicine

## 2016-09-06 DIAGNOSIS — M62838 Other muscle spasm: Secondary | ICD-10-CM

## 2016-09-06 MED FILL — CYCLOBENZAPRINE 5 MG TABLET: 5 | 10 days supply | Qty: 30 | Fill #0

## 2016-09-08 ENCOUNTER — Encounter: Payer: No Typology Code available for payment source | Admitting: Family Medicine

## 2016-09-08 ENCOUNTER — Encounter: Payer: Self-pay | Admitting: Family Medicine

## 2016-09-09 NOTE — Progress Notes (Signed)
This encounter was created in error - please disregard.

## 2016-09-15 ENCOUNTER — Other Ambulatory Visit: Payer: Self-pay | Admitting: Family Medicine

## 2016-09-15 DIAGNOSIS — M792 Neuralgia and neuritis, unspecified: Secondary | ICD-10-CM

## 2016-09-21 MED FILL — ?GABAPENTIN 100 MG CAP: 30 days supply | Qty: 90 | Fill #0

## 2016-09-22 ENCOUNTER — Encounter: Payer: No Typology Code available for payment source | Admitting: Family Medicine

## 2016-09-22 ENCOUNTER — Encounter: Payer: Self-pay | Admitting: Family Medicine

## 2016-09-22 MED FILL — CYCLOBENZAPRINE 5 MG TABLET: 5 | 10 days supply | Qty: 30 | Fill #1

## 2016-09-23 NOTE — Progress Notes (Signed)
This encounter was created in error - please disregard.

## 2016-09-26 ENCOUNTER — Ambulatory Visit (INDEPENDENT_AMBULATORY_CARE_PROVIDER_SITE_OTHER): Payer: No Typology Code available for payment source | Admitting: Family Medicine

## 2016-09-26 ENCOUNTER — Encounter: Payer: Self-pay | Admitting: Family Medicine

## 2016-09-26 VITALS — BP 156/94 | HR 82 | Temp 98.4°F | Resp 16 | Ht 60.0 in | Wt 116.0 lb

## 2016-09-26 DIAGNOSIS — M62838 Other muscle spasm: Secondary | ICD-10-CM

## 2016-09-26 DIAGNOSIS — M25512 Pain in left shoulder: Secondary | ICD-10-CM

## 2016-09-26 DIAGNOSIS — M6790 Unspecified disorder of synovium and tendon, unspecified site: Secondary | ICD-10-CM

## 2016-09-26 DIAGNOSIS — M6780 Other specified disorders of synovium and tendon, unspecified site: Secondary | ICD-10-CM

## 2016-09-26 DIAGNOSIS — G8929 Other chronic pain: Secondary | ICD-10-CM

## 2016-09-26 DIAGNOSIS — M25819 Other specified joint disorders, unspecified shoulder: Secondary | ICD-10-CM

## 2016-09-26 DIAGNOSIS — S4992XS Unspecified injury of left shoulder and upper arm, sequela: Secondary | ICD-10-CM

## 2016-09-26 DIAGNOSIS — M7552 Bursitis of left shoulder: Secondary | ICD-10-CM

## 2016-09-26 MED ORDER — IBUPROFEN 800 MG PO TABS: 800.0000 mg | ORAL_TABLET | Freq: Three times a day (TID) | ORAL | 0 refills | Status: DC

## 2016-09-26 MED ORDER — CYCLOBENZAPRINE HCL 5 MG PO TABS: ORAL_TABLET | ORAL | 1 refills | Status: DC
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
## 2016-09-27 ENCOUNTER — Ambulatory Visit: Payer: No Typology Code available for payment source | Attending: Family Medicine

## 2016-09-27 ENCOUNTER — Telehealth: Payer: Self-pay

## 2016-09-27 DIAGNOSIS — M25612 Stiffness of left shoulder, not elsewhere classified: Secondary | ICD-10-CM | POA: Insufficient documentation

## 2016-09-27 DIAGNOSIS — R252 Cramp and spasm: Secondary | ICD-10-CM | POA: Insufficient documentation

## 2016-09-27 DIAGNOSIS — M25512 Pain in left shoulder: Secondary | ICD-10-CM | POA: Insufficient documentation

## 2016-09-27 DIAGNOSIS — G8929 Other chronic pain: Secondary | ICD-10-CM | POA: Insufficient documentation

## 2016-09-27 DIAGNOSIS — M7552 Bursitis of left shoulder: Secondary | ICD-10-CM | POA: Insufficient documentation

## 2016-09-27 DIAGNOSIS — M6281 Muscle weakness (generalized): Secondary | ICD-10-CM | POA: Insufficient documentation

## 2016-09-27 DIAGNOSIS — M6780 Other specified disorders of synovium and tendon, unspecified site: Secondary | ICD-10-CM

## 2016-09-27 DIAGNOSIS — M6790 Unspecified disorder of synovium and tendon, unspecified site: Secondary | ICD-10-CM | POA: Insufficient documentation
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
## 2016-09-27 NOTE — Telephone Encounter (Signed)
Called and spoke with a female. He states patient was in another appointment. I advised that she had called and we were referring her call to please have her call back when available. Thanks!

## 2016-09-28 ENCOUNTER — Emergency Department (HOSPITAL_COMMUNITY)
Admission: EM | Admit: 2016-09-28 | Discharge: 2016-09-28 | Disposition: A | Discharge status: Home / Self Care | Payer: No Typology Code available for payment source | Attending: Emergency Medicine | Admitting: Emergency Medicine

## 2016-09-28 ENCOUNTER — Encounter (HOSPITAL_COMMUNITY): Payer: Self-pay

## 2016-09-28 DIAGNOSIS — G8929 Other chronic pain: Secondary | ICD-10-CM | POA: Insufficient documentation

## 2016-09-28 DIAGNOSIS — Z79899 Other long term (current) drug therapy: Secondary | ICD-10-CM | POA: Insufficient documentation

## 2016-09-28 DIAGNOSIS — F17210 Nicotine dependence, cigarettes, uncomplicated: Secondary | ICD-10-CM | POA: Insufficient documentation

## 2016-09-28 DIAGNOSIS — M25512 Pain in left shoulder: Secondary | ICD-10-CM | POA: Insufficient documentation

## 2016-09-28 MED ORDER — KETOROLAC TROMETHAMINE 60 MG/2ML IM SOLN
30.0000 mg | Freq: Once | INTRAMUSCULAR | Status: AC
  Administered 2016-09-28: 30 mg via INTRAMUSCULAR
  Filled 2016-09-28: qty 2

## 2016-09-28 MED ORDER — METHOCARBAMOL 500 MG PO TABS: 500.0000 mg | ORAL_TABLET | Freq: Four times a day (QID) | ORAL | 0 refills | Status: DC | PRN

## 2016-09-28 MED ORDER — MELOXICAM 7.5 MG PO TABS: 7.5000 mg | ORAL_TABLET | Freq: Every day | ORAL | 0 refills | Status: DC

## 2016-09-28 MED FILL — METHOCARBAMOL 500 MG TABLET: 500 | 2 days supply | Qty: 20 | Fill #0

## 2016-09-28 MED FILL — MELOXICAM 7.5 MG TABLET: 7.5 | 15 days supply | Qty: 15 | Fill #0

## 2016-09-28 NOTE — ED Notes (Signed)
Bed: WTR6 Expected date:  Expected time:  Means of arrival:  Comments: 

## 2016-09-28 NOTE — Discharge Instructions (Signed)
Please read all the instructions below. Discontinue taking Ibuprofen (Advil) and Cyclobenzaprine (Flexeril). Discuss your new medications with your pharmacists. Do not take any additional pain medications other than what is prescribed. Return for new fevers or new weakness or numbness in your arm.
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
## 2016-10-04 ENCOUNTER — Ambulatory Visit (INDEPENDENT_AMBULATORY_CARE_PROVIDER_SITE_OTHER): Payer: Self-pay | Admitting: Orthopaedic Surgery

## 2016-10-04 ENCOUNTER — Encounter (INDEPENDENT_AMBULATORY_CARE_PROVIDER_SITE_OTHER): Payer: Self-pay | Admitting: Orthopaedic Surgery

## 2016-10-04 DIAGNOSIS — G8929 Other chronic pain: Secondary | ICD-10-CM

## 2016-10-04 DIAGNOSIS — M25512 Pain in left shoulder: Secondary | ICD-10-CM

## 2016-10-04 NOTE — Progress Notes (Signed)
   Office Visit Note   Patient: [name]           Date of Birth: 15-May-1976           MRN: [id number] Visit Date: 10/04/2016              Requested by: Massie MaroonLachina M Hollis, FNP [address] [place], [place] [address] PCP: Massie MaroonHollis,Lachina M, FNP   Assessment & Plan: Visit Diagnoses:  1. Chronic left shoulder pain     Plan: I instructed the patient to obtain the MRI and the [facility] for review. I will see her back to review the study.  Follow-Up Instructions: Return if symptoms worsen or fail to improve.   Orders:  No orders of the defined types were placed in this encounter.  No orders of the defined types were placed in this encounter.     Procedures: No procedures performed   Clinical Data: No additional findings.   Subjective: Chief Complaint  Patient presents with  . Left Shoulder - Pain    Patient comes back today for review her MRI of her shoulder. [facility] but she does not have the MRI or the report with her.    Review of Systems   Objective: Vital Signs: There were no vitals taken for this visit.  Physical Exam  Ortho Exam Exam of the left shoulder is stable. Specialty Comments:  No specialty comments available.  Imaging: No results found.   PMFS History: Patient Active Problem List   Diagnosis Date Noted  . Injury of left shoulder 08/04/2016  . Muscle spasm of left shoulder 12/31/2015  . Left shoulder pain 12/31/2015  . Tobacco dependence 12/31/2015   Past Medical History:  Diagnosis Date  . Left shoulder pain     Family History  Problem Relation Age of Onset  . Cancer Father     Past Surgical History:  Procedure Laterality Date  . CESAREAN SECTION    . TUBAL LIGATION     Social History   Occupational History  . Not on file.   Social History Main Topics  . Smoking status: Current Every Day Smoker    Packs/day: 0.50    Years: 15.00   Types: Cigarettes  . Smokeless tobacco: Never Used     Comment: down to a half pack  . Alcohol use No  . Drug use: No  . Sexual activity: Not on file

## 2016-10-11 ENCOUNTER — Ambulatory Visit (INDEPENDENT_AMBULATORY_CARE_PROVIDER_SITE_OTHER): Payer: Self-pay | Admitting: Orthopaedic Surgery

## 2016-10-11 DIAGNOSIS — M25512 Pain in left shoulder: Secondary | ICD-10-CM

## 2016-10-11 DIAGNOSIS — G8929 Other chronic pain: Secondary | ICD-10-CM

## 2016-10-11 MED ORDER — LIDOCAINE HCL 1 % IJ SOLN
3.0000 mL | INTRAMUSCULAR | Status: AC | PRN
  Administered 2016-10-11: 3 mL

## 2016-10-11 MED ORDER — BUPIVACAINE HCL 0.5 % IJ SOLN
3.0000 mL | INTRAMUSCULAR | Status: AC | PRN
  Administered 2016-10-11: 3 mL via INTRA_ARTICULAR

## 2016-10-11 MED ORDER — METHYLPREDNISOLONE ACETATE 40 MG/ML IJ SUSP
40.0000 mg | INTRAMUSCULAR | Status: AC | PRN
  Administered 2016-10-11: 40 mg via INTRA_ARTICULAR

## 2016-10-11 MED FILL — IBUPROFEN 800 MG TABLET: 800 | 7 days supply | Qty: 21 | Fill #0

## 2016-10-11 NOTE — Progress Notes (Signed)
   Office Visit Note   Patient: [name]           Date of Birth: March 18, 1976           MRN: [id number] Visit Date: 10/11/2016              Requested by: Massie MaroonLachina M Hollis, FNP [address] [place], [place] [address] PCP: Massie MaroonHollis,Lachina M, FNP   Assessment & Plan: Visit Diagnoses:  1. Chronic left shoulder pain     Plan: My impression is that she has subacromial bursitis and musculature scapular trigger points. I recommend physical therapy with modalities and dry kneeling. Subacromial injection was performed today without any immediate competitions. Follow-up with me as needed.  Follow-Up Instructions: Return if symptoms worsen or fail to improve.   Orders:  Orders Placed This Encounter  Procedures  . Ambulatory referral to Physical Therapy   No orders of the defined types were placed in this encounter.     Procedures: Large Joint Inj Date/Time: 10/11/2016 12:20 PM Performed by: Tarry KosXU, Tarena Gockley M Authorized by: Tarry KosXU, Kimiko Common M   Consent Given by:  Patient Timeout: prior to procedure the correct patient, procedure, and site was verified   Location:  Shoulder Site:  L subacromial bursa Prep: patient was prepped and draped in usual sterile fashion   Needle Size:  22 G Approach:  Posterior Ultrasound Guidance: No   Fluoroscopic Guidance: No   Arthrogram: No   Medications:  3 mL lidocaine 1 %; 3 mL bupivacaine 0.5 %; 40 mg methylPREDNISolone acetate 40 MG/ML     Clinical Data: No additional findings.   Subjective: No chief complaint on file.   Patient comes back today to follow-up for her left shoulder pain. She is also complaining of significant medial scapular pain in the rhomboids. She does have her MRI report available for [facility].    Review of Systems   Objective: Vital Signs: There were no vitals taken for this visit.  Physical Exam  Ortho Exam Left shoulder exam shows no skin changes or swelling. She has  mild pain with rotation of the shoulder. Rotator cuff testing is grossly normal. She has significant tenderness to palpation in the rhomboid muscles. She does have positive impingement signs. Specialty Comments:  No specialty comments available.  Imaging: No results found.   PMFS History: Patient Active Problem List   Diagnosis Date Noted  . Injury of left shoulder 08/04/2016  . Muscle spasm of left shoulder 12/31/2015  . Left shoulder pain 12/31/2015  . Tobacco dependence 12/31/2015   Past Medical History:  Diagnosis Date  . Left shoulder pain     Family History  Problem Relation Age of Onset  . Cancer Father     Past Surgical History:  Procedure Laterality Date  . CESAREAN SECTION    . TUBAL LIGATION     Social History   Occupational History  . Not on file.   Social History Main Topics  . Smoking status: Current Every Day Smoker    Packs/day: 0.50    Years: 15.00    Types: Cigarettes  . Smokeless tobacco: Never Used     Comment: down to a half pack  . Alcohol use No  . Drug use: No  . Sexual activity: Not on file

## 2016-10-19 ENCOUNTER — Ambulatory Visit: Payer: No Typology Code available for payment source | Admitting: Physical Therapy

## 2016-10-19 ENCOUNTER — Encounter: Payer: Self-pay | Admitting: Physical Therapy

## 2016-10-19 DIAGNOSIS — M6281 Muscle weakness (generalized): Secondary | ICD-10-CM | POA: Diagnosis present

## 2016-10-19 DIAGNOSIS — M25612 Stiffness of left shoulder, not elsewhere classified: Secondary | ICD-10-CM | POA: Diagnosis present

## 2016-10-19 DIAGNOSIS — M7552 Bursitis of left shoulder: Secondary | ICD-10-CM | POA: Diagnosis not present

## 2016-10-19 DIAGNOSIS — M25512 Pain in left shoulder: Secondary | ICD-10-CM

## 2016-10-19 DIAGNOSIS — G8929 Other chronic pain: Secondary | ICD-10-CM | POA: Diagnosis present

## 2016-10-19 DIAGNOSIS — R252 Cramp and spasm: Secondary | ICD-10-CM

## 2016-10-19 DIAGNOSIS — M6790 Unspecified disorder of synovium and tendon, unspecified site: Secondary | ICD-10-CM | POA: Diagnosis present

## 2016-10-19 DIAGNOSIS — M6780 Other specified disorders of synovium and tendon, unspecified site: Secondary | ICD-10-CM
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
## 2016-10-25 ENCOUNTER — Ambulatory Visit: Payer: No Typology Code available for payment source | Admitting: Physical Therapy

## 2016-10-28 ENCOUNTER — Ambulatory Visit: Payer: No Typology Code available for payment source | Admitting: Physical Therapy

## 2016-11-01 ENCOUNTER — Encounter: Payer: Self-pay | Admitting: Physical Therapy

## 2016-11-01 ENCOUNTER — Ambulatory Visit: Payer: No Typology Code available for payment source | Attending: Orthopaedic Surgery | Admitting: Physical Therapy

## 2016-11-01 ENCOUNTER — Other Ambulatory Visit: Payer: Self-pay | Admitting: Family Medicine

## 2016-11-01 DIAGNOSIS — R252 Cramp and spasm: Secondary | ICD-10-CM | POA: Diagnosis present

## 2016-11-01 DIAGNOSIS — M6790 Unspecified disorder of synovium and tendon, unspecified site: Secondary | ICD-10-CM | POA: Insufficient documentation

## 2016-11-01 DIAGNOSIS — M25612 Stiffness of left shoulder, not elsewhere classified: Secondary | ICD-10-CM | POA: Insufficient documentation

## 2016-11-01 DIAGNOSIS — G8929 Other chronic pain: Secondary | ICD-10-CM

## 2016-11-01 DIAGNOSIS — M7552 Bursitis of left shoulder: Secondary | ICD-10-CM | POA: Diagnosis present

## 2016-11-01 DIAGNOSIS — M25512 Pain in left shoulder: Secondary | ICD-10-CM | POA: Diagnosis present

## 2016-11-01 DIAGNOSIS — M6281 Muscle weakness (generalized): Secondary | ICD-10-CM | POA: Diagnosis present

## 2016-11-01 DIAGNOSIS — M6780 Other specified disorders of synovium and tendon, unspecified site: Secondary | ICD-10-CM

## 2016-11-01 MED FILL — METHOCARBAMOL 500 MG TABLET: 500 | 10 days supply | Qty: 30 | Fill #1

## 2016-11-01 NOTE — Therapy (Addendum)
[facility] Outpatient Rehabilitation [facility] [address] [place], [place], [address] Phone: [phone]   Fax:  [phone]  Physical Therapy Treatment  Patient Details  Name: [name] MRN: [id number] Date of Birth: 1975/11/16 Referring Provider: Dr. Gershon Mussel  Encounter Date: 11/01/2016      PT End of Session - 11/01/16 1104    Visit Number 2   Number of Visits 16   Date for PT Re-Evaluation 12/14/16   PT Start Time 1024  pt arrived 9 minutes late   PT Stop Time 1112   PT Time Calculation (min) 48 min   Activity Tolerance Patient tolerated treatment well   Behavior During Therapy [facility] for tasks assessed/performed      Past Medical History:  Diagnosis Date  . Left shoulder pain     Past Surgical History:  Procedure Laterality Date  . CESAREAN SECTION    . TUBAL LIGATION      There were no vitals filed for this visit.      Subjective Assessment - 11/01/16 1024    Subjective "things are about the same, with increased soreness in the neck"   Currently in Pain? Yes   Pain Score 10-Worst pain ever   Pain Orientation Left   Pain Descriptors / Indicators Sharp;Tightness   Pain Type Chronic pain   Pain Onset More than a month ago   Pain Frequency Constant   Aggravating Factors  laying down,    Pain Relieving Factors meds            [facility] PT Assessment - 11/01/16 1030      Special Tests    Special Tests Cervical   Cervical Tests Spurling's     Spurling's   Findings Negative   Comment soreness on in the upper trap with no referral down the arm.                     [facility] Adult PT Treatment/Exercise - 11/01/16 1059      Neck Exercises: Seated   Neck Retraction 10 reps;5 secs  VC for form and rationale   Other Seated Exercise thoracic extension 2 x 10 over back of chari   given as HEP     Moist Heat Therapy   Number Minutes Moist Heat 10 Minutes   Moist Heat Location Shoulder;Cervical     Neck Exercises:  Stretches   Upper Trapezius Stretch 2 reps;30 seconds   Other Neck Stretches rhomboid stretch 2 x 30 sec          Trigger Point Dry Needling - 11/01/16 1103    Consent Given? Yes   Education Handout Provided Yes   Muscles Treated Upper Body Upper trapezius   Upper Trapezius Response Palpable increased muscle length;Twitch reponse elicited  L               PT Education - 11/01/16 1103    Education provided Yes   Education Details muscle anatomy and referral of pain. what TPDN is, benefits, and after care. sub-occipital release techniques using tennis balls.   Person(s) Educated Patient   Methods Explanation;Verbal cues;Handout   Comprehension Verbalized understanding;Verbal cues required          PT Short Term Goals - 10/19/16 1248      PT SHORT TERM GOAL #1   Title She will be independent with inital HEP   Time 4   Period Weeks   Status New     PT SHORT TERM GOAL #2  Title Pt will report pain decreased 30% or more in L UE, scapula for improved use of LUE for ADLs, rest.    Time 4   Period Weeks   Status New     PT SHORT TERM GOAL #3   Title Pt will report will demo AAROM in L UE to [facility] despite pain for normal movement patterns    Time 4   Period Weeks   Status New           PT Long Term Goals - 10/19/16 1250      PT LONG TERM GOAL #1   Title She will be independent with all HEP issued   Time 8   Status New     PT LONG TERM GOAL #2   Title Pt will report decreased Lt shoulder pain 50 % to allow use of LT arm for self care    Time 8   Period Weeks   Status New     PT LONG TERM GOAL #3   Title Patient will demo strength in LUE flexion and abduction to 4/5 or more.    Time 8   Period Weeks   Status New     PT LONG TERM GOAL #4   Title Pt will be able to lift, carry mod sized items in her home without increased pain in neck and L UE    Time 8   Period Weeks   Status New     PT LONG TERM GOAL #5   Title FOTO score will improve to <40%  limited    Time 8   Period Weeks   Status New               Plan - 11/01/16 1110    Clinical Impression Statement pt arrived 9 minutes late today reporting 10/10 pain today which she denied a trip to the ED and exhibited no visual signs or indications of 10/10 pain. TPDN was explained and performed onthe L upper trap followed with STM. performed thoracic mobility exercies and focused on stretching which she reported decreased pain. utilized MHP post session to calm down soreness/ tightness.  she reported some sorness but no HA following treatment.    PT Next Visit Plan assess resone ti TPDN., Modalities and manual for ROM and decr pain.    PT Home Exercise Plan thoracic extension,upper trap stretching, rhomboid stretching,    Consulted and Agree with Plan of Care Patient      Patient will benefit from skilled therapeutic intervention in order to improve the following deficits and impairments:  Decreased range of motion, Impaired UE functional use, Pain, Decreased activity tolerance, Decreased strength, Postural dysfunction, Increased muscle spasms, Decreased mobility, Increased fascial restricitons  Visit Diagnosis: Tendinosis  Chronic left shoulder pain  Muscle weakness (generalized)  Cramp and spasm       Problem List Patient Active Problem List   Diagnosis Date Noted  . Injury of left shoulder 08/04/2016  . Muscle spasm of left shoulder 12/31/2015  . Left shoulder pain 12/31/2015  . Tobacco dependence 12/31/2015   Lulu Riding PT, DPT, LAT, ATC  11/01/16  1:08 PM      [facility] Outpatient Rehabilitation [facility] [address] [place], [place], [address] Phone: [phone]   Fax:  [phone]  Name: [name] MRN: [id number] Date of Birth: 1976/02/07

## 2016-11-02 MED FILL — IBUPROFEN 800 MG TABLET: 800 | 7 days supply | Qty: 21 | Fill #0

## 2016-11-04 ENCOUNTER — Ambulatory Visit: Payer: No Typology Code available for payment source | Admitting: Physical Therapy

## 2016-11-04 ENCOUNTER — Encounter: Payer: Self-pay | Admitting: Physical Therapy

## 2016-11-04 DIAGNOSIS — M25512 Pain in left shoulder: Secondary | ICD-10-CM

## 2016-11-04 DIAGNOSIS — M6780 Other specified disorders of synovium and tendon, unspecified site: Secondary | ICD-10-CM

## 2016-11-04 DIAGNOSIS — M6281 Muscle weakness (generalized): Secondary | ICD-10-CM

## 2016-11-04 DIAGNOSIS — R252 Cramp and spasm: Secondary | ICD-10-CM

## 2016-11-04 DIAGNOSIS — M6790 Unspecified disorder of synovium and tendon, unspecified site: Secondary | ICD-10-CM | POA: Diagnosis not present

## 2016-11-04 DIAGNOSIS — G8929 Other chronic pain: Secondary | ICD-10-CM

## 2016-11-04 NOTE — Therapy (Signed)
[facility] Outpatient Rehabilitation [facility] [address] [place], [place], [address] Phone: [phone]   Fax:  [phone]  Physical Therapy Treatment  Patient Details  Name: [name] MRN: [id number] Date of Birth: 1976-03-13 Referring Provider: Dr. Gershon Mussel  Encounter Date: 11/04/2016      PT End of Session - 11/04/16 1045    Visit Number 3   Number of Visits 16   Date for PT Re-Evaluation 12/14/16   Authorization Type self pay   PT Start Time 1025  pt arrived 10 minutes late   PT Stop Time 1056  reported she had to leave early   PT Time Calculation (min) 31 min   Activity Tolerance Patient tolerated treatment well   Behavior During Therapy [facility] for tasks assessed/performed      Past Medical History:  Diagnosis Date  . Left shoulder pain     Past Surgical History:  Procedure Laterality Date  . CESAREAN SECTION    . TUBAL LIGATION      There were no vitals filed for this visit.      Subjective Assessment - 11/04/16 1025    Subjective "I am feeling, TPDN helped but it returned. I didn't do any exericse except laying down"    Currently in Pain? Yes   Pain Score 10-Worst pain ever   Pain Descriptors / Indicators Sharp;Tightness   Pain Type Chronic pain   Pain Onset More than a month ago   Pain Frequency Constant   Aggravating Factors  sitting up straight   Pain Relieving Factors meds                         [facility] Adult PT Treatment/Exercise - 11/04/16 1045      Moist Heat Therapy   Number Minutes Moist Heat 10 Minutes   Moist Heat Location Shoulder;Cervical     Electrical Stimulation   Electrical Stimulation Location L upper trap   Electrical Stimulation Action IFC   Electrical Stimulation Parameters 100% scan, L6 x 8 min   Electrical Stimulation Goals Pain     Manual Therapy   Manual Therapy Soft tissue mobilization;Myofascial release   Manual therapy comments manual trigger point release over bil upper  trap and levator scapulae  instructor pt's daughter how to perform   Soft tissue mobilization soft tissue work over L upper trap     Neck Exercises: Stretches   Upper Trapezius Stretch 2 reps;30 seconds   Other Neck Stretches rhomboid stretch 2 x 30 sec                PT Education - 11/04/16 1050    Education provided Yes   Education Details how to perform manual trigger point release, how long to hold and how often to perform   Person(s) Educated Patient;Child(ren)   Methods Explanation;Demonstration;Verbal cues   Comprehension Verbalized understanding;Returned demonstration;Verbal cues required          PT Short Term Goals - 10/19/16 1248      PT SHORT TERM GOAL #1   Title She will be independent with inital HEP   Time 4   Period Weeks   Status New     PT SHORT TERM GOAL #2   Title Pt will report pain decreased 30% or more in L UE, scapula for improved use of LUE for ADLs, rest.    Time 4   Period Weeks   Status New     PT SHORT TERM GOAL #  3   Title Pt will report will demo AAROM in L UE to [facility] despite pain for normal movement patterns    Time 4   Period Weeks   Status New           PT Long Term Goals - 10/19/16 1250      PT LONG TERM GOAL #1   Title She will be independent with all HEP issued   Time 8   Status New     PT LONG TERM GOAL #2   Title Pt will report decreased Lt shoulder pain 50 % to allow use of LT arm for self care    Time 8   Period Weeks   Status New     PT LONG TERM GOAL #3   Title Patient will demo strength in LUE flexion and abduction to 4/5 or more.    Time 8   Period Weeks   Status New     PT LONG TERM GOAL #4   Title Pt will be able to lift, carry mod sized items in her home without increased pain in neck and L UE    Time 8   Period Weeks   Status New     PT LONG TERM GOAL #5   Title FOTO score will improve to <40% limited    Time 8   Period Weeks   Status New               Plan - 11/04/16 1047     Clinical Impression Statement short visit today due to pt arriving 10 minutes late and stating she had to leave 10 min early. She continues to report 10/10 pain which she declined ED trip, demonstating stiffness but no other signs indications of pain. Based on limited time focused on manual techniques and educated pt's daughter how to perform manual trigger point release. utilized E-stim and MHP to calm down pain.    PT Next Visit Plan , Modalities and manual for ROM and decr pain. if no changes in pain in the next few visits may need to refer back to MD   Consulted and Agree with Plan of Care Patient      Patient will benefit from skilled therapeutic intervention in order to improve the following deficits and impairments:  Decreased range of motion, Impaired UE functional use, Pain, Decreased activity tolerance, Decreased strength, Postural dysfunction, Increased muscle spasms, Decreased mobility, Increased fascial restricitons  Visit Diagnosis: Tendinosis  Chronic left shoulder pain  Muscle weakness (generalized)  Cramp and spasm     Problem List Patient Active Problem List   Diagnosis Date Noted  . Injury of left shoulder 08/04/2016  . Muscle spasm of left shoulder 12/31/2015  . Left shoulder pain 12/31/2015  . Tobacco dependence 12/31/2015   Lulu Riding PT, DPT, LAT, ATC  11/04/16  10:51 AM      [facility] [address] [place], [place], [address] Phone: [phone]   Fax:  [phone]  Name: [name] MRN: [id number] Date of Birth: [birth year]

## 2016-11-08 ENCOUNTER — Ambulatory Visit (HOSPITAL_COMMUNITY)
Admission: EM | Admit: 2016-11-08 | Discharge: 2016-11-08 | Disposition: A | Discharge status: Home / Self Care | Payer: No Typology Code available for payment source | Attending: Internal Medicine | Admitting: Internal Medicine

## 2016-11-08 ENCOUNTER — Ambulatory Visit: Payer: No Typology Code available for payment source | Admitting: Physical Therapy

## 2016-11-08 ENCOUNTER — Encounter: Payer: Self-pay | Admitting: Physical Therapy

## 2016-11-08 ENCOUNTER — Encounter (HOSPITAL_COMMUNITY): Payer: Self-pay | Admitting: Emergency Medicine

## 2016-11-08 DIAGNOSIS — M6790 Unspecified disorder of synovium and tendon, unspecified site: Secondary | ICD-10-CM | POA: Diagnosis not present

## 2016-11-08 DIAGNOSIS — G8929 Other chronic pain: Secondary | ICD-10-CM

## 2016-11-08 DIAGNOSIS — R252 Cramp and spasm: Secondary | ICD-10-CM

## 2016-11-08 DIAGNOSIS — M25512 Pain in left shoulder: Secondary | ICD-10-CM

## 2016-11-08 DIAGNOSIS — S46912S Strain of unspecified muscle, fascia and tendon at shoulder and upper arm level, left arm, sequela: Secondary | ICD-10-CM

## 2016-11-08 DIAGNOSIS — M6281 Muscle weakness (generalized): Secondary | ICD-10-CM

## 2016-11-08 DIAGNOSIS — M6780 Other specified disorders of synovium and tendon, unspecified site: Secondary | ICD-10-CM

## 2016-11-08 MED ORDER — METHOCARBAMOL 500 MG PO TABS: 500.0000 mg | ORAL_TABLET | Freq: Two times a day (BID) | ORAL | 0 refills | Status: DC

## 2016-11-08 MED ORDER — DICLOFENAC SODIUM 75 MG PO TBEC: 75.0000 mg | DELAYED_RELEASE_TABLET | Freq: Two times a day (BID) | ORAL | 0 refills | Status: DC

## 2016-11-08 MED FILL — ?DICLOFENAC SOD DR 75 MG TA: 75 | 10 days supply | Qty: 20 | Fill #0

## 2016-11-08 NOTE — ED Triage Notes (Addendum)
Patient reports having physical therapy on shoulder this morning.  Patient concerned that shoulder feels worse after therapy.  Left shoulder is the joint involved.  Patient is out of medications for 1-2 weeks.

## 2016-11-08 NOTE — ED Provider Notes (Signed)
CSN: [id number]     Arrival date & time 11/08/16  1223 History   First MD Initiated Contact with Patient 11/08/16 1251     Chief Complaint  Patient presents with  . Shoulder Pain   (Consider location/radiation/quality/duration/timing/severity/associated sxs/prior Treatment) 41 year old female presents to clinic for evaluation of left shoulder pain. This been ongoing for 1 year, his had previous MRI workup diagnosed with "tear" in her shoulder. She was at physical therapy today, stated that physical therapy be made it "worse"   The history is provided by the patient.  Shoulder Pain  Location:  Shoulder Shoulder location:  L shoulder Injury: no   Pain details:    Quality:  Aching, cramping, sharp and throbbing   Radiates to:  L shoulder   Severity:  Severe   Onset quality:  Gradual   Duration: 1 year.   Progression:  Worsening Handedness:  Right-handed Dislocation: no   Prior injury to area:  Yes Relieved by:  Physical therapy, muscle relaxant and NSAIDs Worsened by:  Movement Ineffective treatments:  Physical therapy Associated symptoms: back pain, stiffness and tingling   Associated symptoms: no decreased range of motion, no fever, no neck pain and no swelling     Past Medical History:  Diagnosis Date  . Left shoulder pain    Past Surgical History:  Procedure Laterality Date  . CESAREAN SECTION    . TUBAL LIGATION     Family History  Problem Relation Age of Onset  . Cancer Father    Social History  Substance Use Topics  . Smoking status: Current Every Day Smoker    Packs/day: 0.50    Years: 15.00    Types: Cigarettes  . Smokeless tobacco: Never Used     Comment: down to a half pack  . Alcohol use No   OB History    No data available     Review of Systems  Constitutional: Negative for chills and fever.  HENT: Negative.   Respiratory: Negative.   Cardiovascular: Negative.   Gastrointestinal: Negative.   Musculoskeletal: Positive for back pain and  stiffness. Negative for neck pain and neck stiffness.  Neurological: Negative.     Allergies  Patient has no known allergies.  Home Medications   Prior to Admission medications   Medication Sig Start Date End Date Taking? Authorizing Provider  gabapentin (NEURONTIN) 100 MG capsule TAKE 1 CAPSULE BY MOUTH 3 TIMES DAILY 09/15/16  Yes Massie Maroon, FNP  ibuprofen (ADVIL,MOTRIN) 800 MG tablet TAKE 1 TABLET BY MOUTH 3 TIMES DAILY 11/01/16  Yes Massie Maroon, FNP  meloxicam (MOBIC) 7.5 MG tablet Take 1 tablet (7.5 mg total) by mouth daily. 09/28/16  Yes [place] N Russo, PA-C  diclofenac (VOLTAREN) 75 MG EC tablet Take 1 tablet (75 mg total) by mouth 2 (two) times daily. 11/08/16   Dorena Bodo, NP  methocarbamol (ROBAXIN) 500 MG tablet Take 1 tablet (500 mg total) by mouth 2 (two) times daily. 11/08/16   Dorena Bodo, NP   Meds Ordered and Administered this Visit  Medications - No data to display  BP (!) 152/87 (BP Location: Right Arm) Comment: notified rn  Pulse 88   Temp 98 F (36.7 C) (Oral)   Resp 14   SpO2 100%  No data found.   Physical Exam  Constitutional: She is oriented to person, place, and time. She appears well-developed and well-nourished. No distress.  HENT:  Head: Normocephalic and atraumatic.  Right Ear: External ear normal.  Left Ear: External  ear normal.  Eyes: Conjunctivae are normal. Right eye exhibits no discharge. Left eye exhibits no discharge.  Cardiovascular: Normal rate and regular rhythm.   Pulmonary/Chest: Effort normal and breath sounds normal.  Musculoskeletal:       Left shoulder: She exhibits decreased range of motion, tenderness, pain and spasm.  Neurological: She is alert and oriented to person, place, and time.  Skin: Skin is warm and dry. Capillary refill takes less than 2 seconds. No rash noted. She is not diaphoretic. No erythema.  Psychiatric: She has a normal mood and affect. Her behavior is normal.  Nursing note and vitals  reviewed.   Urgent Care Course     Procedures (including critical care time)  Labs Review Labs Reviewed - No data to display  Imaging Review No results found.    MDM   1. Strain of left shoulder, sequela    Pt given Rx of Robaxin and diclofenac,  Encouraged to follow up with primary care or seek referral to pain management specialist.      Dorena Bodo, NP 11/08/16 1328

## 2016-11-08 NOTE — Therapy (Signed)
[facility] Outpatient Rehabilitation [facility] [address] [place], [place], [address] Phone: [phone]   Fax:  [phone]  Physical Therapy Treatment  Patient Details  Name: [name] MRN: [id number] Date of Birth: 07/14/1976 Referring Provider: Dr. Gershon Mussel  Encounter Date: 11/08/2016      PT End of Session - 11/08/16 1038    Visit Number 4   Number of Visits 16   Date for PT Re-Evaluation 12/14/16   PT Start Time 1018   PT Stop Time 1045   PT Time Calculation (min) 27 min   Activity Tolerance Patient tolerated treatment well   Behavior During Therapy [facility] for tasks assessed/performed      Past Medical History:  Diagnosis Date  . Left shoulder pain     Past Surgical History:  Procedure Laterality Date  . CESAREAN SECTION    . TUBAL LIGATION      There were no vitals filed for this visit.      Subjective Assessment - 11/08/16 1019    Subjective "I am having alot of pain, with increased pinching" I am having significant pain in the L shoulder pain.   Currently in Pain? Yes   Pain Score 10-Worst pain ever   Pain Location Neck   Pain Orientation Left   Pain Type Chronic pain            [facility] PT Assessment - 11/08/16 1037      Observation/Other Assessments   Focus on Therapeutic Outcomes (FOTO)  50% limited                     [facility] Adult PT Treatment/Exercise - 11/08/16 1037      Moist Heat Therapy   Number Minutes Moist Heat 15 Minutes   Moist Heat Location Shoulder  supine     Electrical Stimulation   Electrical Stimulation Location L thoracic paraspinals/ rhomboid region   Electrical Stimulation Action IFC   Electrical Stimulation Parameters 100% scan L 10 x 15 min   Electrical Stimulation Goals Pain                PT Education - 11/08/16 1047    Education provided Yes   Education Details discussed updated POC concurrent to E-stim/ MHP, that if no progress in function or relief of pain is  noted in the next few visits to refer back to her MD    Person(s) Educated Patient   Methods Explanation;Verbal cues   Comprehension Verbalized understanding;Verbal cues required          PT Short Term Goals - 11/08/16 1041      PT SHORT TERM GOAL #1   Title She will be independent with inital HEP   Time 4   Period Weeks   Status On-going     PT SHORT TERM GOAL #2   Title Pt will report pain decreased 30% or more in L UE, scapula for improved use of LUE for ADLs, rest.    Time 4   Period Weeks   Status On-going     PT SHORT TERM GOAL #3   Title Pt will report will demo AAROM in L UE to [facility] despite pain for normal movement patterns    Time 4   Period Weeks   Status On-going           PT Long Term Goals - 10/19/16 1250      PT LONG TERM GOAL #1   Title She will be independent  with all HEP issued   Time 8   Status New     PT LONG TERM GOAL #2   Title Pt will report decreased Lt shoulder pain 50 % to allow use of LT arm for self care    Time 8   Period Weeks   Status New     PT LONG TERM GOAL #3   Title Patient will demo strength in LUE flexion and abduction to 4/5 or more.    Time 8   Period Weeks   Status New     PT LONG TERM GOAL #4   Title Pt will be able to lift, carry mod sized items in her home without increased pain in neck and L UE    Time 8   Period Weeks   Status New     PT LONG TERM GOAL #5   Title FOTO score will improve to <40% limited    Time 8   Period Weeks   Status New               Plan - 11/08/16 1038    Clinical Impression Statement Pt arrived reporting 10/10 pain located around the medial aspect of the L shoulder blade that started this morning. due to elevated pain and significant guarding focused on pain relief via E-stim with MHP. pt reported continued elevated pain so opted to shorten todays visit so she can try and get in with her referring MD or Urgent care. discussed with pt that plan for 1-2 more visits and if there  is no change in pain or improvement in function then plan to discharge her and refer back to her MD for further assessment.    PT Next Visit Plan DN, did pt see her MD or urgent care Modalities and manual for ROM and decr pain. if no changes in pain in the next few visits may need to refer back to MD   Consulted and Agree with Plan of Care Patient      Patient will benefit from skilled therapeutic intervention in order to improve the following deficits and impairments:  Decreased range of motion, Impaired UE functional use, Pain, Decreased activity tolerance, Decreased strength, Postural dysfunction, Increased muscle spasms, Decreased mobility, Increased fascial restricitons  Visit Diagnosis: Tendinosis  Chronic left shoulder pain  Cramp and spasm  Muscle weakness (generalized)     Problem List Patient Active Problem List   Diagnosis Date Noted  . Injury of left shoulder 08/04/2016  . Muscle spasm of left shoulder 12/31/2015  . Left shoulder pain 12/31/2015  . Tobacco dependence 12/31/2015    Lulu Riding 11/08/2016, 10:50 AM  [facility] [address] [place], [place], [address] Phone: [phone]   Fax:  [phone]  Name: [name] MRN: [id number] Date of Birth: 1976-04-06

## 2016-11-08 NOTE — Discharge Instructions (Signed)
You most likely have a muscle spasm in your shoulder. I have prescribed two medicines for your pain. The first is diclofenac, take 1 tablet twice a day and the other is Robaxin, take 1 tablet twice a day. Robaxin may cause drowsiness so do not drive until you know how this medicine affects you. Also do not drink any alcohol either. You may apply ice and alternate with heat for 15 minutes at a time 4 times daily and for additional pain control you may take tylenol over the counter ever 4 hours but do not take more than 4000 mg a day. Should your pain continue or fail to resolve, follow up with your primary care provider or return to clinic as needed.

## 2016-11-11 ENCOUNTER — Ambulatory Visit: Payer: No Typology Code available for payment source | Admitting: Physical Therapy

## 2016-11-11 DIAGNOSIS — G8929 Other chronic pain: Secondary | ICD-10-CM

## 2016-11-11 DIAGNOSIS — M6281 Muscle weakness (generalized): Secondary | ICD-10-CM

## 2016-11-11 DIAGNOSIS — M7552 Bursitis of left shoulder: Secondary | ICD-10-CM

## 2016-11-11 DIAGNOSIS — M25612 Stiffness of left shoulder, not elsewhere classified: Secondary | ICD-10-CM

## 2016-11-11 DIAGNOSIS — M25512 Pain in left shoulder: Secondary | ICD-10-CM

## 2016-11-11 DIAGNOSIS — M6780 Other specified disorders of synovium and tendon, unspecified site: Secondary | ICD-10-CM

## 2016-11-11 DIAGNOSIS — R252 Cramp and spasm: Secondary | ICD-10-CM

## 2016-11-11 DIAGNOSIS — M6790 Unspecified disorder of synovium and tendon, unspecified site: Secondary | ICD-10-CM | POA: Diagnosis not present

## 2016-11-11 NOTE — Therapy (Signed)
[facility] Outpatient Rehabilitation [facility] [address] [place], [place], [address] Phone: [phone]   Fax:  [phone]  Physical Therapy Treatment  Patient Details  Name: [name] MRN: [id number] Date of Birth: July 05, 1976 Referring Provider: Dr. Gershon Mussel  Encounter Date: 11/11/2016      PT End of Session - 11/11/16 1112    Visit Number 5   Number of Visits 16   Date for PT Re-Evaluation 12/14/16   Authorization Type self pay   PT Start Time 1020   PT Stop Time 1115   PT Time Calculation (min) 55 min   Activity Tolerance Patient tolerated treatment well   Behavior During Therapy [name] for tasks assessed/performed      Past Medical History:  Diagnosis Date  . Left shoulder pain     Past Surgical History:  Procedure Laterality Date  . CESAREAN SECTION    . TUBAL LIGATION      There were no vitals filed for this visit.      Subjective Assessment - 11/11/16 1022    Subjective Pt saw MD and finally got better medications.  Has been doing HEP and heat.  The Ibuprofen contributed ot my headache.     Currently in Pain? Yes   Pain Score 9    Pain Location Scapula   Pain Orientation Left   Pain Descriptors / Indicators Tightness   Pain Type Chronic pain   Pain Onset More than a month ago   Pain Frequency Constant   Aggravating Factors  moving her arm    Pain Relieving Factors meds, heat   Multiple Pain Sites No            [facility] PT Assessment - 11/11/16 1044      AROM   Cervical Flexion 65   Cervical Extension 20             [facility] Adult PT Treatment/Exercise - 11/11/16 1031      Neck Exercises: Standing   Wall Wash as tolerated    Other Standing Exercises wall walks lateral for WB      Shoulder Exercises: Supine   Flexion AAROM;Both   Shoulder Flexion Weight (lbs) unable with UE Ranger     Shoulder Exercises: Standing   Protraction AROM;Both;10 reps   Other Standing Exercises wall push up x 10      Shoulder  Exercises: ROM/Strengthening   Cybex Press Limitations     Other ROM/Strengthening Exercises wall walks for WB      Shoulder Exercises: Stretch   Cross Chest Stretch 3 reps;30 seconds     Moist Heat Therapy   Number Minutes Moist Heat 15 Minutes   Moist Heat Location Shoulder  supine     Electrical Stimulation   Electrical Stimulation Location L periscapular area   Electrical Stimulation Action IFC   Electrical Stimulation Parameters to tol    Electrical Stimulation Goals Pain     Manual Therapy   Soft tissue mobilization soft tissue work over L upper trap, rhomboid , teres minor and infrapinatus, very painful but tolerated      Neck Exercises: Stretches   Upper Trapezius Stretch 2 reps;30 seconds   Levator Stretch 2 reps;30 seconds   Other Neck Stretches rhomboid stretch 2 x 30 sec                PT Education - 11/11/16 1112    Education provided Yes   Education Details rhomboid stretch    Person(s) Educated Patient  Methods Explanation;Demonstration   Comprehension Returned demonstration;Verbalized understanding          PT Short Term Goals - 11/08/16 1041      PT SHORT TERM GOAL #1   Title She will be independent with inital HEP   Time 4   Period Weeks   Status On-going     PT SHORT TERM GOAL #2   Title Pt will report pain decreased 30% or more in L UE, scapula for improved use of LUE for ADLs, rest.    Time 4   Period Weeks   Status On-going     PT SHORT TERM GOAL #3   Title Pt will report will demo AAROM in L UE to [facility] despite pain for normal movement patterns    Time 4   Period Weeks   Status On-going           PT Long Term Goals - 10/19/16 1250      PT LONG TERM GOAL #1   Title She will be independent with all HEP issued   Time 8   Status New     PT LONG TERM GOAL #2   Title Pt will report decreased Lt shoulder pain 50 % to allow use of LT arm for self care    Time 8   Period Weeks   Status New     PT LONG TERM GOAL #3    Title Patient will demo strength in LUE flexion and abduction to 4/5 or more.    Time 8   Period Weeks   Status New     PT LONG TERM GOAL #4   Title Pt will be able to lift, carry mod sized items in her home without increased pain in neck and L UE    Time 8   Period Weeks   Status New     PT LONG TERM GOAL #5   Title FOTO score will improve to <40% limited    Time 8   Period Weeks   Status New               Plan - 11/11/16 1026    Clinical Impression Statement Patient cont to have significant pain and muscle spasm.  She has new meds which will hopefully help her.  Focused on scapular mobility today, did not tolerate well but did feel less pain after modalities and manual.     PT Next Visit Plan DN, Modalities and manual for ROM and decr pain. if no changes in pain in the next few visits may need to refer back to MD   PT Home Exercise Plan thoracic extension,upper trap stretching, rhomboid stretching, cat/camel and scap protract on wall    Consulted and Agree with Plan of Care Patient      Patient will benefit from skilled therapeutic intervention in order to improve the following deficits and impairments:  Decreased range of motion, Impaired UE functional use, Pain, Decreased activity tolerance, Decreased strength, Postural dysfunction, Increased muscle spasms, Decreased mobility, Increased fascial restricitons  Visit Diagnosis: Tendinosis  Chronic left shoulder pain  Cramp and spasm  Muscle weakness (generalized)  Chronic bursitis of left shoulder  Stiffness of left shoulder, not elsewhere classified     Problem List Patient Active Problem List   Diagnosis Date Noted  . Injury of left shoulder 08/04/2016  . Muscle spasm of left shoulder 12/31/2015  . Left shoulder pain 12/31/2015  . Tobacco dependence 12/31/2015    Jilian West 11/11/2016, 11:19 AM  [facility] Outpatient Rehabilitation [facility] [address] [place], [place],  [address] Phone: [phone]   Fax:  [phone]  Name: [name] MRN: [id number] Date of Birth: January 03, 1976  Karie Mainland, PT 11/11/16 11:19 AM Phone: [phone] Fax: [phone]

## 2016-11-11 NOTE — Patient Instructions (Signed)
Angry Cat Stretch    Tuck chin and tighten stomach Repeat ___10_ times per set. Do __1__ sets per session. Do _2___ sessions per day.  http://orth.exer.us/119   Copyright  VHI. All rights reserved.  You can also do this against the wall.

## 2016-11-15 ENCOUNTER — Ambulatory Visit: Payer: No Typology Code available for payment source | Admitting: Physical Therapy

## 2016-11-18 ENCOUNTER — Ambulatory Visit: Payer: No Typology Code available for payment source | Admitting: Physical Therapy

## 2016-11-18 DIAGNOSIS — R252 Cramp and spasm: Secondary | ICD-10-CM

## 2016-11-18 DIAGNOSIS — M6281 Muscle weakness (generalized): Secondary | ICD-10-CM

## 2016-11-18 DIAGNOSIS — G8929 Other chronic pain: Secondary | ICD-10-CM

## 2016-11-18 DIAGNOSIS — M6780 Other specified disorders of synovium and tendon, unspecified site: Secondary | ICD-10-CM

## 2016-11-18 DIAGNOSIS — M25512 Pain in left shoulder: Secondary | ICD-10-CM

## 2016-11-18 DIAGNOSIS — M6790 Unspecified disorder of synovium and tendon, unspecified site: Secondary | ICD-10-CM | POA: Diagnosis not present

## 2016-11-18 NOTE — Therapy (Addendum)
[place], [place], [address] Phone: [phone]   Fax:  [phone]  Physical Therapy Treatment  Patient Details  Name: [name] MRN: [id number] Date of Birth: [birth year] Referring Provider: Dr. Frankey Shown  Encounter Date: 11/18/2016      PT End of Session - 11/18/16 1057    Visit Number 6   Number of Visits 16   Date for PT Re-Evaluation 12/14/16   PT Start Time 1017   PT Stop Time 1112   PT Time Calculation (min) 55 min   Activity Tolerance Patient tolerated treatment well   Behavior During Therapy [facility] for tasks assessed/performed      Past Medical History:  Diagnosis Date  . Left shoulder pain     Past Surgical History:  Procedure Laterality Date  . CESAREAN SECTION    . TUBAL LIGATION      There were no vitals filed for this visit.      Subjective Assessment - 11/18/16 1021    Subjective Its better, its an 8/10 in shoulder blade. The electric stim helped.    Currently in Pain? Yes   Pain Score 8    Pain Location Scapula   Pain Orientation Left   Pain Descriptors / Indicators Tightness;Burning;Sharp;Shooting;Sore   Pain Type Chronic pain   Pain Onset More than a month ago   Pain Frequency Constant   Aggravating Factors  using her arm   Pain Relieving Factors meds, heat, IFC                          [facility] Adult PT Treatment/Exercise - 11/18/16 1030      Shoulder Exercises: Supine   External Rotation AAROM;Left;10 reps   Theraband Level (Shoulder External Rotation) --  cane, added trunk rotation and head turns    Flexion AAROM;Both;10 reps   Shoulder Flexion Weight (lbs) cane    Other Supine Exercises trunk rotation in supine x 10      Shoulder Exercises: Sidelying   Flexion AAROM;Strengthening;Left;10 reps   ABduction AAROM;Strengthening;Left;10 reps   Other Sidelying Exercises upper trunk rotation x 5      Electrical Stimulation   Electrical  Stimulation Location L periscapular, neck  area   Electrical Stimulation Action IFC   Electrical Stimulation Parameters to tol    Electrical Stimulation Goals Pain     Manual Therapy   Manual Therapy Passive ROM   Soft tissue mobilization Lt upper traps, levator scap , trigger point release   Passive ROM L UE and neck      Neck Exercises: Stretches   Upper Trapezius Stretch 2 reps;30 seconds   Levator Stretch 2 reps;30 seconds                PT Education - 11/18/16 1057    Education provided Yes   Education Details neck involvement, trigger points    Person(s) Educated Patient   Methods Explanation;Demonstration   Comprehension Verbalized understanding          PT Short Term Goals - 11/18/16 1059      PT SHORT TERM GOAL #1   Title She will be independent with inital HEP   Status Achieved     PT SHORT TERM GOAL #2   Title Pt will report pain decreased 30% or more in L UE, scapula for improved use of LUE for ADLs, rest.    Status On-going     PT SHORT TERM  GOAL #3   Title Pt will report will demo AAROM in L UE to [facility] despite pain for normal movement patterns    Status On-going           PT Long Term Goals - 11/18/16 1100      PT LONG TERM GOAL #1   Title She will be independent with all HEP issued   Status On-going     PT LONG TERM GOAL #2   Title Pt will report decreased Lt shoulder pain 50 % to allow use of LT arm for self care    Status On-going     PT LONG TERM GOAL #3   Title Patient will demo strength in LUE flexion and abduction to 4/5 or more.    Status On-going     PT LONG TERM GOAL #4   Title Pt will be able to lift, carry mod sized items in her home without increased pain in neck and L UE    Status On-going     PT LONG TERM GOAL #5   Title FOTO score will improve to <40% limited    Status On-going               Plan - 11/18/16 1057    Clinical Impression Statement Pt cont to have pain and spasms in L neck and scapula which  limit her use of her L side.  She is highly guarded, does not turn her head or trunk for interaction. Did respond well to IFC .  Rec TPDN fro next several visits.    PT Next Visit Plan DN, Modalities and manual for ROM and decr pain. if no changes in pain in the next few visits may need to refer back to MD   PT Home Exercise Plan thoracic extension,upper trap stretching, rhomboid stretching, cat/camel and scap protract on wall    Consulted and Agree with Plan of Care Patient      Patient will benefit from skilled therapeutic intervention in order to improve the following deficits and impairments:  Decreased range of motion, Impaired UE functional use, Pain, Decreased activity tolerance, Decreased strength, Postural dysfunction, Increased muscle spasms, Decreased mobility, Increased fascial restricitons  Visit Diagnosis: Tendinosis  Chronic left shoulder pain  Cramp and spasm  Muscle weakness (generalized)     Problem List Patient Active Problem List   Diagnosis Date Noted  . Injury of left shoulder 08/04/2016  . Muscle spasm of left shoulder 12/31/2015  . Left shoulder pain 12/31/2015  . Tobacco dependence 12/31/2015    PAA,JENNIFER 11/18/2016, 11:45 AM  [facility] [address] [place], [place], [address] Phone: [phone]   Fax:  [phone]  Name: [name] MRN: [id number] Date of Birth: [birth year]   Raeford Razor, PT 11/18/16 11:46 AM Phone: [phone] Fax: [phone]  PHYSICAL THERAPY DISCHARGE SUMMARY  Visits from Start of Care: 6  Current functional level related to goals / functional outcomes: See above for most recent info    Remaining deficits: Unknown did not return since last visit   Education / Equipment: HEP, posture, body mechanics  Plan: Patient agrees to discharge.  Patient goals were not met. Patient is being discharged due to not returning since the last visit.  ?????     Raeford Razor, PT 01/03/17 10:49 AM Phone: [phone] Fax: [phone]

## 2016-11-21 ENCOUNTER — Ambulatory Visit (INDEPENDENT_AMBULATORY_CARE_PROVIDER_SITE_OTHER): Payer: No Typology Code available for payment source | Admitting: Family Medicine

## 2016-11-21 ENCOUNTER — Encounter: Payer: Self-pay | Admitting: Family Medicine

## 2016-11-21 VITALS — BP 168/92 | HR 77 | Temp 98.7°F | Resp 14 | Ht 60.0 in | Wt 113.0 lb

## 2016-11-21 DIAGNOSIS — G8929 Other chronic pain: Secondary | ICD-10-CM

## 2016-11-21 DIAGNOSIS — F17200 Nicotine dependence, unspecified, uncomplicated: Secondary | ICD-10-CM

## 2016-11-21 DIAGNOSIS — M62838 Other muscle spasm: Secondary | ICD-10-CM

## 2016-11-21 DIAGNOSIS — M25512 Pain in left shoulder: Secondary | ICD-10-CM

## 2016-11-21 DIAGNOSIS — I10 Essential (primary) hypertension: Secondary | ICD-10-CM

## 2016-11-21 LAB — TSH: TSH: 1.76 mIU/L

## 2016-11-21 LAB — POCT URINALYSIS DIP (DEVICE)
BILIRUBIN URINE: NEGATIVE
Glucose, UA: NEGATIVE mg/dL
KETONES UR: NEGATIVE mg/dL
LEUKOCYTES UA: NEGATIVE
NITRITE: NEGATIVE
Protein, ur: NEGATIVE mg/dL
Specific Gravity, Urine: 1.02 (ref 1.005–1.030)
Urobilinogen, UA: 0.2 mg/dL (ref 0.0–1.0)
pH: 7 (ref 5.0–8.0)

## 2016-11-21 LAB — BASIC METABOLIC PANEL WITH GFR
BUN: 10 mg/dL (ref 7–25)
CALCIUM: 8.8 mg/dL (ref 8.6–10.2)
CO2: 23 mmol/L (ref 20–31)
CREATININE: 0.88 mg/dL (ref 0.50–1.10)
Chloride: 108 mmol/L (ref 98–110)
GFR, Est African American: >89 mL/min (ref >=60)
GFR, Est Non African American: 82 mL/min (ref >=60)
GLUCOSE: 85 mg/dL (ref 65–99)
Potassium: 3.9 mmol/L (ref 3.5–5.3)
SODIUM: 140 mmol/L (ref 135–146)

## 2016-11-21 MED ORDER — AMLODIPINE BESYLATE 5 MG PO TABS: 5.0000 mg | ORAL_TABLET | Freq: Every day | ORAL | 5 refills | Status: DC

## 2016-11-21 MED ORDER — BLOOD PRESSURE MONITOR/S CUFF MISC
1.0000 | Freq: Every day | 0 refills | Status: DC
Start: 2016-11-21 — End: 2017-03-24

## 2016-11-21 MED ORDER — DICLOFENAC SODIUM 75 MG PO TBEC: 75.0000 mg | DELAYED_RELEASE_TABLET | Freq: Two times a day (BID) | ORAL | 0 refills | Status: DC

## 2016-11-21 MED ORDER — METHOCARBAMOL 500 MG PO TABS: 500.0000 mg | ORAL_TABLET | Freq: Two times a day (BID) | ORAL | 1 refills | Status: DC

## 2016-11-21 MED FILL — ?DICLOFENAC SOD DR 75 MG TA: 75 | 15 days supply | Qty: 30 | Fill #0

## 2016-11-21 MED FILL — METHOCARBAMOL 500 MG TABLET: 500 | 30 days supply | Qty: 60 | Fill #0

## 2016-11-21 MED FILL — ?AMLODIPINE BESYLATE 5 MG T: 5 | 30 days supply | Qty: 30 | Fill #0
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
## 2016-11-21 NOTE — Patient Instructions (Addendum)
Amlodipine 5 mg daily.  The patient is asked to make an attempt to improve diet and exercise patterns to aid in medical management of this problem.    DASH Eating Plan DASH stands for "Dietary Approaches to Stop Hypertension." The DASH eating plan is a healthy eating plan that has been shown to reduce high blood pressure (hypertension). It may also reduce your risk for type 2 diabetes, heart disease, and stroke. The DASH eating plan may also help with weight loss. What are tips for following this plan? General guidelines   Avoid eating more than 2,300 mg (milligrams) of salt (sodium) a day. If you have hypertension, you may need to reduce your sodium intake to 1,500 mg a day.  Limit alcohol intake to no more than 1 drink a day for nonpregnant women and 2 drinks a day for men. One drink equals 12 oz of beer, 5 oz of wine, or 1 oz of hard liquor.  Work with your health care provider to maintain a healthy body weight or to lose weight. Ask what an ideal weight is for you.  Get at least 30 minutes of exercise that causes your heart to beat faster (aerobic exercise) most days of the week. Activities may include walking, swimming, or biking.  Work with your health care provider or diet and nutrition specialist (dietitian) to adjust your eating plan to your individual calorie needs. Reading food labels   Check food labels for the amount of sodium per serving. Choose foods with less than 5 percent of the Daily Value of sodium. Generally, foods with less than 300 mg of sodium per serving fit into this eating plan.  To find whole grains, look for the word "whole" as the first word in the ingredient list. Shopping   Buy products labeled as "low-sodium" or "no salt added."  Buy fresh foods. Avoid canned foods and premade or frozen meals. Cooking   Avoid adding salt when cooking. Use salt-free seasonings or herbs instead of table salt or sea salt. Check with your health care provider or pharmacist  before using salt substitutes.  Do not fry foods. Cook foods using healthy methods such as baking, boiling, grilling, and broiling instead.  Cook with heart-healthy oils, such as olive, canola, soybean, or sunflower oil. Meal planning    Eat a balanced diet that includes:  5 or more servings of fruits and vegetables each day. At each meal, try to fill half of your plate with fruits and vegetables.  Up to 6-8 servings of whole grains each day.  Less than 6 oz of lean meat, poultry, or fish each day. A 3-oz serving of meat is about the same size as a deck of cards. One egg equals 1 oz.  2 servings of low-fat dairy each day.  A serving of nuts, seeds, or beans 5 times each week.  Heart-healthy fats. Healthy fats called Omega-3 fatty acids are found in foods such as flaxseeds and coldwater fish, like sardines, salmon, and mackerel.  Limit how much you eat of the following:  Canned or prepackaged foods.  Food that is high in trans fat, such as fried foods.  Food that is high in saturated fat, such as fatty meat.  Sweets, desserts, sugary drinks, and other foods with added sugar.  Full-fat dairy products.  Do not salt foods before eating.  Try to eat at least 2 vegetarian meals each week.  Eat more home-cooked food and less restaurant, buffet, and fast food.  When eating at  a restaurant, ask that your food be prepared with less salt or no salt, if possible. What foods are recommended? The items listed may not be a complete list. Talk with your dietitian about what dietary choices are best for you. Grains  Whole-grain or whole-wheat bread. Whole-grain or whole-wheat pasta. Brown rice. Orpah Cobb. Bulgur. Whole-grain and low-sodium cereals. Pita bread. Low-fat, low-sodium crackers. Whole-wheat flour tortillas. Vegetables  Fresh or frozen vegetables (raw, steamed, roasted, or grilled). Low-sodium or reduced-sodium tomato and vegetable juice. Low-sodium or reduced-sodium  tomato sauce and tomato paste. Low-sodium or reduced-sodium canned vegetables. Fruits  All fresh, dried, or frozen fruit. Canned fruit in natural juice (without added sugar). Meat and other protein foods  Skinless chicken or [place]. Ground chicken or [place]. Pork with fat trimmed off. Fish and seafood. Egg whites. Dried beans, peas, or lentils. Unsalted nuts, nut butters, and seeds. Unsalted canned beans. Lean cuts of beef with fat trimmed off. Low-sodium, lean deli meat. Dairy  Low-fat (1%) or fat-free (skim) milk. Fat-free, low-fat, or reduced-fat cheeses. Nonfat, low-sodium ricotta or cottage cheese. Low-fat or nonfat yogurt. Low-fat, low-sodium cheese. Fats and oils  Soft margarine without trans fats. Vegetable oil. Low-fat, reduced-fat, or light mayonnaise and salad dressings (reduced-sodium). Canola, safflower, olive, soybean, and sunflower oils. Avocado. Seasoning and other foods  Herbs. Spices. Seasoning mixes without salt. Unsalted popcorn and pretzels. Fat-free sweets. What foods are not recommended? The items listed may not be a complete list. Talk with your dietitian about what dietary choices are best for you. Grains  Baked goods made with fat, such as croissants, muffins, or some breads. Dry pasta or rice meal packs. Vegetables  Creamed or fried vegetables. Vegetables in a cheese sauce. Regular canned vegetables (not low-sodium or reduced-sodium). Regular canned tomato sauce and paste (not low-sodium or reduced-sodium). Regular tomato and vegetable juice (not low-sodium or reduced-sodium). Rosita Fire. Olives. Fruits  Canned fruit in a light or heavy syrup. Fried fruit. Fruit in cream or butter sauce. Meat and other protein foods  Fatty cuts of meat. Ribs. Fried meat. Tomasa Blase. Sausage. Bologna and other processed lunch meats. Salami. Fatback. Hotdogs. Bratwurst. Salted nuts and seeds. Canned beans with added salt. Canned or smoked fish. Whole eggs or egg yolks. Chicken or [place] with  skin. Dairy  Whole or 2% milk, cream, and half-and-half. Whole or full-fat cream cheese. Whole-fat or sweetened yogurt. Full-fat cheese. Nondairy creamers. Whipped toppings. Processed cheese and cheese spreads. Fats and oils  Butter. Stick margarine. Lard. Shortening. Ghee. Bacon fat. Tropical oils, such as coconut, palm kernel, or palm oil. Seasoning and other foods  Salted popcorn and pretzels. Onion salt, garlic salt, seasoned salt, table salt, and sea salt. Worcestershire sauce. Tartar sauce. Barbecue sauce. Teriyaki sauce. Soy sauce, including reduced-sodium. Steak sauce. Canned and packaged gravies. Fish sauce. Oyster sauce. Cocktail sauce. Horseradish that you find on the shelf. Ketchup. Mustard. Meat flavorings and tenderizers. Bouillon cubes. Hot sauce and Tabasco sauce. Premade or packaged marinades. Premade or packaged taco seasonings. Relishes. Regular salad dressings. Where to find more information:  National Heart, Lung, and Blood Institute: PopSteam.is  American Heart Association: www.heart.org Summary  The DASH eating plan is a healthy eating plan that has been shown to reduce high blood pressure (hypertension). It may also reduce your risk for type 2 diabetes, heart disease, and stroke.  With the DASH eating plan, you should limit salt (sodium) intake to 2,300 mg a day. If you have hypertension, you may need to reduce your sodium intake to  1,500 mg a day.  When on the DASH eating plan, aim to eat more fresh fruits and vegetables, whole grains, lean proteins, low-fat dairy, and heart-healthy fats.  Work with your health care provider or diet and nutrition specialist (dietitian) to adjust your eating plan to your individual calorie needs. This information is not intended to replace advice given to you by your health care provider. Make sure you discuss any questions you have with your health care provider. Document Released: 06/30/2011 Document Revised: 07/04/2016 Document  Reviewed: 07/04/2016 Elsevier Interactive Patient Education  2017 [employer].  Smoking Tobacco Information Smoking tobacco will very likely harm your health. Tobacco contains a poisonous (toxic), colorless chemical called nicotine. Nicotine affects the brain and makes tobacco addictive. This change in your brain can make it hard to stop smoking. Tobacco also has other toxic chemicals that can hurt your body and raise your risk of many cancers. How can smoking tobacco affect me? Smoking tobacco can increase your chances of having serious health conditions, such as:  Cancer. Smoking is most commonly associated with lung cancer, but can lead to cancer in other parts of the body.  Chronic obstructive pulmonary disease (COPD). This is a long-term lung condition that makes it hard to breathe. It also gets worse over time.  High blood pressure (hypertension), heart disease, stroke, or heart attack.  Lung infections, such as pneumonia.  Cataracts. This is when the lenses in the eyes become clouded.  Digestive problems. This may include peptic ulcers, heartburn, and gastroesophageal reflux disease (GERD).  Oral health problems, such as gum disease and tooth loss.  Loss of taste and smell. Smoking can affect your appearance by causing:  Wrinkles.  Yellow or stained teeth, fingers, and fingernails. Smoking tobacco can also affect your social life.  Many workplaces, [employer], hotels, and public places are tobacco-free. This means that you may experience challenges in finding places to smoke when away from home.  The cost of a smoking habit can be expensive. Expenses for someone who smokes come in two ways:  You spend money on a regular basis to buy tobacco.  Your health care costs in the long-term are higher if you smoke.  Tobacco smoke can also affect the health of those around you. Children of smokers have greater chances of:  Sudden infant death syndrome (SIDS).  Ear  infections.  Lung infections. What lifestyle changes can be made?  Do not start smoking. Quit if you already do.  To quit smoking:  Make a plan to quit smoking and commit yourself to it. Look for programs to help you and ask your health care provider for recommendations and ideas.  Talk with your health care provider about using nicotine replacement medicines to help you quit. Medicine replacement medicines include gum, lozenges, patches, sprays, or pills.  Do not replace cigarette smoking with electronic cigarettes, which are commonly called e-cigarettes. The safety of e-cigarettes is not known, and some may contain harmful chemicals.  Avoid places, people, or situations that tempt you to smoke.  If you try to quit but return to smoking, don't give up hope. It is very common for people to try a number of times before they fully succeed. When you feel ready again, give it another try.  Quitting smoking might affect the way you eat as well as your weight. Be prepared to monitor your eating habits. Get support in planning and following a healthy diet.  Ask your health care provider about having regular tests (screenings) to  check for cancer. This may include blood tests, imaging tests, and other tests.  Exercise regularly. Consider taking walks, joining a gym, or doing yoga or exercise classes.  Develop skills to manage your stress. These skills include meditation. What are the benefits of quitting smoking? By quitting smoking, you may:  Lower your risk of getting cancer and other diseases caused by smoking.  Live longer.  Breathe better.  Lower your blood pressure and heart rate.  Stop your addiction to tobacco.  Stop creating secondhand smoke that hurts other people.  Improve your sense of taste and smell.  Look better over time, due to having fewer wrinkles and less staining. What can happen if changes are not made? If you do not stop smoking, you may:  Get cancer and  other diseases.  Develop COPD or other long-term (chronic) lung conditions.  Develop serious problems with your heart and blood vessels (cardiovascular system).  Need more tests to screen for problems caused by smoking.  Have higher, long-term healthcare costs from medicines or treatments related to smoking.  Continue to have worsening changes in your lungs, mouth, and nose. Where to find support: To get support to quit smoking, consider:  Asking your health care provider for more information and resources.  Taking classes to learn more about quitting smoking.  Looking for local organizations that offer resources about quitting smoking.  Joining a support group for people who want to quit smoking in your local community. Where to find more information: You may find more information about quitting smoking from:  HelpGuide.org: www.helpguide.org/articles/addictions/how-to-quit-smoking.htm  BankRights.[employer]: smokefree.gov  American Lung Association: www.lung.org Contact a health care provider if:  You have problems breathing.  Your lips, nose, or fingers turn blue.  You have chest pain.  You are coughing up blood.  You feel faint or you pass out.  You have other noticeable changes that cause you to worry. Summary  Smoking tobacco can negatively affect your health, the health of those around you, your finances, and your social life.  Do not start smoking. Quit if you already do. If you need help quitting, ask your health care provider.  Think about joining a support group for people who want to quit smoking in your local community. There are many effective programs that will help you to quit this behavior. This information is not intended to replace advice given to you by your health care provider. Make sure you discuss any questions you have with your health care provider. Document Released: 07/26/2016 Document Revised: 07/26/2016 Document Reviewed: 07/26/2016 Elsevier  Interactive Patient Education  2017 [employer].

## 2016-11-22 ENCOUNTER — Encounter (INDEPENDENT_AMBULATORY_CARE_PROVIDER_SITE_OTHER): Payer: Self-pay | Admitting: Orthopaedic Surgery

## 2016-11-22 ENCOUNTER — Ambulatory Visit (INDEPENDENT_AMBULATORY_CARE_PROVIDER_SITE_OTHER): Payer: Self-pay | Admitting: Orthopaedic Surgery

## 2016-11-22 DIAGNOSIS — M5413 Radiculopathy, cervicothoracic region: Secondary | ICD-10-CM | POA: Insufficient documentation

## 2016-11-22 NOTE — Progress Notes (Signed)
   Office Visit Note   Patient: [name]           Date of Birth: 30-Jan-1976           MRN: [id number] Visit Date: 11/22/2016              Requested by: Massie Maroon, FNP [address] [place], [place] [address] PCP: Massie Maroon, FNP   Assessment & Plan: Visit Diagnoses:  1. Radiculopathy of cervicothoracic region     Plan: MRI of the cervical spine to rule out structural abnormalities. Follow-up after the MRI.  Follow-Up Instructions: Return in about 2 weeks (around 12/06/2016).   Orders:  No orders of the defined types were placed in this encounter.  No orders of the defined types were placed in this encounter.     Procedures: No procedures performed   Clinical Data: No additional findings.   Subjective: Chief Complaint  Patient presents with  . Left Shoulder - Pain, Follow-up    Patient comes in today for continued left shoulder and arm burning and numbness pain. She was able to tolerate a couple sessions of physical therapy and dry kneeling. Her subacromial injection gave her 2 days of relief. She also has numbness. She does not have any focal findings. She has severe pain with movement of the shoulder.    Review of Systems   Objective: Vital Signs: LMP 11/20/2016   Physical Exam  Ortho Exam Left shoulder and neck exam are stable. She does have sensation down her arm but there is a numbness tingling feeling. Specialty Comments:  No specialty comments available.  Imaging: No results found.   PMFS History: Patient Active Problem List   Diagnosis Date Noted  . Radiculopathy of cervicothoracic region 11/22/2016  . Essential hypertension 11/21/2016  . Injury of left shoulder 08/04/2016  . Muscle spasm of left shoulder 12/31/2015  . Left shoulder pain 12/31/2015  . Tobacco dependence 12/31/2015   Past Medical History:  Diagnosis Date  . Hypertension   . Left shoulder pain     Family History  Problem Relation Age of  Onset  . Cancer Father     Past Surgical History:  Procedure Laterality Date  . CESAREAN SECTION    . TUBAL LIGATION     Social History   Occupational History  . Not on file.   Social History Main Topics  . Smoking status: Current Every Day Smoker    Packs/day: 1.00    Years: 15.00    Types: Cigarettes  . Smokeless tobacco: Never Used     Comment: down to a half pack  . Alcohol use No  . Drug use: No  . Sexual activity: Not on file

## 2016-11-22 NOTE — Addendum Note (Signed)
Addended by: Mardene Celeste B on: 11/22/2016 02:13 PM   Modules accepted: Orders

## 2016-12-02 ENCOUNTER — Ambulatory Visit
Admission: RE | Admit: 2016-12-02 | Discharge: 2016-12-02 | Disposition: A | Discharge status: Home / Self Care | Payer: No Typology Code available for payment source | Source: Ambulatory Visit | Attending: Orthopaedic Surgery | Admitting: Orthopaedic Surgery

## 2016-12-02 DIAGNOSIS — M5413 Radiculopathy, cervicothoracic region: Secondary | ICD-10-CM

## 2016-12-08 ENCOUNTER — Encounter (INDEPENDENT_AMBULATORY_CARE_PROVIDER_SITE_OTHER): Payer: Self-pay | Admitting: Orthopaedic Surgery

## 2016-12-08 ENCOUNTER — Ambulatory Visit (INDEPENDENT_AMBULATORY_CARE_PROVIDER_SITE_OTHER): Payer: Self-pay | Admitting: Orthopaedic Surgery

## 2016-12-08 DIAGNOSIS — G8929 Other chronic pain: Secondary | ICD-10-CM

## 2016-12-08 DIAGNOSIS — M5413 Radiculopathy, cervicothoracic region: Secondary | ICD-10-CM

## 2016-12-08 DIAGNOSIS — M25512 Pain in left shoulder: Secondary | ICD-10-CM

## 2016-12-08 NOTE — Addendum Note (Signed)
Addended by: Albertina ParrGARCIA, Kamdyn Colborn on: 12/08/2016 12:50 PM   Modules accepted: Orders

## 2016-12-08 NOTE — Progress Notes (Signed)
   Office Visit Note   Patient: [name]           Date of Birth: [birth year]           MRN: [id number] Visit Date: 12/08/2016              Requested by: Massie MaroonHollis, Lachina M, FNP [address] [place], [place] [id number] PCP: Massie MaroonHollis, Lachina M, FNP   Assessment & Plan: Visit Diagnoses:  1. Radiculopathy of cervicothoracic region   2. Chronic left shoulder pain     Plan: Cervical spine MRI shows multilevel disc osteophyte complexes. Referral to Dr. Alvester MorinNewton for further evaluation and treatment and consideration of epidural steroid injections.  Follow-Up Instructions: Return if symptoms worsen or fail to improve.   Orders:  No orders of the defined types were placed in this encounter.  No orders of the defined types were placed in this encounter.     Procedures: No procedures performed   Clinical Data: No additional findings.   Subjective: Chief Complaint  Patient presents with  . Neck - Pain, Follow-up    Patient comes back today to review her cervical spine MRI. She continues to have pain.    Review of Systems   Objective: Vital Signs: LMP 11/20/2016   Physical Exam  Ortho Exam Exam is stable. Specialty Comments:  No specialty comments available.  Imaging: No results found.   PMFS History: Patient Active Problem List   Diagnosis Date Noted  . Radiculopathy of cervicothoracic region 11/22/2016  . Essential hypertension 11/21/2016  . Injury of left shoulder 08/04/2016  . Muscle spasm of left shoulder 12/31/2015  . Left shoulder pain 12/31/2015  . Tobacco dependence 12/31/2015   Past Medical History:  Diagnosis Date  . Hypertension   . Left shoulder pain     Family History  Problem Relation Age of Onset  . Cancer Father     Past Surgical History:  Procedure Laterality Date  . CESAREAN SECTION    . TUBAL LIGATION     Social History   Occupational History  . Not on file.   Social History Main Topics  . Smoking status:  Current Every Day Smoker    Packs/day: 1.00    Years: 15.00    Types: Cigarettes  . Smokeless tobacco: Never Used     Comment: down to a half pack  . Alcohol use No  . Drug use: No  . Sexual activity: Not on file

## 2016-12-14 ENCOUNTER — Other Ambulatory Visit: Payer: Self-pay | Admitting: Family Medicine

## 2016-12-14 DIAGNOSIS — M62838 Other muscle spasm: Secondary | ICD-10-CM

## 2016-12-14 DIAGNOSIS — G8929 Other chronic pain: Secondary | ICD-10-CM

## 2016-12-14 DIAGNOSIS — M25512 Pain in left shoulder: Secondary | ICD-10-CM

## 2016-12-14 MED FILL — ?DICLOFENAC SOD DR 75 MG TA: 75 | 15 days supply | Qty: 30 | Fill #0

## 2016-12-14 MED FILL — METHOCARBAMOL 500 MG TABLET: 500 | 30 days supply | Qty: 60 | Fill #1

## 2016-12-15 ENCOUNTER — Encounter (INDEPENDENT_AMBULATORY_CARE_PROVIDER_SITE_OTHER): Payer: Self-pay

## 2016-12-15 ENCOUNTER — Ambulatory Visit (INDEPENDENT_AMBULATORY_CARE_PROVIDER_SITE_OTHER): Payer: Self-pay | Admitting: Physical Medicine and Rehabilitation

## 2016-12-15 ENCOUNTER — Encounter (INDEPENDENT_AMBULATORY_CARE_PROVIDER_SITE_OTHER): Payer: Self-pay | Admitting: Physical Medicine and Rehabilitation

## 2016-12-15 VITALS — BP 130/94 | HR 67

## 2016-12-15 DIAGNOSIS — M25512 Pain in left shoulder: Secondary | ICD-10-CM

## 2016-12-15 DIAGNOSIS — G8929 Other chronic pain: Secondary | ICD-10-CM

## 2016-12-15 DIAGNOSIS — M5412 Radiculopathy, cervical region: Secondary | ICD-10-CM

## 2016-12-15 DIAGNOSIS — M542 Cervicalgia: Secondary | ICD-10-CM

## 2016-12-15 MED ORDER — DIAZEPAM 5 MG PO TABS: ORAL_TABLET | ORAL | 0 refills | Status: DC

## 2016-12-15 MED FILL — METHOCARBAMOL 500 MG TABLET: 500 | 30 days supply | Qty: 60 | Fill #0

## 2016-12-15 NOTE — Progress Notes (Deleted)
Left sided neck and shoulder pain. Numbness/ pain in arm and hand which comes and goes.Pain is constant.

## 2016-12-20 ENCOUNTER — Encounter (INDEPENDENT_AMBULATORY_CARE_PROVIDER_SITE_OTHER): Payer: Self-pay | Admitting: Physical Medicine and Rehabilitation

## 2016-12-20 NOTE — Progress Notes (Signed)
[name] - 41 y.o. female MRN [id number]  Date of birth: Aug 03, 1975  Office Visit Note: Visit Date: 12/15/2016 PCP: Massie Maroon, FNP Referred by: Massie Maroon, FNP  Subjective: Chief Complaint  Patient presents with  . Neck - Pain   HPI: [name] is a 41 year old female who is been seeing Dr. Roda Shutters since March of this year for chronic worsening left shoulder pain. She was treated at [facility] orthopedics and had an MRI of her shoulder performed there. Dr. Roda Shutters reviewed this and felt like she was having some subacromial bursitis as well as myofascial pain syndrome. She's had physical therapy which she tolerated 2 sessions of therapy and did try some dry needling but it was incredibly painful and made her situation worsens she did not go back to that. Dr. Roda Shutters said that her injection helped for a couple of days. This is a subacromial injection. She says that she is having neck and shoulder pain she does get some numbness type dysesthesia in the arm and hand which comes and goes. The pain in the shoulder as constant. He did obtain an MRI of her cervical spine which is reviewed below. He felt like she might be a candidate for interventional spine procedure. She's had no prior cervical surgery or injections. She has no real right-sided complaints. No associated headaches. She has tried anti-inflammatories as well as pain medication and muscle relaxers without any help. She has had physical therapy a few sessions with no help. She denies any focal weakness or balance difficulties or bowel or bladders difficulties.    Review of Systems  Constitutional: Negative for chills, fever, malaise/fatigue and weight loss.  HENT: Negative for hearing loss and sinus pain.   Eyes: Negative for blurred vision, double vision and photophobia.  Respiratory: Negative for cough and shortness of breath.   Cardiovascular: Negative for chest pain, palpitations and leg swelling.  Gastrointestinal: Negative  for abdominal pain, nausea and vomiting.  Genitourinary: Negative for flank pain.  Musculoskeletal: Positive for joint pain and neck pain. Negative for myalgias.  Skin: Negative for itching and rash.  Neurological: Positive for tingling. Negative for tremors, focal weakness and weakness.  Endo/Heme/Allergies: Negative.   Psychiatric/Behavioral: Negative for depression.  All other systems reviewed and are negative.  Otherwise per HPI.  Assessment & Plan: Visit Diagnoses:  1. Cervical radiculopathy   2. Cervicalgia   3. Chronic left shoulder pain     Plan: Findings:  Chronic worsening severe at times and constant left shoulder pain with some intermittent tingling numbness type dysesthesia could be consistent with before meals 5 to C6-C7 radicular type pain. MRI of the cervical spine does show interesting reversal of normal lordosis and spondylitic change with some narrowing of the canal but no frank stenosis at C5-C6 and C6-7 with some disc protrusion without focal compression. Even though the report states more right-sided would you look at this there is reason to believe it could irritate either side. Again no frank nerve compression that she has failed conservative care including physical therapy as well as medication management and anti-inflammatories and time. I do think it is warranted at this point to complete a diagnostic and hopefully therapeutic C7-T1 intralaminar epidural steroid injection to the left. If the injection is well placed and she just does not get any relief I would have her return to Dr. Roda Shutters for referral for possible electrodiagnostic study due to having a positive Phalen's test., continued work with myofascial pain syndrome and left  shoulder pain. She does have physical exam findings where her shoulder does hurt with movement and positive left Phalen's test. I spent more than 25 minutes speaking face-to-face with the patient with 50% of the time in counseling. I did provide  preprocedure Valium for anxiety.    Meds & Orders:  Meds ordered this encounter  Medications  . diazepam (VALIUM) 5 MG tablet    Sig: Take 1 by mouth 1 to 2 hours pre-procedure. May repeat if necessary.    Dispense:  2 tablet    Refill:  0   No orders of the defined types were placed in this encounter.   Follow-up: Return for C7-T1 intralaminar epidural steroid injection.   Procedures: No procedures performed  No notes on file   Clinical History: MRI CERVICAL SPINE WITHOUT CONTRAST 12/02/2016  FINDINGS: Alignment: There is straightening and slight kyphosis of the cervical spine.  Vertebrae: No fracture or primary bone lesion.  Cord: No cord compression or primary cord lesion.  Posterior Fossa, vertebral arteries, paraspinal tissues: Normal  Disc levels:  Foramen magnum, C1-2 and C2-3:  Normal.  C3-4:  Tiny central disc bulge.  No stenosis.  C4-5: Shallow midline disc herniation, effacing the ventral subarachnoid space. No foraminal extension.  C5-6: Endplate osteophytes and bulging of the disc. Effacement of the ventral subarachnoid space. Mild foraminal encroachment by osteophytes, right more than left, but without distinct neural compression.  C6-7: Endplate osteophytes and bulging of the disc. Narrowing of the ventral subarachnoid space. Mild foraminal encroachment by osteophytes, right more than left, but without distinct neural compression.  C7-T1:  Normal interspace.  T1-2: Normal interspace.  IMPRESSION: No distinct cause of left-sided symptoms is identified.  C3-4: Tiny central disc bulge.  No stenosis.  C4-5: Shallow midline disc herniation. Effacement of the ventral subarachnoid space but without cord compression. No foraminal narrowing.  C5-6 and C6-7: Spondylosis with endplate osteophytes and bulging of the disc, narrowing the ventral subarachnoid space. Mild foraminal encroachment, right more than left. Nerve root  compression is not demonstrated. Morphologically, it would be more likely on the right.  She reports that she has been smoking Cigarettes.  She has a 15.00 pack-year smoking history. She has never used smokeless tobacco.   Recent Labs  08/04/16 1503  HGBA1C 4.6    Objective:  VS:  HT:    WT:   BMI:     BP:(!) 130/94  HR:67bpm  TEMP: ( )  RESP:  Physical Exam  Constitutional: She is oriented to person, place, and time. She appears well-developed and well-nourished.  Eyes: Conjunctivae and EOM are normal. Pupils are equal, round, and reactive to light.  Neck: No thyromegaly present.  Cardiovascular: Normal rate and intact distal pulses.   Pulmonary/Chest: Effort normal.  Musculoskeletal:  Cervical range of motion is limited with extension rotation more to the left than right. She has a negative Spurling's test. She does have trigger points in the levator scapula and trapezius and infraspinatus. She does have some shoulder impingement with external rotation of the left shoulder compared to right. She has an equivocally positive Hawkins test. She has good strength bilaterally without any deficits including long finger flexion and wrist extension and finger abduction. She has a positive Phalen's test over the left.  Lymphadenopathy:    She has no cervical adenopathy.  Neurological: She is alert and oriented to person, place, and time. She exhibits normal muscle tone.  Skin: Skin is warm and dry. No rash noted. No erythema.  Psychiatric:  She has a normal mood and affect. Her behavior is normal.  Nursing note and vitals reviewed.   Ortho Exam Imaging: No results found.  Past Medical/Family/Surgical/Social History: Medications & Allergies reviewed per EMR Patient Active Problem List   Diagnosis Date Noted  . Radiculopathy of cervicothoracic region 11/22/2016  . Essential hypertension 11/21/2016  . Injury of left shoulder 08/04/2016  . Muscle spasm of left shoulder 12/31/2015  .  Left shoulder pain 12/31/2015  . Tobacco dependence 12/31/2015   Past Medical History:  Diagnosis Date  . Hypertension   . Left shoulder pain    Family History  Problem Relation Age of Onset  . Cancer Father    Past Surgical History:  Procedure Laterality Date  . CESAREAN SECTION    . TUBAL LIGATION     Social History   Occupational History  . Not on file.   Social History Main Topics  . Smoking status: Current Every Day Smoker    Packs/day: 1.00    Years: 15.00    Types: Cigarettes  . Smokeless tobacco: Never Used     Comment: down to a half pack  . Alcohol use No  . Drug use: No  . Sexual activity: Not on file

## 2016-12-21 ENCOUNTER — Ambulatory Visit (INDEPENDENT_AMBULATORY_CARE_PROVIDER_SITE_OTHER): Payer: No Typology Code available for payment source | Admitting: Family Medicine

## 2016-12-21 ENCOUNTER — Encounter: Payer: Self-pay | Admitting: Family Medicine

## 2016-12-21 VITALS — BP 136/88 | HR 74 | Temp 98.6°F | Resp 14 | Ht 60.0 in | Wt 110.0 lb

## 2016-12-21 DIAGNOSIS — G8929 Other chronic pain: Secondary | ICD-10-CM

## 2016-12-21 DIAGNOSIS — M5413 Radiculopathy, cervicothoracic region: Secondary | ICD-10-CM

## 2016-12-21 DIAGNOSIS — M25512 Pain in left shoulder: Secondary | ICD-10-CM

## 2016-12-21 DIAGNOSIS — G629 Polyneuropathy, unspecified: Secondary | ICD-10-CM

## 2016-12-21 DIAGNOSIS — I10 Essential (primary) hypertension: Secondary | ICD-10-CM

## 2016-12-21 MED ORDER — DULOXETINE HCL 20 MG PO CPEP: 20.0000 mg | ORAL_CAPSULE | Freq: Every day | ORAL | 3 refills | Status: DC

## 2016-12-21 MED FILL — ?AMLODIPINE BESYLATE 5 MG T: 5 | 30 days supply | Qty: 30 | Fill #1

## 2016-12-21 MED FILL — DULoxetine HCL 20 MG CPEP: 20 | 30 days supply | Qty: 30 | Fill #0
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
## 2016-12-21 NOTE — Patient Instructions (Addendum)
Depression:   Will start a trial of Cymbalta 20 mg daily for neuropathy. Will resume all other medications.   Hypertension:  Blood pressure is at goal on current medication regimen       Neuropathic Pain Neuropathic pain is pain caused by damage to the nerves that are responsible for certain sensations in your body (sensory nerves). The pain can be caused by damage to:  The sensory nerves that send signals to your spinal cord and brain (peripheral nervous system).  The sensory nerves in your brain or spinal cord (central nervous system). Neuropathic pain can make you more sensitive to pain. What would be a minor sensation for most people may feel very painful if you have neuropathic pain. This is usually a long-term condition that can be difficult to treat. The type of pain can differ from person to person. It may start suddenly (acute), or it may develop slowly and last for a long time (chronic). Neuropathic pain may come and go as damaged nerves heal or may stay at the same level for years. It often causes emotional distress, loss of sleep, and a lower quality of life. What are the causes? The most common cause of damage to a sensory nerve is diabetes. Many other diseases and conditions can also cause neuropathic pain. Causes of neuropathic pain can be classified as:  Toxic. Many drugs and chemicals can cause toxic damage. The most common cause of toxic neuropathic pain is damage from drug treatment for cancer (chemotherapy).  Metabolic. This type of pain can happen when a disease causes imbalances that damage nerves. Diabetes is the most common of these diseases. Vitamin B deficiency caused by long-term alcohol abuse is another common cause.  Traumatic. Any injury that cuts, crushes, or stretches a nerve can cause damage and pain. A common example is feeling pain after losing an arm or leg (phantom limb pain).  Compression-related. If a sensory nerve gets trapped or compressed for a  long period of time, the blood supply to the nerve can be cut off.  Vascular. Many blood vessel diseases can cause neuropathic pain by decreasing blood supply and oxygen to nerves.  Autoimmune. This type of pain results from diseases in which the body's defense system mistakenly attacks sensory nerves. Examples of autoimmune diseases that can cause neuropathic pain include lupus and multiple sclerosis.  Infectious. Many types of viral infections can damage sensory nerves and cause pain. Shingles infection is a common cause of this type of pain.  Inherited. Neuropathic pain can be a symptom of many diseases that are passed down through families (genetic). What are the signs or symptoms? The main symptom is pain. Neuropathic pain is often described as:  Burning.  Shock-like.  Stinging.  Hot or cold.  Itching. How is this diagnosed? No single test can diagnose neuropathic pain. Your health care provider will do a physical exam and ask you about your pain. You may use a pain scale to describe how bad your pain is. You may also have tests to see if you have a high sensitivity to pain and to help find the cause and location of any sensory nerve damage. These tests may include:  Imaging studies, such as:  X-rays.  CT scan.  MRI.  Nerve conduction studies to test how well nerve signals travel through your sensory nerves (electrodiagnostic testing).  Stimulating your sensory nerves through electrodes on your skin and measuring the response in your spinal cord and brain (somatosensory evoked potentials). How is this  treated? Treatment for neuropathic pain may change over time. You may need to try different treatment options or a combination of treatments. Some options include:  Over-the-counter pain relievers.  Prescription medicines. Some medicines used to treat other conditions may also help neuropathic pain. These include medicines to:  Control seizures  (anticonvulsants).  Relieve depression (antidepressants).  Prescription-strength pain relievers (narcotics). These are usually used when other pain relievers do not help.  Transcutaneous nerve stimulation (TENS). This uses electrical currents to block painful nerve signals. The treatment is painless.  Topical and local anesthetics. These are medicines that numb the nerves. They can be injected as a nerve block or applied to the skin.  Alternative treatments, such as:  Acupuncture.  Meditation.  Massage.  Physical therapy.  Pain management programs.  Counseling. Follow these instructions at home:  Learn as much as you can about your condition.  Take medicines only as directed by your health care provider.  Work closely with all your health care providers to find what works best for you.  Have a good support system at home.  Consider joining a chronic pain support group. Contact a health care provider if:  Your pain treatments are not helping.  You are having side effects from your medicines.  You are struggling with fatigue, mood changes, depression, or anxiety. This information is not intended to replace advice given to you by your health care provider. Make sure you discuss any questions you have with your health care provider. Document Released: 04/07/2004 Document Revised: 01/29/2016 Document Reviewed: 12/19/2013 Elsevier Interactive Patient Education  2017 [employer].

## 2016-12-28 ENCOUNTER — Ambulatory Visit (INDEPENDENT_AMBULATORY_CARE_PROVIDER_SITE_OTHER): Payer: Self-pay

## 2016-12-28 ENCOUNTER — Encounter (INDEPENDENT_AMBULATORY_CARE_PROVIDER_SITE_OTHER): Payer: Self-pay | Admitting: Physical Medicine and Rehabilitation

## 2016-12-28 ENCOUNTER — Ambulatory Visit (INDEPENDENT_AMBULATORY_CARE_PROVIDER_SITE_OTHER): Payer: Self-pay | Admitting: Physical Medicine and Rehabilitation

## 2016-12-28 VITALS — BP 126/84 | HR 86 | Temp 98.5°F

## 2016-12-28 DIAGNOSIS — M5412 Radiculopathy, cervical region: Secondary | ICD-10-CM

## 2016-12-28 MED ORDER — METHYLPREDNISOLONE ACETATE 80 MG/ML IJ SUSP
80.0000 mg | Freq: Once | INTRAMUSCULAR | Status: AC
Start: 2016-12-28 — End: 2016-12-28
  Administered 2016-12-28: 80 mg

## 2016-12-28 MED ORDER — LIDOCAINE HCL (PF) 1 % IJ SOLN
2.0000 mL | Freq: Once | INTRAMUSCULAR | Status: AC
  Administered 2016-12-28: 2 mL

## 2016-12-28 NOTE — Progress Notes (Deleted)
Continued neck pain with radiating pain to left shoulder down to hand. Complains of both pain and numbness radiating down left arm to left index and middle fingers.

## 2016-12-28 NOTE — Patient Instructions (Signed)

## 2016-12-28 NOTE — Procedures (Signed)
Cervical Epidural Steroid Injection - Interlaminar Approach with Fluoroscopic Guidance  Patient: [name]      Date of Birth: 1976/02/18 MRN: [id number] PCP: Massie MaroonHollis, Lachina M, FNP      Visit Date: 12/28/2016   Mrs. Ave FilterChandler is a 41 year old female with left neck and shoulder and arm pain. She is here today for planned C7-T1 interlaminar epidural steroid injection. Please see our prior evaluation and management note for further details and justification.  Universal Protocol:    Date/Time: 06/06/189:22 AM  Consent Given By: the patient  Position: PRONE  Additional Comments: Vital signs were monitored before and after the procedure. Patient was prepped and draped in the usual sterile fashion. The correct patient, procedure, and site was verified.   Injection Procedure Details:  Procedure Site One Meds Administered:  Meds ordered this encounter  Medications  . lidocaine (PF) (XYLOCAINE) 1 % injection 2 mL  . methylPREDNISolone acetate (DEPO-MEDROL) injection 80 mg     Laterality: Left  Location/Site:  C7-T1  Needle size: 20 G  Needle type: Touhy  Needle Placement: Paramedian epidural space  Findings:  -Contrast Used: 0.5 mL iohexol 180 mg iodine/mL   -Comments: Excellent flow of contrast into the epidural space.  Procedure Details: Using a paramedian approach from the side mentioned above, the region overlying the inferior lamina was localized under fluoroscopic visualization and the soft tissues overlying this structure were infiltrated with 4 ml. of 1% Lidocaine without Epinephrine. A # 20 gauge, Tuohy needle was inserted into the epidural space using a paramedian approach.  The epidural space was localized using loss of resistance along with lateral and contralateral oblique bi-planar fluoroscopic views.  After negative aspirate for air, blood, and CSF, a 2 ml. volume of Isovue-250 was injected into the epidural space and the flow of contrast was observed.  Radiographs were obtained for documentation purposes.   The injectate was administered into the level noted above.  Additional Comments:  The patient tolerated the procedure well Dressing: Band-Aid    Post-procedure details: Patient was observed during the procedure. Post-procedure instructions were reviewed.  Patient left the clinic in stable condition.

## 2017-01-08 ENCOUNTER — Emergency Department (HOSPITAL_COMMUNITY): Payer: No Typology Code available for payment source

## 2017-01-08 ENCOUNTER — Encounter (HOSPITAL_COMMUNITY): Payer: Self-pay | Admitting: Emergency Medicine

## 2017-01-08 ENCOUNTER — Emergency Department (HOSPITAL_COMMUNITY)
Admission: EM | Admit: 2017-01-08 | Discharge: 2017-01-08 | Disposition: A | Discharge status: Home / Self Care | Payer: No Typology Code available for payment source | Attending: Emergency Medicine | Admitting: Emergency Medicine

## 2017-01-08 DIAGNOSIS — S92352A Displaced fracture of fifth metatarsal bone, left foot, initial encounter for closed fracture: Secondary | ICD-10-CM

## 2017-01-08 DIAGNOSIS — I10 Essential (primary) hypertension: Secondary | ICD-10-CM | POA: Insufficient documentation

## 2017-01-08 DIAGNOSIS — F17210 Nicotine dependence, cigarettes, uncomplicated: Secondary | ICD-10-CM | POA: Insufficient documentation

## 2017-01-08 DIAGNOSIS — Z79899 Other long term (current) drug therapy: Secondary | ICD-10-CM | POA: Insufficient documentation

## 2017-01-08 DIAGNOSIS — Y999 Unspecified external cause status: Secondary | ICD-10-CM | POA: Insufficient documentation

## 2017-01-08 DIAGNOSIS — Y929 Unspecified place or not applicable: Secondary | ICD-10-CM | POA: Insufficient documentation

## 2017-01-08 DIAGNOSIS — W1839XA Other fall on same level, initial encounter: Secondary | ICD-10-CM | POA: Insufficient documentation

## 2017-01-08 DIAGNOSIS — Y9302 Activity, running: Secondary | ICD-10-CM | POA: Insufficient documentation

## 2017-01-08 MED ORDER — ACETAMINOPHEN 500 MG PO TABS: 500.0000 mg | ORAL_TABLET | Freq: Four times a day (QID) | ORAL | 0 refills | Status: DC | PRN

## 2017-01-08 MED ORDER — ONDANSETRON 4 MG PO TBDP
4.0000 mg | ORAL_TABLET | Freq: Once | ORAL | Status: AC
  Administered 2017-01-08: 4 mg via ORAL
  Filled 2017-01-08: qty 1

## 2017-01-08 MED ORDER — IBUPROFEN 800 MG PO TABS: 800.0000 mg | ORAL_TABLET | Freq: Three times a day (TID) | ORAL | 0 refills | Status: DC

## 2017-01-08 MED ORDER — OXYCODONE-ACETAMINOPHEN 5-325 MG PO TABS
1.0000 | ORAL_TABLET | ORAL | Status: DC | PRN
  Administered 2017-01-08: 1 via ORAL
  Filled 2017-01-08: qty 1
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
## 2017-01-08 NOTE — ED Provider Notes (Signed)
[facility]-EMERGENCY DEPT Provider Note   CSN: [id number] Arrival date & time: 01/08/17  1210  By signing my name below, I, Linna DarnerRussell Turner, attest that this documentation has been prepared under the direction and in the presence of non-physician practitioner, Buel ReamAlexandra Chealsey Miyamoto, PA-C. Electronically Signed: Linna Darnerussell Turner, Scribe. 01/08/2017. 2:56 PM.  History   Chief Complaint Chief Complaint  Patient presents with  . Foot Injury   The history is provided by the patient. No language interpreter was used.    HPI Comments: [name] is a 41 y.o. female who presents to the Emergency Department for evaluation of a left foot injury sustained yesterday. Patient was running while wearing sandals and sustained a mechanical fall after her left foot "twisted". No head trauma or LOC. Patient has had severe pain along the left lateral foot since the injury occurred as well as some swelling. She also reports a small abrasion to the dorsal aspect of her left pinky toe sustained during the fall. No medications or treatments tried PTA. Patient notes she has been unable to bear weight on her left foot since the injury occurred secondary to pain. She denies left ankle pain, numbness/tingling, or any other associated symptoms.  Past Medical History:  Diagnosis Date  . Hypertension   . Left shoulder pain     Patient Active Problem List   Diagnosis Date Noted  . Radiculopathy of cervicothoracic region 11/22/2016  . Essential hypertension 11/21/2016  . Injury of left shoulder 08/04/2016  . Muscle spasm of left shoulder 12/31/2015  . Left shoulder pain 12/31/2015  . Tobacco dependence 12/31/2015    Past Surgical History:  Procedure Laterality Date  . CESAREAN SECTION    . TUBAL LIGATION      OB History    No data available       Home Medications    Prior to Admission medications   Medication Sig Start Date End Date Taking? Authorizing Provider  acetaminophen (TYLENOL) 500 MG tablet Take 1  tablet (500 mg total) by mouth every 6 (six) hours as needed. 01/08/17   Luann Aspinwall, Waylan BogaAlexandra M, PA-C  amLODipine (NORVASC) 5 MG tablet Take 1 tablet (5 mg total) by mouth daily. 11/21/16   Massie MaroonHollis, Lachina M, FNP  Blood Pressure Monitoring (BLOOD PRESSURE MONITOR/S CUFF) MISC 1 each by Does not apply route daily. 11/21/16   Massie MaroonHollis, Lachina M, FNP  diazepam (VALIUM) 5 MG tablet Take 1 by mouth 1 to 2 hours pre-procedure. May repeat if necessary. 12/15/16   Tyrell AntonioNewton, Frederic, MD  diclofenac (VOLTAREN) 75 MG EC tablet TAKE 1 TABLET BY MOUTH 2 TIMES DAILY 12/14/16   Massie MaroonHollis, Lachina M, FNP  DULoxetine (CYMBALTA) 20 MG capsule Take 1 capsule (20 mg total) by mouth daily. 12/21/16   Massie MaroonHollis, Lachina M, FNP  ibuprofen (ADVIL,MOTRIN) 800 MG tablet Take 1 tablet (800 mg total) by mouth 3 (three) times daily. 01/08/17   Berniece Abid, Waylan BogaAlexandra M, PA-C  methocarbamol (ROBAXIN) 500 MG tablet TAKE 1 TABLET BY MOUTH 2 TIMES DAILY 12/14/16   Massie MaroonHollis, Lachina M, FNP    Family History Family History  Problem Relation Age of Onset  . Cancer Father     Social History Social History  Substance Use Topics  . Smoking status: Current Every Day Smoker    Packs/day: 1.00    Years: 15.00    Types: Cigarettes  . Smokeless tobacco: Never Used     Comment: down to a half pack  . Alcohol use No     Allergies  Patient has no known allergies.   Review of Systems Review of Systems  Musculoskeletal: Positive for arthralgias and joint swelling.  Skin: Positive for wound.  Neurological: Negative for numbness.   Physical Exam Updated Vital Signs BP (!) 119/94 (BP Location: Right Arm)   Pulse 76   Temp 97.9 F (36.6 C) (Oral)   Resp 18   Ht 5' (1.524 m)   Wt 49.9 kg (110 lb)   LMP 12/20/2016   SpO2 100%   BMI 21.48 kg/m   Physical Exam  Constitutional: She appears well-developed and well-nourished. No distress.  HENT:  Head: Normocephalic and atraumatic.  Mouth/Throat: Oropharynx is clear and moist. No oropharyngeal  exudate.  Eyes: Conjunctivae are normal. Pupils are equal, round, and reactive to light. Right eye exhibits no discharge. Left eye exhibits no discharge. No scleral icterus.  Neck: Normal range of motion. Neck supple. No thyromegaly present.  Cardiovascular: Normal rate, regular rhythm, normal heart sounds and intact distal pulses.  Exam reveals no gallop and no friction rub.   No murmur heard. Pulmonary/Chest: Effort normal and breath sounds normal. No stridor. No respiratory distress. She has no wheezes. She has no rales.  Abdominal: She exhibits no distension.  Musculoskeletal: She exhibits no edema.       Left foot: There is tenderness, bony tenderness and swelling. There is normal capillary refill.       Feet:  L foot: Tenderness over the fifth metatarsal; range of motion of digits limited but intact due to pain; minor abrasion to left fifth digit; normal sensation; cap refill < 2 secs; no tenderness to palpation of the ankle  Lymphadenopathy:    She has no cervical adenopathy.  Neurological: She is alert. Coordination normal.  Skin: Skin is warm and dry. No rash noted. She is not diaphoretic. No pallor.  Psychiatric: She has a normal mood and affect.  Nursing note and vitals reviewed.     ED Treatments / Results  Labs (all labs ordered are listed, but only abnormal results are displayed) Labs Reviewed - No data to display  EKG  EKG Interpretation None       Radiology Dg Foot Complete Left  Result Date: 01/08/2017 CLINICAL DATA:  Pain after fall. EXAM: LEFT FOOT - COMPLETE 3+ VIEW COMPARISON:  None. FINDINGS: There is a displaced spiral fracture through the fifth metatarsal. IMPRESSION: Displaced spiral fracture through the fifth metatarsal. Electronically Signed   By: Gerome Sam III M.D   On: 01/08/2017 13:58    Procedures Procedures (including critical care time)  SPLINT APPLICATION Authorized by: Buel Ream, PA-C Consent: Verbal consent obtained. Risks  and benefits: risks, benefits and alternatives were discussed Consent given by: patient Splint applied by: orthopedic technician Location details: L foot Splint type: posterior short leg Supplies used: ace wrap, fiberglass Post-procedure: The splinted body part was neurovascularly unchanged following the procedure. Patient tolerance: Patient tolerated the procedure well with no immediate complications.  DIAGNOSTIC STUDIES: Oxygen Saturation is 97% on RA, normal by my interpretation.    COORDINATION OF CARE: 2:55 PM Discussed treatment plan with pt at bedside and pt agreed to plan.  Medications Ordered in ED Medications  oxyCODONE-acetaminophen (PERCOCET/ROXICET) 5-325 MG per tablet 1 tablet (1 tablet Oral Given 01/08/17 1537)  ondansetron (ZOFRAN-ODT) disintegrating tablet 4 mg (4 mg Oral Given 01/08/17 1511)     Initial Impression / Assessment and Plan / ED Course  I have reviewed the triage vital signs and the nursing notes.  Pertinent labs & imaging  results that were available during my care of the patient were reviewed by me and considered in my medical decision making (see chart for details).     Patient with displaced spiral fracture through the fifth metatarsal on x-ray. Patient placed in posterior splint and given crutches. Patient given ibuprofen and Tylenol for pain control. Supportive treatment discussed including elevation. Patient is to follow-up with orthopedics for further evaluation and treatment. Return precautions discussed. Patient understands and agrees with plan. Patient vitals stable throughout ED course and discharged in satisfactory condition.  Final Clinical Impressions(s) / ED Diagnoses   Final diagnoses:  Closed displaced fracture of fifth metatarsal bone of left foot, initial encounter    New Prescriptions Discharge Medication List as of 01/08/2017  3:11 PM    START taking these medications   Details  ibuprofen (ADVIL,MOTRIN) 800 MG tablet Take 1  tablet (800 mg total) by mouth 3 (three) times daily., Starting Sun 01/08/2017, Print       I personally performed the services described in this documentation, which was scribed in my presence. The recorded information has been reviewed and is accurate.     Emi Holes, PA-C 01/08/17 1557    Jacalyn Lefevre, MD 01/08/17 [phone]

## 2017-01-08 NOTE — ED Triage Notes (Signed)
Pt c/o left foot pain and swelling onset last night after falling and twisting foot. No other injury.

## 2017-01-10 ENCOUNTER — Ambulatory Visit (INDEPENDENT_AMBULATORY_CARE_PROVIDER_SITE_OTHER): Payer: Self-pay | Admitting: Orthopaedic Surgery

## 2017-01-10 ENCOUNTER — Encounter (INDEPENDENT_AMBULATORY_CARE_PROVIDER_SITE_OTHER): Payer: Self-pay | Admitting: Orthopaedic Surgery

## 2017-01-10 VITALS — BP 126/89 | HR 88 | Ht 60.0 in | Wt 110.0 lb

## 2017-01-10 DIAGNOSIS — S92352A Displaced fracture of fifth metatarsal bone, left foot, initial encounter for closed fracture: Secondary | ICD-10-CM

## 2017-01-10 MED FILL — IBUPROFEN 800 MG TABLET: 800 | 7 days supply | Qty: 21 | Fill #0
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
## 2017-01-13 ENCOUNTER — Telehealth (INDEPENDENT_AMBULATORY_CARE_PROVIDER_SITE_OTHER): Payer: Self-pay | Admitting: Physical Medicine and Rehabilitation

## 2017-01-13 NOTE — Telephone Encounter (Signed)
Patient scheduled for 7/10. Would like Valium pre. [facility].

## 2017-01-13 NOTE — Telephone Encounter (Signed)
Yes x1 then follow up with referring

## 2017-01-16 MED ORDER — DIAZEPAM 5 MG PO TABS: ORAL_TABLET | ORAL | 0 refills | Status: DC

## 2017-01-16 NOTE — Telephone Encounter (Signed)
printed

## 2017-01-16 NOTE — Telephone Encounter (Signed)
Faxed

## 2017-01-16 NOTE — Addendum Note (Signed)
Addended by: Ashok NorrisNEWTON, Bralyn Espino K on: 01/16/2017 08:08 AM   Modules accepted: Orders

## 2017-01-20 ENCOUNTER — Other Ambulatory Visit: Payer: Self-pay | Admitting: Family Medicine

## 2017-01-20 MED FILL — AMLODIPINE BESYLATE 5 MG TA: 5 | 30 days supply | Qty: 30 | Fill #2

## 2017-01-20 MED FILL — IBUPROFEN 800 MG TABLET: 800 | 7 days supply | Qty: 21 | Fill #0

## 2017-01-27 ENCOUNTER — Encounter (INDEPENDENT_AMBULATORY_CARE_PROVIDER_SITE_OTHER): Payer: Self-pay | Admitting: Orthopaedic Surgery

## 2017-01-27 ENCOUNTER — Ambulatory Visit (INDEPENDENT_AMBULATORY_CARE_PROVIDER_SITE_OTHER): Payer: Self-pay

## 2017-01-27 ENCOUNTER — Ambulatory Visit (INDEPENDENT_AMBULATORY_CARE_PROVIDER_SITE_OTHER): Payer: Self-pay | Admitting: Orthopaedic Surgery

## 2017-01-27 VITALS — BP 113/75 | HR 75 | Ht 60.0 in | Wt 110.0 lb

## 2017-01-27 DIAGNOSIS — S92302D Fracture of unspecified metatarsal bone(s), left foot, subsequent encounter for fracture with routine healing: Secondary | ICD-10-CM

## 2017-01-27 DIAGNOSIS — M79672 Pain in left foot: Secondary | ICD-10-CM

## 2017-01-27 NOTE — Progress Notes (Signed)
Office Visit Note   Patient: [name]           Date of Birth: Nov 11, 1975           MRN: [id number] Visit Date: 01/27/2017              Requested by: Massie Maroon, FNP [address] [place], [place] [address] PCP: Massie Maroon, FNP   Assessment & Plan: Visit Diagnoses:  1. Left foot pain   2. Closed fracture of shaft of metatarsal bone of left foot with routine healing, subsequent encounter     Plan: She can continue with the splint the. She'll look for shoe that has a rigid sole that's not bendable. Return in 4 weeks for repeat exam.  Follow-Up Instructions: Return in about 4 weeks (around 02/24/2017).   Orders:  Orders Placed This Encounter  Procedures  . XR Foot Complete Left   No orders of the defined types were placed in this encounter.     Procedures: No procedures performed   Clinical Data: No additional findings.   Subjective: Chief Complaint  Patient presents with  . Left Foot - Follow-up    HPI patient returns follow-up of left fifth close comminuted fifth metatarsal shaft fracture. She still not able to weight-bear. Swelling is down. Skin is intact. She's been using her crutches as instructed in elevating her foot.  Review of Systems 14 point review of systems is updated and is unchanged from last office visit 2 weeks ago other than as mentioned in history of present illness   Objective: Vital Signs: BP 113/75   Pulse 75   Ht 5' (1.524 m)   Wt 110 lb (49.9 kg)   BMI 21.48 kg/m   Physical Exam  Constitutional: She is oriented to person, place, and time. She appears well-developed.  HENT:  Head: Normocephalic.  Right Ear: External ear normal.  Left Ear: External ear normal.  Eyes: Pupils are equal, round, and reactive to light.  Neck: No tracheal deviation present. No thyromegaly present.  Cardiovascular: Normal rate.   Pulmonary/Chest: Effort normal.  Abdominal: Soft.  Musculoskeletal:  Patient still tender over  the fracture site. She had an abrasion over the base of the fifth toe at the time of her injury this is healing. Ecchymosis has resolved. She has mild midfoot swelling.  Neurological: She is alert and oriented to person, place, and time.  Skin: Skin is warm and dry.  Psychiatric: She has a normal mood and affect. Her behavior is normal.    Ortho Exam  Specialty Comments:  No specialty comments available.  Imaging: Xr Foot Complete Left  Result Date: 01/27/2017 3 x-rays left foot obtained. This shows the shaft fracture in unchanged alignment there is an no radiographic healing at this point. Impression fifth metatarsal shaft fracture in acceptable alignment with some comminution. No periosteal callus formation noted at this time.    PMFS History: Patient Active Problem List   Diagnosis Date Noted  . Radiculopathy of cervicothoracic region 11/22/2016  . Essential hypertension 11/21/2016  . Injury of left shoulder 08/04/2016  . Muscle spasm of left shoulder 12/31/2015  . Left shoulder pain 12/31/2015  . Tobacco dependence 12/31/2015   Past Medical History:  Diagnosis Date  . Hypertension   . Left shoulder pain     Family History  Problem Relation Age of Onset  . Cancer Father     Past Surgical History:  Procedure Laterality Date  . CESAREAN SECTION    .  TUBAL LIGATION     Social History   Occupational History  . Not on file.   Social History Main Topics  . Smoking status: Current Every Day Smoker    Packs/day: 1.00    Years: 15.00    Types: Cigarettes  . Smokeless tobacco: Never Used     Comment: down to a half pack  . Alcohol use No  . Drug use: No  . Sexual activity: Not on file

## 2017-01-30 ENCOUNTER — Encounter: Payer: Self-pay | Admitting: Family Medicine

## 2017-01-30 ENCOUNTER — Ambulatory Visit (INDEPENDENT_AMBULATORY_CARE_PROVIDER_SITE_OTHER): Payer: No Typology Code available for payment source | Admitting: Family Medicine

## 2017-01-30 VITALS — BP 124/80 | HR 78 | Temp 98.2°F | Resp 14 | Ht 60.0 in | Wt 111.0 lb

## 2017-01-30 DIAGNOSIS — R8299 Other abnormal findings in urine: Secondary | ICD-10-CM

## 2017-01-30 DIAGNOSIS — R82998 Other abnormal findings in urine: Secondary | ICD-10-CM

## 2017-01-30 DIAGNOSIS — L02411 Cutaneous abscess of right axilla: Secondary | ICD-10-CM

## 2017-01-30 DIAGNOSIS — I10 Essential (primary) hypertension: Secondary | ICD-10-CM

## 2017-01-30 LAB — CBC WITH DIFFERENTIAL/PLATELET
BASOS PCT: 0 %
Basophils Absolute: 0 cells/uL (ref 0–200)
Eosinophils Absolute: 53 cells/uL (ref 15–500)
Eosinophils Relative: 1 %
HCT: 36.7 % (ref 35.0–45.0)
Hemoglobin: 12.3 g/dL (ref 11.7–15.5)
LYMPHS PCT: 25 %
Lymphs Abs: 1325 cells/uL (ref 850–3900)
MCH: 33.9 pg — ABNORMAL HIGH (ref 27.0–33.0)
MCHC: 33.5 g/dL (ref 32.0–36.0)
MCV: 101.1 fL — ABNORMAL HIGH (ref 80.0–100.0)
MONOS PCT: 6 %
MPV: 8.7 fL (ref 7.5–12.5)
Monocytes Absolute: 318 cells/uL (ref 200–950)
NEUTROS ABS: 3604 {cells}/uL (ref 1500–7800)
Neutrophils Relative %: 68 %
PLATELETS: 268 10*3/uL (ref 140–400)
RBC: 3.63 MIL/uL — AB (ref 3.80–5.10)
RDW: 13.6 % (ref 11.0–15.0)
WBC: 5.3 10*3/uL (ref 3.8–10.8)

## 2017-01-30 LAB — POCT URINALYSIS DIP (DEVICE)
BILIRUBIN URINE: NEGATIVE
GLUCOSE, UA: NEGATIVE mg/dL
Ketones, ur: NEGATIVE mg/dL
NITRITE: NEGATIVE
Protein, ur: NEGATIVE mg/dL
Specific Gravity, Urine: 1.02 (ref 1.005–1.030)
Urobilinogen, UA: 0.2 mg/dL (ref 0.0–1.0)
pH: 6 (ref 5.0–8.0)

## 2017-01-30 MED ORDER — SULFAMETHOXAZOLE-TRIMETHOPRIM 800-160 MG PO TABS: 1.0000 | ORAL_TABLET | Freq: Two times a day (BID) | ORAL | 0 refills | Status: DC

## 2017-01-30 NOTE — Patient Instructions (Addendum)
Hypertension: Blood pressure is at goal on current medication regimen.    Right axillary abscess-  Will start Bactrim 800-160 mg every 12 hours for 10 days.  Apply warm, moist compresses to area Refrain from touching with hands.  Wash with dial antibacterial soap Leave area open to air   Skin Abscess A skin abscess is an infected area on or under your skin that contains pus and other material. An abscess can happen almost anywhere on your body. Some abscesses break open (rupture) on their own. Most continue to get worse unless they are treated. The infection can spread deeper into the body and into your blood, which can make you feel sick. Treatment usually involves draining the abscess. Follow these instructions at home: Abscess Care  If you have an abscess that has not drained, place a warm, clean, wet washcloth over the abscess several times a day. Do this as told by your doctor.  Follow instructions from your doctor about how to take care of your abscess. Make sure you: ? Cover the abscess with a bandage (dressing). ? Change your bandage or gauze as told by your doctor. ? Wash your hands with soap and water before you change the bandage or gauze. If you cannot use soap and water, use hand sanitizer.  Check your abscess every day for signs that the infection is getting worse. Check for: ? More redness, swelling, or pain. ? More fluid or blood. ? Warmth. ? More pus or a bad smell. Medicines   Take over-the-counter and prescription medicines only as told by your doctor.  If you were prescribed an antibiotic medicine, take it as told by your doctor. Do not stop taking the antibiotic even if you start to feel better. General instructions  To avoid spreading the infection: ? Do not share personal care items, towels, or hot tubs with others. ? Avoid making skin-to-skin contact with other people.  Keep all follow-up visits as told by your doctor. This is important. Contact a  doctor if:  You have more redness, swelling, or pain around your abscess.  You have more fluid or blood coming from your abscess.  Your abscess feels warm when you touch it.  You have more pus or a bad smell coming from your abscess.  You have a fever.  Your muscles ache.  You have chills.  You feel sick. Get help right away if:  You have very bad (severe) pain.  You see red streaks on your skin spreading away from the abscess. This information is not intended to replace advice given to you by your health care provider. Make sure you discuss any questions you have with your health care provider. Document Released: 12/28/2007 Document Revised: 03/06/2016 Document Reviewed: 05/20/2015 Elsevier Interactive Patient Education  [employer]2018 [employer]

## 2017-01-31 ENCOUNTER — Encounter (INDEPENDENT_AMBULATORY_CARE_PROVIDER_SITE_OTHER): Payer: Self-pay | Admitting: Physical Medicine and Rehabilitation

## 2017-01-31 ENCOUNTER — Ambulatory Visit (INDEPENDENT_AMBULATORY_CARE_PROVIDER_SITE_OTHER): Payer: Self-pay

## 2017-01-31 ENCOUNTER — Ambulatory Visit (INDEPENDENT_AMBULATORY_CARE_PROVIDER_SITE_OTHER): Payer: Self-pay | Admitting: Physical Medicine and Rehabilitation

## 2017-01-31 VITALS — BP 111/76 | HR 82

## 2017-01-31 DIAGNOSIS — M5412 Radiculopathy, cervical region: Secondary | ICD-10-CM

## 2017-01-31 MED ORDER — METHYLPREDNISOLONE ACETATE 80 MG/ML IJ SUSP
80.0000 mg | Freq: Once | INTRAMUSCULAR | Status: AC
  Administered 2017-01-31: 80 mg

## 2017-01-31 MED ORDER — LIDOCAINE HCL (PF) 1 % IJ SOLN
2.0000 mL | Freq: Once | INTRAMUSCULAR | Status: AC
  Administered 2017-01-31: 2 mL

## 2017-01-31 MED FILL — SULFAMETHOXAZOLE/TMP DS TAB: 800-160 | 10 days supply | Qty: 20 | Fill #0

## 2017-01-31 NOTE — Patient Instructions (Signed)

## 2017-01-31 NOTE — Progress Notes (Deleted)
Patient states she had around 65 % relief with last injection for 2 weeks and then gradual increase. Says she just feels pain in the left shoulder blade and not into her neck. Using crutches since mid June  and she thinks this has caused the increase in pain.

## 2017-02-01 LAB — URINE CULTURE
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
## 2017-02-02 NOTE — Procedures (Signed)
Cervical Epidural Steroid Injection - Interlaminar Approach with Fluoroscopic Guidance  Patient: [name]      Date of Birth: [birth year] MRN: [id number] PCP: Massie MaroonHollis, Lachina M, FNP      Visit Date: 01/31/2017   [name] is a 41 year old right-hand dominant female whom we saw several weeks ago completed left C7-T1 intralaminar epidural steroid injection. She really had failed all manner of conservative care and had been followed by Dr. Roda ShuttersXu in the office. Epidural injection gave her 65% relief of her neck and shoulder blade and arm pain for the first 2 weeks and she's had gradual return of symptoms. She still much better than she was. She states that most of the pain is in the shoulder blade at this point not so much the neck of the arm. We are repeat the injection one time. Unfortunately she is actually fractured her left wrist metatarsal since I've seen her and she's been followed by Dr. Ophelia CharterYates for that. He would be a good person as well to evaluate her cervical spine depending on how the injections are doing.  Universal Protocol:    Date/Time: 07/12/186:23 AM  Consent Given By: the patient  Position: PRONE  Additional Comments: Vital signs were monitored before and after the procedure. Patient was prepped and draped in the usual sterile fashion. The correct patient, procedure, and site was verified.   Injection Procedure Details:  Procedure Site One Meds Administered:  Meds ordered this encounter  Medications  . lidocaine (PF) (XYLOCAINE) 1 % injection 2 mL  . methylPREDNISolone acetate (DEPO-MEDROL) injection 80 mg     Laterality: Left  Location/Site:  C7-T1  Needle size: 20 G  Needle type: Touhy  Needle Placement: Paramedian epidural space  Findings:  -Contrast Used: 1 mL iohexol 180 mg iodine/mL   -Comments: Excellent flow of contrast into the epidural space.  Procedure Details: Using a paramedian approach from the side mentioned above, the region overlying  the inferior lamina was localized under fluoroscopic visualization and the soft tissues overlying this structure were infiltrated with 4 ml. of 1% Lidocaine without Epinephrine. A # 20 gauge, Tuohy needle was inserted into the epidural space using a paramedian approach.  The epidural space was localized using loss of resistance along with lateral and contralateral oblique bi-planar fluoroscopic views.  After negative aspirate for air, blood, and CSF, a 2 ml. volume of Isovue-250 was injected into the epidural space and the flow of contrast was observed. Radiographs were obtained for documentation purposes.   The injectate was administered into the level noted above.  Additional Comments:  The patient tolerated the procedure well No complications occurred Dressing: Band-Aid    Post-procedure details: Patient was observed during the procedure. Post-procedure instructions were reviewed.  Patient left the clinic in stable condition.

## 2017-02-07 ENCOUNTER — Other Ambulatory Visit: Payer: Self-pay | Admitting: Family Medicine

## 2017-02-07 MED FILL — IBUPROFEN 800 MG TABLET: 800 | 7 days supply | Qty: 21 | Fill #0

## 2017-02-14 ENCOUNTER — Ambulatory Visit: Payer: Self-pay | Attending: Internal Medicine

## 2017-02-22 ENCOUNTER — Ambulatory Visit (INDEPENDENT_AMBULATORY_CARE_PROVIDER_SITE_OTHER): Payer: Self-pay | Admitting: Orthopaedic Surgery

## 2017-02-22 ENCOUNTER — Encounter (INDEPENDENT_AMBULATORY_CARE_PROVIDER_SITE_OTHER): Payer: Self-pay | Admitting: Orthopaedic Surgery

## 2017-02-22 ENCOUNTER — Ambulatory Visit (INDEPENDENT_AMBULATORY_CARE_PROVIDER_SITE_OTHER): Payer: Self-pay

## 2017-02-22 VITALS — BP 115/74 | HR 70 | Ht 60.0 in | Wt 110.0 lb

## 2017-02-22 DIAGNOSIS — S92302D Fracture of unspecified metatarsal bone(s), left foot, subsequent encounter for fracture with routine healing: Secondary | ICD-10-CM

## 2017-02-22 MED FILL — AMLODIPINE BESYLATE 5 MG TA: 5 | 30 days supply | Qty: 30 | Fill #3

## 2017-02-22 NOTE — Progress Notes (Signed)
Office Visit Note   Patient: [name]           Date of Birth: 1976-02-14           MRN: [id number] Visit Date: 02/22/2017              Requested by: Massie MaroonHollis, Lachina M, FNP [address] [place], [place] [id number] PCP: Massie MaroonHollis, Lachina M, FNP   Assessment & Plan: Visit Diagnoses:  1. Closed fracture of shaft of metatarsal bone of left foot with routine healing, subsequent encounter     Plan: She can gradually work her way out of the cam boot into regular tennis shoe. She can work on some toe motion. She's not working and can gradually resume normal activities. Follow-up here will be on an as-needed basis.  Follow-Up Instructions: Return if symptoms worsen or fail to improve.   Orders:  Orders Placed This Encounter  Procedures  . XR Foot Complete Left   No orders of the defined types were placed in this encounter.     Procedures: No procedures performed   Clinical Data: No additional findings.   Subjective: Chief Complaint  Patient presents with  . Left Foot - Fracture, Follow-up    HPI 41 year old female returns post fifth metatarsal fracture. She's been in the boot she is full weightbearing she denies pain.  Review of Systems review of systems updated unchanged from last office visit. She's been compliant with her boot.   Objective: Vital Signs: BP 115/74   Pulse 70   Ht 5' (1.524 m)   Wt 110 lb (49.9 kg)   LMP 01/23/2017   BMI 21.48 kg/m   Physical Exam  Constitutional: She is oriented to person, place, and time. She appears well-developed.  HENT:  Head: Normocephalic.  Right Ear: External ear normal.  Left Ear: External ear normal.  Eyes: Pupils are equal, round, and reactive to light.  Neck: No tracheal deviation present. No thyromegaly present.  Cardiovascular: Normal rate.   Pulmonary/Chest: Effort normal.  Abdominal: Soft.  Musculoskeletal:  There is mild swelling of the forefoot. She has only slight tenderness at the  fracture site. Pulses are normal good capillary refill she has some toe range of motion returning.  Neurological: She is alert and oriented to person, place, and time.  Skin: Skin is warm and dry.  Psychiatric: She has a normal mood and affect. Her behavior is normal.    Ortho Exam  Specialty Comments:  No specialty comments available.  Imaging: Xr Foot Complete Left  Result Date: 02/22/2017 Review x-rays demonstrate no change in the metatarsal fracture shows slight interval healing. Impression: Stable left  long oblique spiral fifth metatarsal fracture     PMFS History: Patient Active Problem List   Diagnosis Date Noted  . Radiculopathy of cervicothoracic region 11/22/2016  . Essential hypertension 11/21/2016  . Injury of left shoulder 08/04/2016  . Muscle spasm of left shoulder 12/31/2015  . Left shoulder pain 12/31/2015  . Tobacco dependence 12/31/2015   Past Medical History:  Diagnosis Date  . Hypertension   . Left shoulder pain     Family History  Problem Relation Age of Onset  . Cancer Father     Past Surgical History:  Procedure Laterality Date  . CESAREAN SECTION    . TUBAL LIGATION     Social History   Occupational History  . Not on file.   Social History Main Topics  . Smoking status: Current Every Day Smoker  Packs/day: 1.00    Years: 15.00    Types: Cigarettes  . Smokeless tobacco: Never Used     Comment: down to a half pack  . Alcohol use No  . Drug use: No  . Sexual activity: Not on file

## 2017-03-24 ENCOUNTER — Emergency Department (HOSPITAL_COMMUNITY)
Admission: EM | Admit: 2017-03-24 | Discharge: 2017-03-24 | Disposition: A | Discharge status: Home / Self Care | Payer: Medicaid Other | Attending: Emergency Medicine | Admitting: Emergency Medicine

## 2017-03-24 ENCOUNTER — Encounter (HOSPITAL_COMMUNITY): Payer: Self-pay | Admitting: *Deleted

## 2017-03-24 DIAGNOSIS — F17210 Nicotine dependence, cigarettes, uncomplicated: Secondary | ICD-10-CM | POA: Diagnosis not present

## 2017-03-24 DIAGNOSIS — I10 Essential (primary) hypertension: Secondary | ICD-10-CM | POA: Insufficient documentation

## 2017-03-24 DIAGNOSIS — N12 Tubulo-interstitial nephritis, not specified as acute or chronic: Secondary | ICD-10-CM

## 2017-03-24 DIAGNOSIS — Z79899 Other long term (current) drug therapy: Secondary | ICD-10-CM | POA: Diagnosis not present

## 2017-03-24 DIAGNOSIS — N10 Acute tubulo-interstitial nephritis: Secondary | ICD-10-CM | POA: Diagnosis not present

## 2017-03-24 DIAGNOSIS — R350 Frequency of micturition: Secondary | ICD-10-CM | POA: Diagnosis present

## 2017-03-24 LAB — URINALYSIS, ROUTINE W REFLEX MICROSCOPIC
Glucose, UA: NEGATIVE mg/dL
Ketones, ur: 15 mg/dL — AB
NITRITE: NEGATIVE
PH: 6 (ref 5.0–8.0)
Protein, ur: 100 mg/dL — AB

## 2017-03-24 LAB — URINALYSIS, MICROSCOPIC (REFLEX)

## 2017-03-24 LAB — PREGNANCY, URINE: PREG TEST UR: NEGATIVE

## 2017-03-24 MED ORDER — CEPHALEXIN 500 MG PO CAPS: 500.0000 mg | ORAL_CAPSULE | Freq: Three times a day (TID) | ORAL | 0 refills | Status: DC

## 2017-03-24 MED ORDER — IBUPROFEN 400 MG PO TABS
600.0000 mg | ORAL_TABLET | Freq: Once | ORAL | Status: AC
  Administered 2017-03-24: 22:00:00 600 mg via ORAL
  Filled 2017-03-24: qty 1

## 2017-03-24 MED ORDER — CEPHALEXIN 250 MG PO CAPS
500.0000 mg | ORAL_CAPSULE | Freq: Once | ORAL | Status: AC
  Administered 2017-03-24: 500 mg via ORAL
  Filled 2017-03-24: qty 2

## 2017-03-24 NOTE — ED Notes (Signed)
ED Provider at bedside. 

## 2017-03-24 NOTE — ED Provider Notes (Signed)
[facility]-EMERGENCY DEPT Provider Note   CSN: [id number] Arrival date & time: 03/24/17  1521     History   Chief Complaint Chief Complaint  Patient presents with  . Urinary Frequency    HPI [name] is a 41 y.o. female.  HPI  41 year old female presents with concern for a kidney infection. She states over the last 4 days she's been having back pain. It started in her right flank, now today it is in her left flank only. She started having dysuria today as well. She is not having frequency or urgency. She seems to be urinating less. No blood in her urine. When the pain comes it is constant and lasts all day. There is no colic. No nausea, vomiting, or fevers. This feels similar to prior kidney infections. No vaginal bleeding or discharge. Last menstrual cycle was last week. Appropriate for the pain yesterday.  Past Medical History:  Diagnosis Date  . Hypertension   . Left shoulder pain     Patient Active Problem List   Diagnosis Date Noted  . Radiculopathy of cervicothoracic region 11/22/2016  . Essential hypertension 11/21/2016  . Injury of left shoulder 08/04/2016  . Muscle spasm of left shoulder 12/31/2015  . Left shoulder pain 12/31/2015  . Tobacco dependence 12/31/2015    Past Surgical History:  Procedure Laterality Date  . CESAREAN SECTION    . TUBAL LIGATION      OB History    No data available       Home Medications    Prior to Admission medications   Medication Sig Start Date End Date Taking? Authorizing Provider  amLODipine (NORVASC) 5 MG tablet Take 1 tablet (5 mg total) by mouth daily. 11/21/16  Yes Massie MaroonHollis, Lachina M, FNP  acetaminophen (TYLENOL) 500 MG tablet Take 1 tablet (500 mg total) by mouth every 6 (six) hours as needed. Patient not taking: Reported on 01/30/2017 01/08/17   Emi HolesLaw, Alexandra M, PA-C  cephALEXin (KEFLEX) 500 MG capsule Take 1 capsule (500 mg total) by mouth 3 (three) times daily. 03/24/17 04/03/17  Pricilla LovelessGoldston, Avyay Coger, MD  diazepam  (VALIUM) 5 MG tablet Take 1 by mouth 1 to 2 hours pre-procedure. May repeat if necessary. Patient not taking: Reported on 01/30/2017 01/16/17   Tyrell AntonioNewton, Frederic, MD  diclofenac (VOLTAREN) 75 MG EC tablet TAKE 1 TABLET BY MOUTH 2 TIMES DAILY Patient not taking: Reported on 01/30/2017 12/14/16   Massie MaroonHollis, Lachina M, FNP  DULoxetine (CYMBALTA) 20 MG capsule Take 1 capsule (20 mg total) by mouth daily. Patient not taking: Reported on 01/30/2017 12/21/16   Massie MaroonHollis, Lachina M, FNP  ibuprofen (ADVIL,MOTRIN) 800 MG tablet Take 1 tablet (800 mg total) by mouth 3 (three) times daily. Patient not taking: Reported on 03/24/2017 01/08/17   Emi HolesLaw, Alexandra M, PA-C  methocarbamol (ROBAXIN) 500 MG tablet TAKE 1 TABLET BY MOUTH 2 TIMES DAILY Patient not taking: Reported on 03/24/2017 12/14/16   Massie MaroonHollis, Lachina M, FNP  sulfamethoxazole-trimethoprim (BACTRIM DS,SEPTRA DS) 800-160 MG tablet Take 1 tablet by mouth 2 (two) times daily. Patient not taking: Reported on 03/24/2017 01/30/17   Massie MaroonHollis, Lachina M, FNP    Family History Family History  Problem Relation Age of Onset  . Cancer Father     Social History Social History  Substance Use Topics  . Smoking status: Current Every Day Smoker    Packs/day: 1.00    Years: 15.00    Types: Cigarettes  . Smokeless tobacco: Never Used     Comment: down to a  half pack  . Alcohol use No     Allergies   Patient has no known allergies.   Review of Systems Review of Systems  Constitutional: Negative for fever.  Gastrointestinal: Negative for abdominal pain, nausea and vomiting.  Genitourinary: Positive for dysuria and flank pain. Negative for frequency, urgency, vaginal bleeding and vaginal discharge.  All other systems reviewed and are negative.    Physical Exam Updated Vital Signs BP 112/85 (BP Location: Right Arm)   Pulse 73   Temp 98.1 F (36.7 C) (Oral)   Resp 16   Ht 5' (1.524 m)   Wt 6.35 kg (14 lb)   LMP 03/17/2017 (Approximate)   SpO2 100%   BMI 2.73 kg/m    Physical Exam  Constitutional: She is oriented to person, place, and time. She appears well-developed and well-nourished.  HENT:  Head: Normocephalic and atraumatic.  Right Ear: External ear normal.  Left Ear: External ear normal.  Nose: Nose normal.  Eyes: Right eye exhibits no discharge. Left eye exhibits no discharge.  Cardiovascular: Normal rate, regular rhythm and normal heart sounds.   Pulmonary/Chest: Effort normal and breath sounds normal.  Abdominal: Soft. There is tenderness in the suprapubic area. There is CVA tenderness (left).  Neurological: She is alert and oriented to person, place, and time.  Skin: Skin is warm and dry.  Nursing note and vitals reviewed.    ED Treatments / Results  Labs (all labs ordered are listed, but only abnormal results are displayed) Labs Reviewed  URINALYSIS, ROUTINE W REFLEX MICROSCOPIC - Abnormal; Notable for the following:       Result Value   Color, Urine AMBER (*)    APPearance HAZY (*)    Specific Gravity, Urine >1.030 (*)    Hgb urine dipstick MODERATE (*)    Bilirubin Urine MODERATE (*)    Ketones, ur 15 (*)    Protein, ur 100 (*)    Leukocytes, UA SMALL (*)    All other components within normal limits  URINALYSIS, MICROSCOPIC (REFLEX) - Abnormal; Notable for the following:    Bacteria, UA MANY (*)    Squamous Epithelial / LPF 6-30 (*)    All other components within normal limits  URINE CULTURE  PREGNANCY, URINE    EKG  EKG Interpretation None       Radiology No results found.  Procedures Procedures (including critical care time)  Medications Ordered in ED Medications  cephALEXin (KEFLEX) capsule 500 mg (500 mg Oral Given 03/24/17 2217)  ibuprofen (ADVIL,MOTRIN) tablet 600 mg (600 mg Oral Given 03/24/17 2217)     Initial Impression / Assessment and Plan / ED Course  I have reviewed the triage vital signs and the nursing notes.  Pertinent labs & imaging results that were available during my care of the  patient were reviewed by me and considered in my medical decision making (see chart for details).     No vaginal complaints. Given flank pain and urinary symptoms, likely this is pyelo. However no vomiting, severe pain or fever. Stable for outpatient treatment. While currently she has unilateral pain, she had pain on the other side earlier. Thus I doubt renal stone. Ibuprofen, tylenol, and antibiotics. Has f/u with PCP next week already. Discussed return precautions.   Final Clinical Impressions(s) / ED Diagnoses   Final diagnoses:  Pyelonephritis    New Prescriptions Discharge Medication List as of 03/24/2017 11:04 PM    START taking these medications   Details  cephALEXin (KEFLEX) 500 MG capsule  Take 1 capsule (500 mg total) by mouth 3 (three) times daily., Starting Fri 03/24/2017, Until Mon 04/03/2017, Print         Pricilla Loveless, MD 03/25/17 [phone]

## 2017-03-24 NOTE — ED Triage Notes (Signed)
Pt c/o bil lower back & bil suprapubic pain onset x 3 days, pt c/o urinary frequency, pt reports dysuria, denies hematuria & n/v/d, A&O x4

## 2017-03-27 LAB — URINE CULTURE

## 2017-03-28 ENCOUNTER — Telehealth: Payer: Self-pay | Admitting: Emergency Medicine

## 2017-03-28 NOTE — Progress Notes (Signed)
ED Antimicrobial Stewardship Positive Culture Follow Up  [name] is an 41 y.o. female [facility] with a chief complaint of  Chief Complaint  Patient presents with  . Urinary Frequency   ? Recent Results (from the past 720 hour(s))  Urine culture     Status: Abnormal   Collection Time: 03/24/17  3:50 PM  Result Value Ref Range Status   Specimen Description URINE, RANDOM  Final   Special Requests NONE  Final   Culture >=100,000 COLONIES/mL ENTEROBACTER AEROGENES (A)  Final   Report Status 03/27/2017 FINAL  Final   Organism ID, Bacteria ENTEROBACTER AEROGENES (A)  Final      Susceptibility   Enterobacter aerogenes - MIC*    CEFAZOLIN >=64 RESISTANT Resistant     CEFTRIAXONE <=1 SENSITIVE Sensitive     CIPROFLOXACIN <=0.25 SENSITIVE Sensitive     GENTAMICIN <=1 SENSITIVE Sensitive     IMIPENEM 2 SENSITIVE Sensitive     NITROFURANTOIN 64 INTERMEDIATE Intermediate     TRIMETH/SULFA <=20 SENSITIVE Sensitive     PIP/TAZO <=4 SENSITIVE Sensitive     * >=100,000 COLONIES/mL ENTEROBACTER AEROGENES   ? Treated with cephalexin , organism resistant to prescribed antimicrobial ? New antibiotic prescription: Bactrim 1 DS tablet BID for 7 days ? ED Provider: Cheron SchaumannLeslie Sofia PA-C ? Sheron NightingaleJames A Ryaan Vanwagoner 03/28/2017, 10:21 AM Infectious Diseases Pharmacist Phone# [phone]

## 2017-03-28 NOTE — Telephone Encounter (Signed)
Post ED Visit - Positive Culture Follow-up: Successful Patient Follow-Up  Culture assessed and recommendations reviewed by: []  Enzo BiNathan Batchelder, Pharm.D. []  Celedonio MiyamotoJeremy Frens, Pharm.D., BCPS AQ-ID []  Garvin FilaMike Maccia, Pharm.D., BCPS []  Georgina PillionElizabeth Martin, Pharm.D., BCPS []  RainsvilleMinh Pham, [address]Pharm.D., BCPS, AAHIVP []  Estella HuskMichelle Turner, Pharm.D., BCPS, AAHIVP []  Lysle Pearlachel Rumbarger, PharmD, BCPS []  Casilda Carlsaylor Stone, PharmD, BCPS [x]  Pollyann SamplesAndy Johnston, PharmD, BCPS  Positive urine culture  []  Patient discharged without antimicrobial prescription and treatment is now indicated [x]  Organism is resistant to prescribed ED discharge antimicrobial []  Patient with positive blood cultures  Changes discussed with ED provider: Ok EdwardsLeslie Karen Sofia PA New antibiotic prescription stop Cephalexin, start Bactrim DS po 1 bid x 7 days [facility] [address]  Contacted patient, 03/28/2017 1339   Suzanne MullMiller, [name] 03/28/2017, 1:37 PM

## 2017-03-30 ENCOUNTER — Ambulatory Visit (INDEPENDENT_AMBULATORY_CARE_PROVIDER_SITE_OTHER): Payer: Medicaid Other | Admitting: Family Medicine

## 2017-03-30 ENCOUNTER — Encounter: Payer: Self-pay | Admitting: Family Medicine

## 2017-03-30 VITALS — BP 118/86 | HR 67 | Temp 98.3°F | Resp 14 | Ht 60.0 in | Wt 112.0 lb

## 2017-03-30 DIAGNOSIS — F17200 Nicotine dependence, unspecified, uncomplicated: Secondary | ICD-10-CM | POA: Diagnosis not present

## 2017-03-30 DIAGNOSIS — I10 Essential (primary) hypertension: Secondary | ICD-10-CM

## 2017-03-30 LAB — BASIC METABOLIC PANEL WITH GFR
BUN: 12 mg/dL (ref 7–25)
CALCIUM: 8.5 mg/dL — AB (ref 8.6–10.2)
CHLORIDE: 106 mmol/L (ref 98–110)
CO2: 26 mmol/L (ref 20–32)
CREATININE: 1 mg/dL (ref 0.50–1.10)
GFR, Est African American: 81 mL/min/{1.73_m2} (ref >=60)
GFR, Est Non African American: 70 mL/min/{1.73_m2} (ref >=60)
Glucose, Bld: 84 mg/dL (ref 65–99)
Potassium: 3.7 mmol/L (ref 3.5–5.3)
SODIUM: 137 mmol/L (ref 135–146)

## 2017-03-30 NOTE — Patient Instructions (Signed)
Blood pressure is at goal, no changes warranted today.  DASH Eating Plan DASH stands for "Dietary Approaches to Stop Hypertension." The DASH eating plan is a healthy eating plan that has been shown to reduce high blood pressure (hypertension). It may also reduce your risk for type 2 diabetes, heart disease, and stroke. The DASH eating plan may also help with weight loss. What are tips for following this plan? General guidelines  Avoid eating more than 2,300 mg (milligrams) of salt (sodium) a day. If you have hypertension, you may need to reduce your sodium intake to 1,500 mg a day.  Limit alcohol intake to no more than 1 drink a day for nonpregnant women and 2 drinks a day for men. One drink equals 12 oz of beer, 5 oz of wine, or 1 oz of hard liquor.  Work with your health care provider to maintain a healthy body weight or to lose weight. Ask what an ideal weight is for you.  Get at least 30 minutes of exercise that causes your heart to beat faster (aerobic exercise) most days of the week. Activities may include walking, swimming, or biking.  Work with your health care provider or diet and nutrition specialist (dietitian) to adjust your eating plan to your individual calorie needs. Reading food labels  Check food labels for the amount of sodium per serving. Choose foods with less than 5 percent of the Daily Value of sodium. Generally, foods with less than 300 mg of sodium per serving fit into this eating plan.  To find whole grains, look for the word "whole" as the first word in the ingredient list. Shopping  Buy products labeled as "low-sodium" or "no salt added."  Buy fresh foods. Avoid canned foods and premade or frozen meals. Cooking  Avoid adding salt when cooking. Use salt-free seasonings or herbs instead of table salt or sea salt. Check with your health care provider or pharmacist before using salt substitutes.  Do not fry foods. Cook foods using healthy methods such as baking,  boiling, grilling, and broiling instead.  Cook with heart-healthy oils, such as olive, canola, soybean, or sunflower oil. Meal planning   Eat a balanced diet that includes: ? 5 or more servings of fruits and vegetables each day. At each meal, try to fill half of your plate with fruits and vegetables. ? Up to 6-8 servings of whole grains each day. ? Less than 6 oz of lean meat, poultry, or fish each day. A 3-oz serving of meat is about the same size as a deck of cards. One egg equals 1 oz. ? 2 servings of low-fat dairy each day. ? A serving of nuts, seeds, or beans 5 times each week. ? Heart-healthy fats. Healthy fats called Omega-3 fatty acids are found in foods such as flaxseeds and coldwater fish, like sardines, salmon, and mackerel.  Limit how much you eat of the following: ? Canned or prepackaged foods. ? Food that is high in trans fat, such as fried foods. ? Food that is high in saturated fat, such as fatty meat. ? Sweets, desserts, sugary drinks, and other foods with added sugar. ? Full-fat dairy products.  Do not salt foods before eating.  Try to eat at least 2 vegetarian meals each week.  Eat more home-cooked food and less restaurant, buffet, and fast food.  When eating at a restaurant, ask that your food be prepared with less salt or no salt, if possible. What foods are recommended? The items listed may not  be a complete list. Talk with your dietitian about what dietary choices are best for you. Grains Whole-grain or whole-wheat bread. Whole-grain or whole-wheat pasta. Brown rice. [place] Morrow. Bulgur. Whole-grain and low-sodium cereals. Pita bread. Low-fat, low-sodium crackers. Whole-wheat flour tortillas. Vegetables Fresh or frozen vegetables (raw, steamed, roasted, or grilled). Low-sodium or reduced-sodium tomato and vegetable juice. Low-sodium or reduced-sodium tomato sauce and tomato paste. Low-sodium or reduced-sodium canned vegetables. Fruits All fresh, dried, or  frozen fruit. Canned fruit in natural juice (without added sugar). Meat and other protein foods Skinless chicken or [place]. Ground chicken or [place]. Pork with fat trimmed off. Fish and seafood. Egg whites. Dried beans, peas, or lentils. Unsalted nuts, nut butters, and seeds. Unsalted canned beans. Lean cuts of beef with fat trimmed off. Low-sodium, lean deli meat. Dairy Low-fat (1%) or fat-free (skim) milk. Fat-free, low-fat, or reduced-fat cheeses. Nonfat, low-sodium ricotta or cottage cheese. Low-fat or nonfat yogurt. Low-fat, low-sodium cheese. Fats and oils Soft margarine without trans fats. Vegetable oil. Low-fat, reduced-fat, or light mayonnaise and salad dressings (reduced-sodium). Canola, safflower, olive, soybean, and sunflower oils. Avocado. Seasoning and other foods Herbs. Spices. Seasoning mixes without salt. Unsalted popcorn and pretzels. Fat-free sweets. What foods are not recommended? The items listed may not be a complete list. Talk with your dietitian about what dietary choices are best for you. Grains Baked goods made with fat, such as croissants, muffins, or some breads. Dry pasta or rice meal packs. Vegetables Creamed or fried vegetables. Vegetables in a cheese sauce. Regular canned vegetables (not low-sodium or reduced-sodium). Regular canned tomato sauce and paste (not low-sodium or reduced-sodium). Regular tomato and vegetable juice (not low-sodium or reduced-sodium). Angie Fava. Olives. Fruits Canned fruit in a light or heavy syrup. Fried fruit. Fruit in cream or butter sauce. Meat and other protein foods Fatty cuts of meat. Ribs. Fried meat. Berniece Salines. Sausage. Bologna and other processed lunch meats. Salami. Fatback. Hotdogs. Bratwurst. Salted nuts and seeds. Canned beans with added salt. Canned or smoked fish. Whole eggs or egg yolks. Chicken or [place] with skin. Dairy Whole or 2% milk, cream, and half-and-half. Whole or full-fat cream cheese. Whole-fat or sweetened yogurt.  Full-fat cheese. Nondairy creamers. Whipped toppings. Processed cheese and cheese spreads. Fats and oils Butter. Stick margarine. Lard. Shortening. Ghee. Bacon fat. Tropical oils, such as coconut, palm kernel, or palm oil. Seasoning and other foods Salted popcorn and pretzels. Onion salt, garlic salt, seasoned salt, table salt, and sea salt. Worcestershire sauce. Tartar sauce. Barbecue sauce. Teriyaki sauce. Soy sauce, including reduced-sodium. Steak sauce. Canned and packaged gravies. Fish sauce. Oyster sauce. Cocktail sauce. Horseradish that you find on the shelf. Ketchup. Mustard. Meat flavorings and tenderizers. Bouillon cubes. Hot sauce and Tabasco sauce. Premade or packaged marinades. Premade or packaged taco seasonings. Relishes. Regular salad dressings. Where to find more information:  National Heart, Lung, and [place]: https://wilson-eaton.com/  American Heart Association: www.heart.org Summary  The DASH eating plan is a healthy eating plan that has been shown to reduce high blood pressure (hypertension). It may also reduce your risk for type 2 diabetes, heart disease, and stroke.  With the DASH eating plan, you should limit salt (sodium) intake to 2,300 mg a day. If you have hypertension, you may need to reduce your sodium intake to 1,500 mg a day.  When on the DASH eating plan, aim to eat more fresh fruits and vegetables, whole grains, lean proteins, low-fat dairy, and heart-healthy fats.  Work with your health care provider or diet and nutrition  specialist (dietitian) to adjust your eating plan to your individual calorie needs. This information is not intended to replace advice given to you by your health care provider. Make sure you discuss any questions you have with your health care provider. Document Released: 06/30/2011 Document Revised: 07/04/2016 Document Reviewed: 07/04/2016 Elsevier Interactive Patient Education  2017 [employer].

## 2017-03-30 NOTE — Progress Notes (Signed)
[name], a 41 year old female with a history of hypertension presents for follow up. She says that she has been following a low sodium diet and taking medication consistently. She does not exercise routinely.    Hypertension  This is a chronic problem. The problem is controlled. Pertinent negatives include no anxiety, blurred vision, chest pain, headaches, malaise/fatigue, neck pain, orthopnea, palpitations, peripheral edema, PND, shortness of breath or sweats. Risk factors for coronary artery disease include smoking/tobacco exposure. Compliance problems include exercise.  There is no history of a hypertension causing med, pheochromocytoma, sleep apnea or a thyroid problem.   Past Medical History:  Diagnosis Date  . Hypertension   . Left shoulder pain    Social History   Social History  . Marital status: Single    Spouse name: N/A  . Number of children: N/A  . Years of education: N/A   Occupational History  . Not on file.   Social History Main Topics  . Smoking status: Current Every Day Smoker    Packs/day: 1.00    Years: 15.00    Types: Cigarettes  . Smokeless tobacco: Never Used     Comment: down to a half pack  . Alcohol use No  . Drug use: No  . Sexual activity: Not on file   Other Topics Concern  . Not on file   Social History Narrative  . No narrative on file   Immunization History  Administered Date(s) Administered  . Tdap 08/04/2016    Review of Systems  Constitutional: Negative.  Negative for malaise/fatigue.  Eyes: Negative for blurred vision.  Respiratory: Negative for shortness of breath.   Cardiovascular: Negative.  Negative for chest pain, palpitations, orthopnea and PND.  Gastrointestinal: Negative.   Genitourinary: Negative.   Musculoskeletal: Negative.  Negative for neck pain.  Skin: Negative.   Neurological: Negative for headaches.  Endo/Heme/Allergies: Negative.   Psychiatric/Behavioral: Negative.   Physical Exam  Constitutional: She  is oriented to person, place, and time and well-developed, well-nourished, and in no distress.  HENT:  Head: Normocephalic and atraumatic.  Right Ear: External ear normal.  Left Ear: External ear normal.  Nose: Nose normal.  Mouth/Throat: Oropharynx is clear and moist.  Eyes: Pupils are equal, round, and reactive to light. Conjunctivae and EOM are normal.  Neck: Normal range of motion. Neck supple.  Cardiovascular: Normal rate, regular rhythm, normal heart sounds and intact distal pulses.   Pulmonary/Chest: Effort normal and breath sounds normal.  Abdominal: Soft. Bowel sounds are normal.  Musculoskeletal: Normal range of motion.  Neurological: She is alert and oriented to person, place, and time.  Skin: Skin is warm and dry.  Psychiatric: Mood, memory, affect and judgment normal.   Plan  BP 118/86 (BP Location: Right Arm, Patient Position: Sitting, Cuff Size: Normal)   Pulse 67   Temp 98.3 F (36.8 C) (Oral)   Resp 14   Ht 5' (1.524 m)   Wt 112 lb (50.8 kg)   LMP 03/17/2017 (Approximate)   SpO2 100%   BMI 21.87 kg/m   1. Essential hypertension Blood pressure is at goal, no medication changes warranted. - Continue medication, monitor blood pressure at home. Continue DASH diet. Reminder to go to the ER if any CP, SOB, nausea, dizziness, severe HA, changes vision/speech, left arm numbness and tingling and jaw pain.  Will follow up by phone with any abnormal laboratory results  - BASIC METABOLIC PANEL WITH GFR  2. Tobacco dependence Smoking cessation instruction/counseling given:  counseled  patient on the dangers of tobacco use, advised patient to stop smoking, and reviewed strategies to maximize success RTC: 3 months for hypertension  Nolon Nations  MSN, FNP-C [facility] [address] [place], [place] [address] [phone]    The patient was given clear instructions to go to ER or return to medical center if symptoms do not  improve, worsen or new problems develop. The patient verbalized understanding.

## 2017-04-04 MED FILL — AMLODIPINE BESYLATE 5 MG TA: 5 | 30 days supply | Qty: 30 | Fill #4

## 2017-04-12 ENCOUNTER — Encounter: Payer: Self-pay | Admitting: Family Medicine

## 2017-06-08 MED FILL — AMLODIPINE BESYLATE 5 MG TA: 5 | 30 days supply | Qty: 30 | Fill #5

## 2017-06-29 ENCOUNTER — Ambulatory Visit: Payer: Medicaid Other | Admitting: Family Medicine

## 2017-07-05 ENCOUNTER — Ambulatory Visit: Payer: Medicaid Other | Admitting: Family Medicine

## 2017-07-12 ENCOUNTER — Ambulatory Visit: Payer: Medicaid Other | Admitting: Family Medicine

## 2017-08-21 MED FILL — METHOCARBAMOL 500 MG TABLET: 500 | 30 days supply | Qty: 60 | Fill #1

## 2017-09-12 ENCOUNTER — Telehealth: Payer: Self-pay

## 2017-09-12 NOTE — Telephone Encounter (Signed)
Patient called and wanted to make an appointment for high blood pressure. She was complaining of tingling in left hand and fingers. I advised her to go to the ER asap for evaluation. I did scheduled an appointment for follow up on Thursday 09/14/2017 but again advised her to go to the ER immediately. Patient verbalized understanding. Thanks!

## 2017-09-14 ENCOUNTER — Encounter: Payer: Self-pay | Admitting: Family Medicine

## 2017-09-14 ENCOUNTER — Ambulatory Visit (INDEPENDENT_AMBULATORY_CARE_PROVIDER_SITE_OTHER): Payer: Medicaid Other | Admitting: Family Medicine

## 2017-09-14 VITALS — BP 138/78 | HR 74 | Temp 98.3°F | Resp 16 | Ht 60.0 in | Wt 113.0 lb

## 2017-09-14 DIAGNOSIS — I10 Essential (primary) hypertension: Secondary | ICD-10-CM

## 2017-09-14 DIAGNOSIS — F17200 Nicotine dependence, unspecified, uncomplicated: Secondary | ICD-10-CM

## 2017-09-14 LAB — POCT URINALYSIS DIP (DEVICE)
Bilirubin Urine: NEGATIVE
Glucose, UA: NEGATIVE mg/dL
Ketones, ur: NEGATIVE mg/dL
LEUKOCYTES UA: NEGATIVE
NITRITE: NEGATIVE
PH: 7 (ref 5.0–8.0)
Protein, ur: NEGATIVE mg/dL
Specific Gravity, Urine: 1.025 (ref 1.005–1.030)
UROBILINOGEN UA: 1 mg/dL (ref 0.0–1.0)

## 2017-09-14 MED ORDER — AMLODIPINE BESYLATE 5 MG PO TABS: 5.0000 mg | ORAL_TABLET | Freq: Every day | ORAL | 5 refills | Status: DC

## 2017-09-14 MED FILL — ?AMLODIPINE BESYLATE 5 MG T: 5 MG | 30 days supply | Qty: 30 | Fill #0

## 2017-09-14 NOTE — Patient Instructions (Signed)
Will restart Amlodipine 5 mg daily for blood pressure.  - Continue medication, monitor blood pressure at home. Continue DASH diet. Reminder to go to the ER if any CP, SOB, nausea, dizziness, severe HA, changes vision/speech, left arm numbness and tingling and jaw pain.   DASH Eating Plan DASH stands for "Dietary Approaches to Stop Hypertension." The DASH eating plan is a healthy eating plan that has been shown to reduce high blood pressure (hypertension). It may also reduce your risk for type 2 diabetes, heart disease, and stroke. The DASH eating plan may also help with weight loss. What are tips for following this plan? General guidelines  Avoid eating more than 2,300 mg (milligrams) of salt (sodium) a day. If you have hypertension, you may need to reduce your sodium intake to 1,500 mg a day.  Limit alcohol intake to no more than 1 drink a day for nonpregnant women and 2 drinks a day for men. One drink equals 12 oz of beer, 5 oz of wine, or 1 oz of hard liquor.  Work with your health care provider to maintain a healthy body weight or to lose weight. Ask what an ideal weight is for you.  Get at least 30 minutes of exercise that causes your heart to beat faster (aerobic exercise) most days of the week. Activities may include walking, swimming, or biking.  Work with your health care provider or diet and nutrition specialist (dietitian) to adjust your eating plan to your individual calorie needs. Reading food labels  Check food labels for the amount of sodium per serving. Choose foods with less than 5 percent of the Daily Value of sodium. Generally, foods with less than 300 mg of sodium per serving fit into this eating plan.  To find whole grains, look for the word "whole" as the first word in the ingredient list. Shopping  Buy products labeled as "low-sodium" or "no salt added."  Buy fresh foods. Avoid canned foods and premade or frozen meals. Cooking  Avoid adding salt when cooking.  Use salt-free seasonings or herbs instead of table salt or sea salt. Check with your health care provider or pharmacist before using salt substitutes.  Do not fry foods. Cook foods using healthy methods such as baking, boiling, grilling, and broiling instead.  Cook with heart-healthy oils, such as olive, canola, soybean, or sunflower oil. Meal planning   Eat a balanced diet that includes: ? 5 or more servings of fruits and vegetables each day. At each meal, try to fill half of your plate with fruits and vegetables. ? Up to 6-8 servings of whole grains each day. ? Less than 6 oz of lean meat, poultry, or fish each day. A 3-oz serving of meat is about the same size as a deck of cards. One egg equals 1 oz. ? 2 servings of low-fat dairy each day. ? A serving of nuts, seeds, or beans 5 times each week. ? Heart-healthy fats. Healthy fats called Omega-3 fatty acids are found in foods such as flaxseeds and coldwater fish, like sardines, salmon, and mackerel.  Limit how much you eat of the following: ? Canned or prepackaged foods. ? Food that is high in trans fat, such as fried foods. ? Food that is high in saturated fat, such as fatty meat. ? Sweets, desserts, sugary drinks, and other foods with added sugar. ? Full-fat dairy products.  Do not salt foods before eating.  Try to eat at least 2 vegetarian meals each week.  Eat more home-cooked  food and less restaurant, buffet, and fast food.  When eating at a restaurant, ask that your food be prepared with less salt or no salt, if possible. What foods are recommended? The items listed may not be a complete list. Talk with your dietitian about what dietary choices are best for you. Grains Whole-grain or whole-wheat bread. Whole-grain or whole-wheat pasta. Brown rice. Orpah Cobb. Bulgur. Whole-grain and low-sodium cereals. Pita bread. Low-fat, low-sodium crackers. Whole-wheat flour tortillas. Vegetables Fresh or frozen vegetables (raw,  steamed, roasted, or grilled). Low-sodium or reduced-sodium tomato and vegetable juice. Low-sodium or reduced-sodium tomato sauce and tomato paste. Low-sodium or reduced-sodium canned vegetables. Fruits All fresh, dried, or frozen fruit. Canned fruit in natural juice (without added sugar). Meat and other protein foods Skinless chicken or [place]. Ground chicken or [place]. Pork with fat trimmed off. Fish and seafood. Egg whites. Dried beans, peas, or lentils. Unsalted nuts, nut butters, and seeds. Unsalted canned beans. Lean cuts of beef with fat trimmed off. Low-sodium, lean deli meat. Dairy Low-fat (1%) or fat-free (skim) milk. Fat-free, low-fat, or reduced-fat cheeses. Nonfat, low-sodium ricotta or cottage cheese. Low-fat or nonfat yogurt. Low-fat, low-sodium cheese. Fats and oils Soft margarine without trans fats. Vegetable oil. Low-fat, reduced-fat, or light mayonnaise and salad dressings (reduced-sodium). Canola, safflower, olive, soybean, and sunflower oils. Avocado. Seasoning and other foods Herbs. Spices. Seasoning mixes without salt. Unsalted popcorn and pretzels. Fat-free sweets. What foods are not recommended? The items listed may not be a complete list. Talk with your dietitian about what dietary choices are best for you. Grains Baked goods made with fat, such as croissants, muffins, or some breads. Dry pasta or rice meal packs. Vegetables Creamed or fried vegetables. Vegetables in a cheese sauce. Regular canned vegetables (not low-sodium or reduced-sodium). Regular canned tomato sauce and paste (not low-sodium or reduced-sodium). Regular tomato and vegetable juice (not low-sodium or reduced-sodium). Rosita Fire. Olives. Fruits Canned fruit in a light or heavy syrup. Fried fruit. Fruit in cream or butter sauce. Meat and other protein foods Fatty cuts of meat. Ribs. Fried meat. Tomasa Blase. Sausage. Bologna and other processed lunch meats. Salami. Fatback. Hotdogs. Bratwurst. Salted nuts and  seeds. Canned beans with added salt. Canned or smoked fish. Whole eggs or egg yolks. Chicken or [place] with skin. Dairy Whole or 2% milk, cream, and half-and-half. Whole or full-fat cream cheese. Whole-fat or sweetened yogurt. Full-fat cheese. Nondairy creamers. Whipped toppings. Processed cheese and cheese spreads. Fats and oils Butter. Stick margarine. Lard. Shortening. Ghee. Bacon fat. Tropical oils, such as coconut, palm kernel, or palm oil. Seasoning and other foods Salted popcorn and pretzels. Onion salt, garlic salt, seasoned salt, table salt, and sea salt. Worcestershire sauce. Tartar sauce. Barbecue sauce. Teriyaki sauce. Soy sauce, including reduced-sodium. Steak sauce. Canned and packaged gravies. Fish sauce. Oyster sauce. Cocktail sauce. Horseradish that you find on the shelf. Ketchup. Mustard. Meat flavorings and tenderizers. Bouillon cubes. Hot sauce and Tabasco sauce. Premade or packaged marinades. Premade or packaged taco seasonings. Relishes. Regular salad dressings. Where to find more information:  National Heart, Lung, and Blood Institute: PopSteam.is  American Heart Association: www.heart.org Summary  The DASH eating plan is a healthy eating plan that has been shown to reduce high blood pressure (hypertension). It may also reduce your risk for type 2 diabetes, heart disease, and stroke.  With the DASH eating plan, you should limit salt (sodium) intake to 2,300 mg a day. If you have hypertension, you may need to reduce your sodium intake to 1,500 mg  a day.  When on the DASH eating plan, aim to eat more fresh fruits and vegetables, whole grains, lean proteins, low-fat dairy, and heart-healthy fats.  Work with your health care provider or diet and nutrition specialist (dietitian) to adjust your eating plan to your individual calorie needs. This information is not intended to replace advice given to you by your health care provider. Make sure you discuss any questions you  have with your health care provider. Document Released: 06/30/2011 Document Revised: 07/04/2016 Document Reviewed: 07/04/2016 Elsevier Interactive Patient Education  [employer].

## 2017-09-14 NOTE — Progress Notes (Signed)
[name], a 42 year old female with a history  Patient here for follow-up of elevated blood pressure. [name] says that [name] has been out of antihypertensive medications over the past several weeks. 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 . [name] is not exercising and is not adherent to low salt diet.  Blood pressure is not well controlled at home.  Patient denies chest pain, chest pressure/discomfort, dyspnea, irregular heart beat, palpitations, syncope and tachypnea.  Cardiovascular risk factors: sedentary lifestyle and smoking/ tobacco exposure.  Past Medical History:  Diagnosis Date  . Hypertension   . Left shoulder pain    Social History   Socioeconomic History  . Marital status: Single    Spouse name: Not on file  . Number of children: Not on file  . Years of education: Not on file  . Highest education level: Not on file  Social Needs  . Financial resource strain: Not on file  . Food insecurity - worry: Not on file  . Food insecurity - inability: Not on file  . Transportation needs - medical: Not on file  . Transportation needs - non-medical: Not on file  Occupational History  . Not on file  Tobacco Use  . Smoking status: Current Every Day Smoker    Packs/day: 1.00    Years: 15.00    Pack years: 15.00    Types: Cigarettes  . Smokeless tobacco: Never Used  . Tobacco comment: down to a half pack  Substance and Sexual Activity  . Alcohol use: No  . Drug use: No  . Sexual activity: Not on file  Other Topics Concern  . Not on file  Social History Narrative  . Not on file   Immunization History  Administered Date(s) Administered  . Tdap 08/04/2016    Review of Systems  Constitutional: Negative.   HENT: Negative.   Eyes: Negative.   Respiratory: Negative.   Cardiovascular: Negative.   Gastrointestinal: Negative.   Genitourinary: Negative.   Musculoskeletal: Negative.   Skin: Negative.   Neurological: Negative.   Endo/Heme/Allergies: Negative.    Psychiatric/Behavioral: Negative.   Physical Exam  Constitutional: [name] appears well-developed and well-nourished.  HENT:  Head: Normocephalic.  Nose: Nose normal.  Eyes: Pupils are equal, round, and reactive to light.  Neck: Normal range of motion.  Cardiovascular: Normal rate and regular rhythm.  Pulmonary/Chest: Effort normal and breath sounds normal.  Abdominal: Soft. Bowel sounds are normal.   Plan  1. Essential hypertension Will restart amlodipine at 5 mg daily.  Continue medication, monitor blood pressure at home. Continue DASH diet. Reminder to go to the ER if any CP, SOB, nausea, dizziness, severe HA, changes vision/speech, left arm numbness and tingling and jaw pain.   - amLODipine (NORVASC) 5 MG tablet; Take 1 tablet (5 mg total) by mouth daily.  Dispense: 30 tablet; Refill: 5 - Basic Metabolic Panel  2. Tobacco dependence Smoking cessation instruction/counseling given:  counseled patient on the dangers of tobacco use, advised patient to stop smoking, and reviewed strategies to maximize success    RTC: 6 months for hypertension   Nolon NationsLachina Moore Briele Lagasse  MSN, FNP-C Patient Care [facility] [address]  [place], [place] [id number] [phone]

## 2017-09-15 ENCOUNTER — Telehealth: Payer: Self-pay

## 2017-09-15 ENCOUNTER — Ambulatory Visit: Payer: Medicaid Other | Admitting: Family Medicine

## 2017-09-15 LAB — BASIC METABOLIC PANEL
BUN / CREAT RATIO: 18 (ref 9–23)
BUN: 19 mg/dL (ref 6–24)
CO2: 22 mmol/L (ref 20–29)
CREATININE: 1.08 mg/dL — AB (ref 0.57–1.00)
Calcium: 8.6 mg/dL — ABNORMAL LOW (ref 8.7–10.2)
Chloride: 103 mmol/L (ref 96–106)
GFR calc Af Amer: 74 mL/min/{1.73_m2} (ref >59)
GFR calc non Af Amer: 64 mL/min/{1.73_m2} (ref >59)
Glucose: 93 mg/dL (ref 65–99)
Potassium: 4 mmol/L (ref 3.5–5.2)
SODIUM: 139 mmol/L (ref 134–144)

## 2017-09-15 NOTE — Telephone Encounter (Signed)
Called and spoke with patient, advised that creatinine level was mildly elevated and that it is important that she take blood pressure medication every day as prescribed to achieve positive outcomes. Advised that she should keep next scheduled appointment. Thanks!

## 2017-09-15 NOTE — Telephone Encounter (Signed)
-----   Message from Massie MaroonLachina M Hollis, [place]NP sent at 09/15/2017  5:47 AM EST ----- Regarding: lab results Please inform patient that creatinine level, an indicator of kidney functioning is mildly elevated. Remind her of the importance of adhering to blood pressure medication regimen in order to achieve positive outcomes.   Follow up as scheduled   Thanks

## 2017-10-05 ENCOUNTER — Emergency Department (HOSPITAL_COMMUNITY): Payer: Medicaid Other

## 2017-10-05 ENCOUNTER — Emergency Department (HOSPITAL_COMMUNITY)
Admission: EM | Admit: 2017-10-05 | Discharge: 2017-10-05 | Disposition: A | Discharge status: Home / Self Care | Payer: Medicaid Other | Attending: Emergency Medicine | Admitting: Emergency Medicine

## 2017-10-05 DIAGNOSIS — R0789 Other chest pain: Secondary | ICD-10-CM | POA: Diagnosis not present

## 2017-10-05 DIAGNOSIS — I10 Essential (primary) hypertension: Secondary | ICD-10-CM | POA: Diagnosis not present

## 2017-10-05 DIAGNOSIS — M5413 Radiculopathy, cervicothoracic region: Secondary | ICD-10-CM | POA: Insufficient documentation

## 2017-10-05 DIAGNOSIS — F17210 Nicotine dependence, cigarettes, uncomplicated: Secondary | ICD-10-CM | POA: Diagnosis not present

## 2017-10-05 DIAGNOSIS — R202 Paresthesia of skin: Secondary | ICD-10-CM | POA: Insufficient documentation

## 2017-10-05 DIAGNOSIS — R079 Chest pain, unspecified: Secondary | ICD-10-CM

## 2017-10-05 LAB — BASIC METABOLIC PANEL
ANION GAP: 7 (ref 5–15)
BUN: 11 mg/dL (ref 6–20)
CALCIUM: 9 mg/dL (ref 8.9–10.3)
CO2: 30 mmol/L (ref 22–32)
CREATININE: 0.75 mg/dL (ref 0.44–1.00)
Chloride: 106 mmol/L (ref 101–111)
GFR calc Af Amer: >60 mL/min (ref >60)
GFR calc non Af Amer: >60 mL/min (ref >60)
Glucose, Bld: 111 mg/dL — ABNORMAL HIGH (ref 65–99)
Potassium: 3.7 mmol/L (ref 3.5–5.1)
Sodium: 143 mmol/L (ref 135–145)

## 2017-10-05 LAB — CBC
HCT: 36.4 % (ref 36.0–46.0)
HEMOGLOBIN: 12.4 g/dL (ref 12.0–15.0)
MCH: 33.3 pg (ref 26.0–34.0)
MCHC: 34.1 g/dL (ref 30.0–36.0)
MCV: 97.8 fL (ref 78.0–100.0)
Platelets: 217 10*3/uL (ref 150–400)
RBC: 3.72 MIL/uL — ABNORMAL LOW (ref 3.87–5.11)
RDW: 13.4 % (ref 11.5–15.5)
WBC: 4.7 10*3/uL (ref 4.0–10.5)

## 2017-10-05 LAB — I-STAT TROPONIN, ED
TROPONIN I, POC: 0 ng/mL (ref 0.00–0.08)
TROPONIN I, POC: 0 ng/mL (ref 0.00–0.08)

## 2017-10-05 LAB — I-STAT BETA HCG BLOOD, ED (MC, WL, AP ONLY): I-stat hCG, quantitative: <5 m[IU]/mL (ref <5)

## 2017-10-05 MED ORDER — PREDNISONE 10 MG (21) PO TBPK: ORAL_TABLET | Freq: Every day | ORAL | 0 refills | Status: DC
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
## 2017-10-05 NOTE — Discharge Instructions (Signed)
Please follow up with your doctor regarding your chest pain and symptoms.  If your chest pain returns, you develop shortness of breath, fevers over 100.4, or have any other concerns please seek additional medical evaluation.  I have given you a prescription for steroids today.  Some common side effects include feelings of extra energy, feeling warm, increased appetite, and stomach upset.  If you are diabetic your sugars may run higher than usual.
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
## 2017-10-06 MED FILL — predniSONE 10 MG TABS: 10 | 6 days supply | Qty: 21 | Fill #0

## 2017-10-13 MED FILL — ?AMLODIPINE BESYLATE 5 MG T: 5 MG | 30 days supply | Qty: 30 | Fill #1
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
## 2017-11-09 MED FILL — ?AMLODIPINE BESYLATE 5 MG T: 5 MG | 30 days supply | Qty: 30 | Fill #2

## 2017-11-20 ENCOUNTER — Encounter (HOSPITAL_COMMUNITY): Payer: Self-pay | Admitting: Emergency Medicine

## 2017-11-20 ENCOUNTER — Emergency Department (HOSPITAL_COMMUNITY)
Admission: EM | Admit: 2017-11-20 | Discharge: 2017-11-20 | Disposition: A | Discharge status: Home / Self Care | Payer: Medicaid Other | Attending: Emergency Medicine | Admitting: Emergency Medicine

## 2017-11-20 ENCOUNTER — Other Ambulatory Visit: Payer: Self-pay

## 2017-11-20 DIAGNOSIS — Z79899 Other long term (current) drug therapy: Secondary | ICD-10-CM | POA: Insufficient documentation

## 2017-11-20 DIAGNOSIS — N611 Abscess of the breast and nipple: Secondary | ICD-10-CM | POA: Insufficient documentation

## 2017-11-20 DIAGNOSIS — I10 Essential (primary) hypertension: Secondary | ICD-10-CM | POA: Diagnosis not present

## 2017-11-20 DIAGNOSIS — F17210 Nicotine dependence, cigarettes, uncomplicated: Secondary | ICD-10-CM | POA: Diagnosis not present

## 2017-11-20 MED ORDER — TRAMADOL HCL 50 MG PO TABS
50.0000 mg | ORAL_TABLET | Freq: Once | ORAL | Status: AC
  Administered 2017-11-20: 50 mg via ORAL
  Filled 2017-11-20: qty 1

## 2017-11-20 MED ORDER — TRAMADOL HCL 50 MG PO TABS: 50.0000 mg | ORAL_TABLET | Freq: Four times a day (QID) | ORAL | 0 refills | Status: DC | PRN

## 2017-11-20 MED ORDER — DOXYCYCLINE HYCLATE 100 MG PO CAPS: 100.0000 mg | ORAL_CAPSULE | Freq: Two times a day (BID) | ORAL | 0 refills | Status: DC

## 2017-11-20 NOTE — ED Provider Notes (Signed)
[facility] EMERGENCY DEPARTMENT Provider Note   CSN: [id number] Arrival date & time: 11/20/17  1611     History   Chief Complaint Chief Complaint  Patient presents with  . Abscess    HPI [name] is a 42 y.o. female.  Patient presents for evaluation of an abscess to the right breast. No recent skin trauma. Patient is not diabetic. Patient has been using a heating pad on the area, taking tylenol, no improvement. No drainage from nipple.   Abscess  Location:  Torso Torso abscess location:  R breast Abscess quality: fluctuance, induration and painful   Red streaking: no   Duration:  4 days Progression:  Worsening Pain details:    Quality:  Throbbing   Severity:  Moderate   Timing:  Constant   Progression:  Worsening Chronicity:  New Context: not diabetes and not skin injury   Ineffective treatments:  Warm compresses and draining/squeezing   Past Medical History:  Diagnosis Date  . Hypertension   . Left shoulder pain     Patient Active Problem List   Diagnosis Date Noted  . Radiculopathy of cervicothoracic region 11/22/2016  . Essential hypertension 11/21/2016  . Injury of left shoulder 08/04/2016  . Muscle spasm of left shoulder 12/31/2015  . Left shoulder pain 12/31/2015  . Tobacco dependence 12/31/2015    Past Surgical History:  Procedure Laterality Date  . CESAREAN SECTION    . TUBAL LIGATION       OB History   None      Home Medications    Prior to Admission medications   Medication Sig Start Date End Date Taking? Authorizing Provider  acetaminophen (TYLENOL) 500 MG tablet Take 1 tablet (500 mg total) by mouth every 6 (six) hours as needed. 01/08/17   Law, Waylan Boga, PA-C  amLODipine (NORVASC) 5 MG tablet Take 1 tablet (5 mg total) by mouth daily. 09/14/17   Massie Maroon, FNP  doxycycline (VIBRAMYCIN) 100 MG capsule Take 1 capsule (100 mg total) by mouth 2 (two) times daily. One po bid x 7 days 11/20/17   Felicie Morn, NP  lidocaine (LIDODERM) 5 % Place 1 patch onto the skin daily. Remove & Discard patch within 12 hours or as directed by MD    [provider]  predniSONE (STERAPRED UNI-PAK 21 TAB) 10 MG (21) TBPK tablet Take by mouth daily. Take 6 tabs by mouth daily  for 1 day, then 5 tabs for 1 day, then 4 tabs for 1 day, then 3 tabs for 1 day, 2 tabs for 1 day, then 1 tab by mouth daily for 1 day 10/05/17   Cristina Gong, PA-C  traMADol (ULTRAM) 50 MG tablet Take 1 tablet (50 mg total) by mouth every 6 (six) hours as needed for severe pain. 11/20/17   Felicie Morn, NP    Family History Family History  Problem Relation Age of Onset  . Cancer Father     Social History Social History   Tobacco Use  . Smoking status: Current Every Day Smoker    Packs/day: 1.00    Years: 15.00    Pack years: 15.00    Types: Cigarettes  . Smokeless tobacco: Never Used  . Tobacco comment: down to a half pack  Substance Use Topics  . Alcohol use: No  . Drug use: No     Allergies   Patient has no known allergies.   Review of Systems Review of Systems  Skin:  Right breast abscess.  All other systems reviewed and are negative.    Physical Exam Updated Vital Signs BP 117/89   Pulse 72   Temp 98.2 F (36.8 C) (Oral)   Resp 16   Ht 5' (1.524 m)   Wt 51.7 kg (114 lb)   LMP 10/25/2017 (Exact Date)   SpO2 100%   BMI 22.26 kg/m   Physical Exam  Constitutional: She is oriented to person, place, and time. She appears well-developed and well-nourished.  Neck: Neck supple.  Cardiovascular: Normal rate and regular rhythm.  Pulmonary/Chest: Effort normal and breath sounds normal.    Abdominal: Soft. She exhibits no distension.  Neurological: She is alert and oriented to person, place, and time.  Skin: Skin is warm and dry.  Psychiatric: She has a normal mood and affect.  Nursing note and vitals reviewed.    ED Treatments / Results  Labs (all labs ordered are listed, but  only abnormal results are displayed) Labs Reviewed - No data to display  EKG None  Radiology No results found.  Procedures Procedures (including critical care time)  Medications Ordered in ED Medications  traMADol (ULTRAM) tablet 50 mg (50 mg Oral Given 11/20/17 1842)     Initial Impression / Assessment and Plan / ED Course  I have reviewed the triage vital signs and the nursing notes.  Pertinent labs & imaging results that were available during my care of the patient were reviewed by me and considered in my medical decision making (see chart for details).     Patient with abscess of the breast. No indication of mastitis. Will refer to breast center/general surgery for I&D. Discussed with Dr. Juleen China, will cover with doxycycline.  Final Clinical Impressions(s) / ED Diagnoses   Final diagnoses:  Abscess of right breast unrelated to pregnancy or breastfeeding    ED Discharge Orders        Ordered    doxycycline (VIBRAMYCIN) 100 MG capsule  2 times daily     11/20/17 1834    traMADol (ULTRAM) 50 MG tablet  Every 6 hours PRN     11/20/17 1834       Felicie Morn, NP 11/20/17 2051    Raeford Razor, MD 11/21/17 1538

## 2017-11-20 NOTE — ED Triage Notes (Signed)
Pt c/o abscess on right nipple x's 4 days.  St's it drained a little but none now.

## 2017-11-20 NOTE — Discharge Instructions (Addendum)
Take antibiotic as directed. Follow-up with the breast center or general surgery as discussed.

## 2017-11-22 ENCOUNTER — Ambulatory Visit (INDEPENDENT_AMBULATORY_CARE_PROVIDER_SITE_OTHER): Payer: Medicaid Other | Admitting: Family Medicine

## 2017-11-22 ENCOUNTER — Encounter: Payer: Self-pay | Admitting: Family Medicine

## 2017-11-22 ENCOUNTER — Other Ambulatory Visit: Payer: Self-pay | Admitting: Family Medicine

## 2017-11-22 VITALS — BP 105/67 | HR 82 | Resp 16 | Ht 60.0 in | Wt 107.0 lb

## 2017-11-22 DIAGNOSIS — N611 Abscess of the breast and nipple: Secondary | ICD-10-CM | POA: Diagnosis not present

## 2017-11-22 DIAGNOSIS — F17200 Nicotine dependence, unspecified, uncomplicated: Secondary | ICD-10-CM | POA: Diagnosis not present

## 2017-11-22 DIAGNOSIS — N644 Mastodynia: Secondary | ICD-10-CM

## 2017-11-22 MED ORDER — KETOROLAC TROMETHAMINE 30 MG/ML IJ SOLN
30.0000 mg | Freq: Once | INTRAMUSCULAR | Status: AC
Start: 2017-11-22 — End: 2017-11-22
  Administered 2017-11-22: 30 mg via INTRAMUSCULAR

## 2017-11-22 MED ORDER — NAPROXEN 500 MG PO TABS: 500.0000 mg | ORAL_TABLET | Freq: Two times a day (BID) | ORAL | 0 refills | Status: DC

## 2017-11-22 MED FILL — ?NAPROXEN 500MG TABLET: 500 | 15 days supply | Qty: 30 | Fill #0

## 2017-11-22 NOTE — Progress Notes (Signed)
Chief Complaint  Patient presents with  . Recurrent Skin Infections    on right breast    Subjective:    [name], female    DOB: 10-22-1975, 42 y.o.   MRN: [id number]  HPI [name], a 42 year old female that presents complaining of an abscess to right breast. Patient was evaluated in the emergency department on 11/20/2017 for this problem. She has been taking Doxycycline 100 mg twice daily and applying warm compresses to site without improvement. She says that abscess continues to swell. She endorses right breast pain. She characterized pain as constant and throbbing. Current pain intensity is 10/10. She has been taking Tramadol 50 mg every 6 hours as needed with unsatisfactory improvement. She says that she was advised to follow up in primary care because she will need a mammogram. Patient denies family history of breast cancer  Past Medical History:  Diagnosis Date  . Hypertension   . Left shoulder pain    Social History   Socioeconomic History  . Marital status: Single    Spouse name: Not on file  . Number of children: Not on file  . Years of education: Not on file  . Highest education level: Not on file  Occupational History  . Not on file  Social Needs  . Financial resource strain: Not on file  . Food insecurity:    Worry: Not on file    Inability: Not on file  . Transportation needs:    Medical: Not on file    Non-medical: Not on file  Tobacco Use  . Smoking status: Current Every Day Smoker    Packs/day: 1.00    Years: 15.00    Pack years: 15.00    Types: Cigarettes  . Smokeless tobacco: Never Used  . Tobacco comment: down to a half pack  Substance and Sexual Activity  . Alcohol use: No  . Drug use: No  . Sexual activity: Not on file  Lifestyle  . Physical activity:    Days per week: Not on file    Minutes per session: Not on file  . Stress: Not on file  Relationships  . Social connections:    Talks on phone: Not on file    Gets  together: Not on file    Attends religious service: Not on file    Active member of club or organization: Not on file    Attends meetings of clubs or organizations: Not on file    Relationship status: Not on file  . Intimate partner violence:    Fear of current or ex partner: Not on file    Emotionally abused: Not on file    Physically abused: Not on file    Forced sexual activity: Not on file  Other Topics Concern  . Not on file  Social History Narrative  . Not on file   Immunization History  Administered Date(s) Administered  . Tdap 08/04/2016    Review of Systems  Constitutional: Negative.   HENT: Negative.   Respiratory: Negative.   Cardiovascular: Negative.   Gastrointestinal: Negative.   Endocrine: Negative for polydipsia, polyphagia and polyuria.  Musculoskeletal: Negative.   Skin: Positive for wound.       Right nipple abscess  Neurological: Negative.   Hematological: Negative.   Psychiatric/Behavioral: Negative.        Objective:   Physical Exam  Constitutional: She is oriented to person, place, and time. She appears well-developed and well-nourished.  Eyes: Pupils are equal,  round, and reactive to light.  Neck: Normal range of motion.  Cardiovascular: Normal rate, regular rhythm and normal heart sounds.  Pulmonary/Chest: Effort normal and breath sounds normal. Right breast exhibits skin change. There is breast swelling and tenderness. No breast bleeding. Breasts are asymmetrical.  Nondraining, erythema, tender to palpation, with underlying induration.     Abdominal: Soft. Bowel sounds are normal.  Neurological: She is alert and oriented to person, place, and time.  Skin: Skin is warm and dry.         BP 105/67 (BP Location: Left Arm, Patient Position: Sitting, Cuff Size: Normal)   Pulse 82   Resp 16   Ht 5' (1.524 m)   Wt 107 lb (48.5 kg)   LMP 10/25/2017 (Exact Date)   SpO2 100%   BMI 20.90 kg/m  Assessment & Plan:  1. Abscess of right  breast Continue Doxycyline 100 mg twice daily until completion Apply warm, moist compresses - US BREAST LTD UNI RIGHT INC AXILLA; Future - MM DIAG BREAST TOMO BILATERAL; Future  2. Nipple pain - naproxen (NAPROSYN) 500 MG tablet; Take 1 tablet (500 mg total) by mouth 2 (two) times daily with a meal.  Dispense: 30 tablet; Refill: 0 - ketorolac (TORADOL) 30 MG/ML injection 30 mg  3. Tobacco dependence Smoking cessation instruction/counseling given:  counseled patient on the dangers of tobacco use, advised patient to stop smoking, and reviewed strategies to maximize success   RTC: 1 week for reevaluation  Nolon Nations  MSN, FNP-C [facility] [address] [place], [place] [address] [phone]

## 2017-11-22 NOTE — Patient Instructions (Signed)
You have an abscess to your right breast, please continue doxycycline 100 mg twice daily.  Apply warm moist compresses to the breast throughout the day as needed.  For right nipple pain and inflammation, start naproxen 500 mg twice daily as needed for mild to moderate pain. Scheduled diagnostic mammogram and ultrasound at breast center.  We will follow-up after reviewing Skin Abscess A skin abscess is an infected area on or under your skin that contains pus and other material. An abscess can happen almost anywhere on your body. Some abscesses break open (rupture) on their own. Most continue to get worse unless they are treated. The infection can spread deeper into the body and into your blood, which can make you feel sick. Treatment usually involves draining the abscess. Follow these instructions at home: Abscess Care  If you have an abscess that has not drained, place a warm, clean, wet washcloth over the abscess several times a day. Do this as told by your doctor.  Follow instructions from your doctor about how to take care of your abscess. Make sure you: ? Cover the abscess with a bandage (dressing). ? Change your bandage or gauze as told by your doctor. ? Wash your hands with soap and water before you change the bandage or gauze. If you cannot use soap and water, use hand sanitizer.  Check your abscess every day for signs that the infection is getting worse. Check for: ? More redness, swelling, or pain. ? More fluid or blood. ? Warmth. ? More pus or a bad smell. Medicines   Take over-the-counter and prescription medicines only as told by your doctor.  If you were prescribed an antibiotic medicine, take it as told by your doctor. Do not stop taking the antibiotic even if you start to feel better. General instructions  To avoid spreading the infection: ? Do not share personal care items, towels, or hot tubs with others. ? Avoid making skin-to-skin contact with other people.  Keep all  follow-up visits as told by your doctor. This is important. Contact a doctor if:  You have more redness, swelling, or pain around your abscess.  You have more fluid or blood coming from your abscess.  Your abscess feels warm when you touch it.  You have more pus or a bad smell coming from your abscess.  You have a fever.  Your muscles ache.  You have chills.  You feel sick. Get help right away if:  You have very bad (severe) pain.  You see red streaks on your skin spreading away from the abscess. This information is not intended to replace advice given to you by your health care provider. Make sure you discuss any questions you have with your health care provider. Document Released: 12/28/2007 Document Revised: 03/06/2016 Document Reviewed: 05/20/2015 Elsevier Interactive Patient Education  [employer].  results.

## 2017-11-23 ENCOUNTER — Other Ambulatory Visit: Payer: Medicaid Other

## 2017-11-24 ENCOUNTER — Other Ambulatory Visit: Payer: Self-pay | Admitting: Family Medicine

## 2017-11-24 ENCOUNTER — Ambulatory Visit
Admission: RE | Admit: 2017-11-24 | Discharge: 2017-11-24 | Disposition: A | Discharge status: Home / Self Care | Payer: Medicaid Other | Source: Ambulatory Visit | Attending: Family Medicine | Admitting: Family Medicine

## 2017-11-24 DIAGNOSIS — N611 Abscess of the breast and nipple: Secondary | ICD-10-CM

## 2017-11-25 ENCOUNTER — Encounter: Payer: Self-pay | Admitting: Family Medicine

## 2017-11-27 ENCOUNTER — Encounter: Payer: Self-pay | Admitting: Family Medicine

## 2017-11-27 ENCOUNTER — Ambulatory Visit (INDEPENDENT_AMBULATORY_CARE_PROVIDER_SITE_OTHER): Payer: Medicaid Other | Admitting: Family Medicine

## 2017-11-27 VITALS — BP 123/77 | HR 77 | Temp 98.6°F | Resp 16 | Ht 60.0 in | Wt 108.0 lb

## 2017-11-27 DIAGNOSIS — N611 Abscess of the breast and nipple: Secondary | ICD-10-CM

## 2017-11-27 DIAGNOSIS — N6001 Solitary cyst of right breast: Secondary | ICD-10-CM | POA: Diagnosis not present

## 2017-11-27 DIAGNOSIS — F17200 Nicotine dependence, unspecified, uncomplicated: Secondary | ICD-10-CM

## 2017-11-27 MED ORDER — SULFAMETHOXAZOLE-TRIMETHOPRIM 800-160 MG PO TABS: 1.0000 | ORAL_TABLET | Freq: Two times a day (BID) | ORAL | 0 refills | Status: DC

## 2017-11-27 MED FILL — SULFAMETHOXAZOLE-TMP DS TAB: 800-160 | 10 days supply | Qty: 20 | Fill #0

## 2017-11-27 NOTE — Progress Notes (Signed)
Chief Complaint  Patient presents with  . Follow-up    abscess of right breast    Subjective:    [name], female    DOB: 13-Oct-1975, 42 y.o.   MRN: [id number]  HPI [name], a 43 year old female that presents complaining of an abscess to right breast. Patient was evaluated in the emergency department on 11/20/2017 for this problem. She has been taking Doxycycline 100 mg twice daily and applying warm compresses to site with minimal improvement.  She says that abscess started draining prior to mammogram and continues to drain moderately. She endorses right breast pain. She characterized pain as constant and throbbing. Current pain intensity is 6/10.  She has been taking Tramadol 50 mg and Ibuprofen with moderate relief. Patient has an appointment scheduled with general surgery on 12/04/2017 for further work up and evaluation of problem.   CLINICAL DATA:  Patient presents for evaluation of abscess within the periareolar right breast. Patient was started on antibiotic therapy on 11/20/2017. Patient reports the abscess began spontaneously draining on 11/23/2017 and has decreased in size.  EXAM: DIGITAL DIAGNOSTIC BILATERAL MAMMOGRAM WITH CAD AND TOMO  ULTRASOUND RIGHT BREAST  COMPARISON:  None  ACR Breast Density Category d: The breast tissue is extremely dense, which lowers the sensitivity of mammography.  FINDINGS: There is periareolar skin thickening within the medial aspect of the right breast. No concerning masses, calcifications or nonsurgical distortion identified within either breast.  Mammographic images were processed with CAD.  On physical exam, there is a fluctuant area noted within the medial areola with an open draining wound centrally.  Targeted ultrasound is performed, showing a 2.6 x 0.9 x 2.5 cm lobular hypoechoic masslike area within the periareolar right breast 4 o'clock position at the site of clinical concern. No  significant mobile fluid demonstrated centrally within this lesion.  IMPRESSION: Findings suggestive of abscess/phlegmonous change within the periareolar right breast which has spontaneously drained. Patient currently on antibiotic therapy.  RECOMMENDATION: 1. Surgical consultation for spontaneously draining areolar abscess right breast. Consultation will be scheduled for the patient. 2. Follow-up right breast ultrasound in 10 days to ensure resolution of phlegmonous change/abscess within the right breast areola 4 o'clock position.  I have discussed the findings and recommendations with the patient. Results were also provided in writing at the conclusion of the visit. If applicable, a reminder letter will be sent to the patient regarding the next appointment.  BI-RADS CATEGORY  3: Probably benign.   Electronically Signed   By: Annia Belt M.D.   On: 11/24/2017 16:30  Past Medical History:  Diagnosis Date  . Hypertension   . Left shoulder pain    Social History   Socioeconomic History  . Marital status: Single    Spouse name: Not on file  . Number of children: Not on file  . Years of education: Not on file  . Highest education level: Not on file  Occupational History  . Not on file  Social Needs  . Financial resource strain: Not on file  . Food insecurity:    Worry: Not on file    Inability: Not on file  . Transportation needs:    Medical: Not on file    Non-medical: Not on file  Tobacco Use  . Smoking status: Current Every Day Smoker    Packs/day: 1.00    Years: 15.00    Pack years: 15.00    Types: Cigarettes  . Smokeless tobacco: Never Used  . Tobacco  comment: down to a half pack  Substance and Sexual Activity  . Alcohol use: No  . Drug use: No  . Sexual activity: Not on file  Lifestyle  . Physical activity:    Days per week: Not on file    Minutes per session: Not on file  . Stress: Not on file  Relationships  . Social connections:    Talks  on phone: Not on file    Gets together: Not on file    Attends religious service: Not on file    Active member of club or organization: Not on file    Attends meetings of clubs or organizations: Not on file    Relationship status: Not on file  . Intimate partner violence:    Fear of current or ex partner: Not on file    Emotionally abused: Not on file    Physically abused: Not on file    Forced sexual activity: Not on file  Other Topics Concern  . Not on file  Social History Narrative  . Not on file   Immunization History  Administered Date(s) Administered  . Tdap 08/04/2016    Review of Systems  Constitutional: Negative.   HENT: Negative.   Respiratory: Negative.   Cardiovascular: Negative.   Gastrointestinal: Negative.   Endocrine: Negative for polydipsia, polyphagia and polyuria.  Musculoskeletal: Negative.   Skin: Positive for wound.       Right nipple abscess  Neurological: Negative.   Hematological: Negative.   Psychiatric/Behavioral: Negative.        Objective:   Physical Exam  Constitutional: She is oriented to person, place, and time. She appears well-developed and well-nourished.  Eyes: Pupils are equal, round, and reactive to light.  Neck: Normal range of motion.  Cardiovascular: Normal rate, regular rhythm and normal heart sounds.  Pulmonary/Chest: Effort normal and breath sounds normal. Right breast exhibits skin change. There is breast swelling and tenderness. No breast bleeding. Breasts are asymmetrical.  Tan drainage, erythema, tender to palpation, with underlying induration.     Abdominal: Soft. Bowel sounds are normal.  Neurological: She is alert and oriented to person, place, and time.  Skin: Skin is warm and dry.         BP 123/77 (BP Location: Left Arm, Patient Position: Sitting, Cuff Size: Normal)   Pulse 77   Temp 98.6 F (37 C) (Oral)   Resp 16   Ht 5' (1.524 m)   Wt 108 lb (49 kg)   SpO2 100%   BMI 21.09 kg/m  Assessment &  Plan:  Abscess of breast, right Follow up in general surgery on 12/04/2017 - sulfamethoxazole-trimethoprim (BACTRIM DS,SEPTRA DS) 800-160 MG tablet; Take 1 tablet by mouth 2 (two) times daily.  Dispense: 20 tablet; Refill: 0 - WOUND CULTURE   Tobacco dependence Smoking cessation instruction/counseling given:  counseled patient on the dangers of tobacco use, advised patient to stop smoking, and reviewed strategies to maximize success   RTC: 3 months for hypertension  Nolon Nations  MSN, FNP-C Patient Care [facility] [address] [place], [place] [address] [phone]

## 2017-11-27 NOTE — Patient Instructions (Signed)
We will continue antibiotic for abscess of right breast due to purulent drainage.  Will start Bactrim 800-160 mg every 12 hours for 10 days.  Please take medication with food to prevent any GI upset. Skin Abscess A skin abscess is an infected area on or under your skin that contains pus and other material. An abscess can happen almost anywhere on your body. Some abscesses break open (rupture) on their own. Most continue to get worse unless they are treated. The infection can spread deeper into the body and into your blood, which can make you feel sick. Treatment usually involves draining the abscess. Follow these instructions at home: Abscess Care  If you have an abscess that has not drained, place a warm, clean, wet washcloth over the abscess several times a day. Do this as told by your doctor.  Follow instructions from your doctor about how to take care of your abscess. Make sure you: ? Cover the abscess with a bandage (dressing). ? Change your bandage or gauze as told by your doctor. ? Wash your hands with soap and water before you change the bandage or gauze. If you cannot use soap and water, use hand sanitizer.  Check your abscess every day for signs that the infection is getting worse. Check for: ? More redness, swelling, or pain. ? More fluid or blood. ? Warmth. ? More pus or a bad smell. Medicines   Take over-the-counter and prescription medicines only as told by your doctor.  If you were prescribed an antibiotic medicine, take it as told by your doctor. Do not stop taking the antibiotic even if you start to feel better. General instructions  To avoid spreading the infection: ? Do not share personal care items, towels, or hot tubs with others. ? Avoid making skin-to-skin contact with other people.  Keep all follow-up visits as told by your doctor. This is important. Contact a doctor if:  You have more redness, swelling, or pain around your abscess.  You have more fluid or  blood coming from your abscess.  Your abscess feels warm when you touch it.  You have more pus or a bad smell coming from your abscess.  You have a fever.  Your muscles ache.  You have chills.  You feel sick. Get help right away if:  You have very bad (severe) pain.  You see red streaks on your skin spreading away from the abscess. This information is not intended to replace advice given to you by your health care provider. Make sure you discuss any questions you have with your health care provider. Document Released: 12/28/2007 Document Revised: 03/06/2016 Document Reviewed: 05/20/2015 Elsevier Interactive Patient Education  [employer].

## 2017-11-29 LAB — WOUND CULTURE: ORGANISM ID, BACTERIA: NONE SEEN

## 2017-12-04 ENCOUNTER — Other Ambulatory Visit: Payer: Medicaid Other

## 2017-12-11 MED FILL — ?AMLODIPINE BESYLATE 5 MG T: 5 MG | 30 days supply | Qty: 30 | Fill #3

## 2017-12-14 ENCOUNTER — Ambulatory Visit: Admission: RE | Admit: 2017-12-14 | Payer: Medicaid Other | Source: Ambulatory Visit

## 2017-12-14 ENCOUNTER — Ambulatory Visit
Admission: RE | Admit: 2017-12-14 | Discharge: 2017-12-14 | Disposition: A | Discharge status: Home / Self Care | Payer: Medicaid Other | Source: Ambulatory Visit | Attending: Family Medicine | Admitting: Family Medicine

## 2017-12-14 DIAGNOSIS — N611 Abscess of the breast and nipple: Secondary | ICD-10-CM

## 2017-12-21 ENCOUNTER — Ambulatory Visit: Payer: Medicaid Other | Admitting: Family Medicine

## 2017-12-27 ENCOUNTER — Ambulatory Visit (INDEPENDENT_AMBULATORY_CARE_PROVIDER_SITE_OTHER): Payer: Medicaid Other | Admitting: Family Medicine

## 2017-12-27 VITALS — BP 112/73 | HR 66 | Temp 98.8°F | Resp 14 | Ht 60.0 in | Wt 107.0 lb

## 2017-12-27 DIAGNOSIS — J302 Other seasonal allergic rhinitis: Secondary | ICD-10-CM

## 2017-12-27 DIAGNOSIS — F17200 Nicotine dependence, unspecified, uncomplicated: Secondary | ICD-10-CM | POA: Diagnosis not present

## 2017-12-27 DIAGNOSIS — R05 Cough: Secondary | ICD-10-CM | POA: Diagnosis not present

## 2017-12-27 DIAGNOSIS — R0981 Nasal congestion: Secondary | ICD-10-CM | POA: Diagnosis not present

## 2017-12-27 DIAGNOSIS — I10 Essential (primary) hypertension: Secondary | ICD-10-CM

## 2017-12-27 DIAGNOSIS — R059 Cough, unspecified: Secondary | ICD-10-CM

## 2017-12-27 MED ORDER — FLUTICASONE PROPIONATE 50 MCG/ACT NA SUSP: 2.0000 | Freq: Every day | NASAL | 6 refills | Status: DC

## 2017-12-27 MED ORDER — GUAIFENESIN 100 MG/5ML PO SOLN: 5.0000 mL | ORAL | 0 refills | Status: DC | PRN

## 2017-12-27 MED ORDER — CETIRIZINE HCL 10 MG PO TABS: 10.0000 mg | ORAL_TABLET | Freq: Every day | ORAL | 11 refills | Status: DC

## 2017-12-27 MED FILL — ?SILTUSSIN SA 100 MG/5ML SY: 100 | 3 days supply | Qty: 118 | Fill #0

## 2017-12-27 MED FILL — ?CETIRIZINE HCL 10 MG TABLE: 10 | 30 days supply | Qty: 30 | Fill #0

## 2017-12-27 MED FILL — FLUTICASONE PROP 50 MCG SPR: 50 | 30 days supply | Qty: 16 | Fill #0

## 2017-12-27 NOTE — Progress Notes (Signed)
Subjective:    [name], female    DOB: 01-30-1976, 42 y.o.   MRN: [id number]  [name], a pleasant 42 year old female with a history of hypertension presents complaining of symptoms of allergic rhinitis.  Patient says that symptoms have been present over the past 3 days. Symptoms include headache, runny nose, nasal congestion, and post nasal drip. [name] says that she did not attempt OTC interventions to alleviate symptoms due to history of hypertension. She denies any sick contacts. She is tolerating fluids well. She denies fever, chills, malaise, fatigue, ear pain, or ear pressure.     Past Medical History:  Diagnosis Date  . Hypertension   . Left shoulder pain    Social History   Socioeconomic History  . Marital status: Single    Spouse name: Not on file  . Number of children: Not on file  . Years of education: Not on file  . Highest education level: Not on file  Occupational History  . Not on file  Social Needs  . Financial resource strain: Not on file  . Food insecurity:    Worry: Not on file    Inability: Not on file  . Transportation needs:    Medical: Not on file    Non-medical: Not on file  Tobacco Use  . Smoking status: Current Every Day Smoker    Packs/day: 1.00    Years: 15.00    Pack years: 15.00    Types: Cigarettes  . Smokeless tobacco: Never Used  . Tobacco comment: down to a half pack  Substance and Sexual Activity  . Alcohol use: No  . Drug use: No  . Sexual activity: Not on file  Lifestyle  . Physical activity:    Days per week: Not on file    Minutes per session: Not on file  . Stress: Not on file  Relationships  . Social connections:    Talks on phone: Not on file    Gets together: Not on file    Attends religious service: Not on file    Active member of club or organization: Not on file    Attends meetings of clubs or organizations: Not on file    Relationship status: Not on file  . Intimate partner violence:     Fear of current or ex partner: Not on file    Emotionally abused: Not on file    Physically abused: Not on file    Forced sexual activity: Not on file  Other Topics Concern  . Not on file  Social History Narrative  . Not on file    Review of Systems  Constitutional: Negative.   HENT: Positive for congestion, postnasal drip and rhinorrhea.   Eyes: Negative.   Gastrointestinal: Negative.   Endocrine: Negative.   Genitourinary: Negative.   Allergic/Immunologic: Negative.   Neurological: Negative.   Hematological: Negative.   Psychiatric/Behavioral: Negative.        Objective:   Physical Exam  Constitutional: She is oriented to person, place, and time. She appears well-developed and well-nourished.  HENT:  Head: Normocephalic.  Nose: Mucosal edema present.  Mouth/Throat: Oropharyngeal exudate present.  TM Dull bilaterally  Cardiovascular: Normal rate, regular rhythm and normal heart sounds.  Pulmonary/Chest: Effort normal and breath sounds normal.  Abdominal: Soft. Bowel sounds are normal.  Musculoskeletal: Normal range of motion.  Neurological: She is alert and oriented to person, place, and time.         BP 112/73 (BP  Location: Right Arm, Patient Position: Sitting, Cuff Size: Normal)   Pulse 66   Temp 98.8 F (37.1 C) (Oral)   Resp 14   Ht 5' (1.524 m)   Wt 107 lb (48.5 kg)   SpO2 100%   BMI 20.90 kg/m   Assessment & Plan:  1. Essential hypertension Blood pressure is at goal on current medication regimen. No changes warranted on today.  We have discussed target BP range and blood pressure goal. I have advised patient to check BP regularly and to call us back or report to clinic if the numbers are consistently higher than 140/90. We discussed the importance of compliance with medical therapy and DASH diet recommended, consequences of uncontrolled hypertension discussed.  - continue current BP medications   2. Seasonal allergic rhinitis, unspecified  trigger - cetirizine (ZYRTEC) 10 MG tablet; Take 1 tablet (10 mg total) by mouth daily.  Dispense: 30 tablet; Refill: 11 - fluticasone (FLONASE) 50 MCG/ACT nasal spray; Place 2 sprays into both nostrils daily.  Dispense: 16 g; Refill: 6  3. Nasal congestion - fluticasone (FLONASE) 50 MCG/ACT nasal spray; Place 2 sprays into both nostrils daily.  Dispense: 16 g; Refill: 6  4. Cough - guaiFENesin (ROBITUSSIN) 100 MG/5ML SOLN; Take 5 mLs (100 mg total) by mouth every 4 (four) hours as needed for cough or to loosen phlegm.  Dispense: 118 mL; Refill:   5. Tobacco dependence Smoking cessation instruction/counseling given:  counseled patient on the dangers of tobacco use, advised patient to stop smoking, and reviewed strategies to maximize success   Nolon NationsLachina Moore Jaydin Boniface  MSN, FNP-C [facility] [address]  [place], [place] [id number] [phone]

## 2017-12-27 NOTE — Patient Instructions (Signed)
Blood pressure at goal on current medication regimen For allergic rhinitis will start cetirizine 10 mg daily, Flonase 1 spray to each nare twice daily and guaifenesin every 4 hours as needed for cough.  If symptoms worsen please notify clinic. Increase water intake to 6 to 8 glasses a day while symptoms are present Wash hands consistently to prevent spread of infection

## 2017-12-29 ENCOUNTER — Encounter: Payer: Self-pay | Admitting: Family Medicine

## 2018-01-11 MED FILL — ?AMLODIPINE BESYLATE 5 MG T: 5 MG | 30 days supply | Qty: 30 | Fill #4

## 2018-02-09 MED FILL — ?AMLODIPINE BESYLATE 5 MG T: 5 MG | 30 days supply | Qty: 30 | Fill #5

## 2018-03-14 ENCOUNTER — Telehealth: Payer: Self-pay

## 2018-03-15 ENCOUNTER — Encounter: Payer: Self-pay | Admitting: Family Medicine

## 2018-03-15 ENCOUNTER — Ambulatory Visit (INDEPENDENT_AMBULATORY_CARE_PROVIDER_SITE_OTHER): Payer: Medicaid Other | Admitting: Family Medicine

## 2018-03-15 VITALS — BP 134/88 | HR 61 | Temp 98.3°F | Resp 16 | Ht 60.0 in | Wt 103.0 lb

## 2018-03-15 DIAGNOSIS — I10 Essential (primary) hypertension: Secondary | ICD-10-CM

## 2018-03-15 DIAGNOSIS — F4321 Adjustment disorder with depressed mood: Secondary | ICD-10-CM | POA: Diagnosis not present

## 2018-03-15 DIAGNOSIS — Z23 Encounter for immunization: Secondary | ICD-10-CM | POA: Diagnosis not present

## 2018-03-15 LAB — POCT URINALYSIS DIPSTICK
Bilirubin, UA: NEGATIVE
Glucose, UA: NEGATIVE
Ketones, UA: NEGATIVE
Leukocytes, UA: NEGATIVE
Nitrite, UA: NEGATIVE
Protein, UA: POSITIVE — AB
Spec Grav, UA: >=1.03 — AB (ref 1.010–1.025)
Urobilinogen, UA: 1 E.U./dL
pH, UA: 5.5 (ref 5.0–8.0)

## 2018-03-15 MED ORDER — AMLODIPINE BESYLATE 5 MG PO TABS: 5.0000 mg | ORAL_TABLET | Freq: Every day | ORAL | 5 refills | Status: DC

## 2018-03-15 MED FILL — AMLODIPINE BESYLATE 5 MG TA: 5 | 30 days supply | Qty: 30 | Fill #0

## 2018-03-15 NOTE — Progress Notes (Signed)
[facility]  Provider: Mike GipAndre Latrisha Coiro, FNP   Hypertension Follow Up Visit  SUBJECTIVE:  [name] is a 42 y.o. female with hypertension.   New concerns: Patient's mother passed away suddenly at the end of June. Patient is having difficulty with dealing with this loss. 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 The school announced her mother's passing and people have been reaching out to her regarding this. She feels overwhelmed due to this.  Patient denies SI, HI, AH, VH. No previous inpatient or outpatient psychiatric admissions or diagnosis.  Current Outpatient Medications  Medication Sig Dispense Refill  . acetaminophen (TYLENOL) 500 MG tablet Take 1 tablet (500 mg total) by mouth every 6 (six) hours as needed. 30 tablet 0  . amLODipine (NORVASC) 5 MG tablet Take 1 tablet (5 mg total) by mouth daily. 30 tablet 5  . cetirizine (ZYRTEC) 10 MG tablet Take 1 tablet (10 mg total) by mouth daily. 30 tablet 11  . fluticasone (FLONASE) 50 MCG/ACT nasal spray Place 2 sprays into both nostrils daily. 16 g 6  . lidocaine (LIDODERM) 5 % Place 1 patch onto the skin daily. Remove & Discard patch within 12 hours or as directed by MD    . naproxen (NAPROSYN) 500 MG tablet Take 1 tablet (500 mg total) by mouth 2 (two) times daily with a meal. (Patient not taking: Reported on 03/15/2018) 30 tablet 0  . traMADol (ULTRAM) 50 MG tablet Take 1 tablet (50 mg total) by mouth every 6 (six) hours as needed for severe pain. (Patient not taking: Reported on 03/15/2018) 12 tablet 0   No current facility-administered medications for this visit.     Recent Results (from the past 2160 hour(s))  Urinalysis Dipstick     Status: Abnormal   Collection Time: 03/15/18  8:23 AM  Result Value Ref Range   Color, UA     Clarity, UA     Glucose, UA Negative Negative   Bilirubin, UA neg    Ketones, UA neg    Spec Grav, UA  >=1.030 (A) 1.010 - 1.025   Blood, UA moderate    pH, UA 5.5 5.0 - 8.0   Protein, UA Positive (A) Negative    Comment: positive   Urobilinogen, UA 1.0 0.2 or 1.0 E.U./dL   Nitrite, UA neg    Leukocytes, UA Negative Negative   Appearance     Odor      Hypertension ROS: Review of Systems  Constitutional: Negative.   HENT: Negative.   Eyes: Negative.   Respiratory: Negative.   Cardiovascular: Negative.   Gastrointestinal: Negative.   Genitourinary: Negative.   Musculoskeletal: Negative.   Skin: Negative.   Neurological: Negative.   Psychiatric/Behavioral: Positive for depression. Negative for hallucinations, memory loss, substance abuse and suicidal ideas. The patient is nervous/anxious.      OBJECTIVE:   BP 134/88 (BP Location: Left Arm, Patient Position: Sitting, Cuff Size: Normal)   Pulse 61   Temp 98.3 F (36.8 C) (Oral)   Resp 16   Ht 5' (1.524 m)   Wt 103 lb (46.7 kg)   LMP 03/05/2018   SpO2 100%   BMI 20.12 kg/m   Physical Exam  Constitutional: She is oriented to person, place, and time. She appears well-developed and well-nourished. No distress.  HENT:  Head: Normocephalic.  Eyes: Pupils are equal, round, and reactive to light. Conjunctivae and EOM are normal.  Neck: Normal range of motion. No JVD present.  Cardiovascular: Normal rate, regular rhythm, normal heart sounds and intact distal pulses.  No murmur heard. Pulmonary/Chest: Effort normal and breath sounds normal.  Musculoskeletal: Normal range of motion.  Neurological: She is alert and oriented to person, place, and time.  Psychiatric: Her speech is normal and behavior is normal. Judgment and thought content normal. Cognition and memory are normal. She exhibits a depressed mood.  Patient is interacting appropriately. Crying throughout the encounter. She is consolable.      ASSESSMENT/PLAN:   1. Essential hypertension - Urinalysis Dipstick - amLODipine (NORVASC) 5 MG tablet; Take 1 tablet (5 mg  total) by mouth daily.  Dispense: 30 tablet; Refill: 5 - Comprehensive metabolic panel - Lipid Panel  2. Flu vaccine need - Flu Vaccine QUAD 6+ mos PF IM (Fluarix Quad PF)  3. Grief reaction Referred for counseling. Gave patient the phone number for local grief therapist. Encouraged journaling and mindfulness for relaxation and relief.   No medication changes today.   Return to care as scheduled and prn. Patient verbalized understanding and agreed with plan of care.    Ms. Freda Jackson. Riley Lam, FNP-BC [facility] [address] [place], [place] [address] [phone]

## 2018-03-15 NOTE — Patient Instructions (Signed)
Vergia Alberts, LPC  [address], [place] [address] [place] [address] Phone: [phone]  Website: [email or web address]  Coping With Loss, Adult People experience loss in many different ways throughout their lives. Events such as moving, changing jobs, and losing friends can create a sense of loss. The loss may be as serious as a major health change, divorce, death of a pet, or death of a loved one. All of these types of loss are likely to create a physical and emotional reaction known as grief. Grief is the result of a major change or an absence of something or someone that you count on. Grief is a normal reaction to loss. How to recognize changes A variety of factors can affect your grieving experience, including:  The nature of your loss.  Your relationship to what or whom you lost.  Your understanding of grief and how to cope with it.  Your support system.  The way that you deal with your grief will affect your ability to function as you normally do. When you are grieving, you may experience:  Numbness, shock, sadness, anxiety, anger, denial, and guilt.  Thoughts about death.  Unexpected crying.  A physical sensation of emptiness in your gut.  Problems sleeping and eating.  Fatigue.  Loss of interest in normal activities.  Dreaming about or imagining seeing the person who died.  A need to remember what or whom you lost.  Difficulty thinking about anything other than your loss for a period of time.  Relief. If you have been expecting the loss for a while, you may feel a sense of relief when it happens.  Where to find support To get support for coping with loss:  Ask your health care provider for help and recommendations, such as grief counseling or therapy.  Think about joining a support group for people who are coping with loss.  Follow these instructions at home:  Be patient with yourself and others. Allow the grieving process to happen, and remember  that grieving takes time. ? It is likely that you may never feel completely done with some grief. You may find a way to move on while still cherishing memories and feelings about your loss. ? Accepting your loss is a process. It can take months or longer to adjust.  Express your feelings in healthy ways, such as: ? Talking with others about your loss. It may be helpful to find others who have had a similar loss, such as a support group. ? Writing down your feelings in a journal. ? Doing physical activities to release stress and emotional energy. ? Doing creative activities like painting, sculpting, or playing or listening to music. ? Practicing resilience. This is the ability to recover and adjust after facing challenges. Reading some resources that encourage resilience may help you to learn ways to practice those behaviors.  Keep to your normal routine as much as possible. If you have trouble focusing or doing normal activities, it is acceptable to take some time away from your normal routine.  Spend time with friends and loved ones.  Eat a healthy diet, get plenty of sleep, and rest when you feel tired. Where to find more information: You can find more information about coping with loss from:  American Society of Clinical Oncology: www.cancer.net  American Psychological Association: DiceTournament.ca  Contact a health care provider if:  Your grief is extreme and keeps getting worse.  You have ongoing grief that does not improve.  Your body shows symptoms  of grief, such as illness.  You feel depressed, anxious, or lonely. Get help right away if:  You have thoughts about hurting yourself or others. If you ever feel like you may hurt yourself or others, or have thoughts about taking your own life, get help right away. You can go to your nearest emergency department or call:  Your local emergency services (911 in the [place]  A suicide crisis helpline, such as the National Suicide  Prevention Lifeline at (671)288-64961-(618) 656-5090. This is open 24 hours a day.  Summary  Grief is a normal part of experiencing a loss. It is the result of a major change or an absence of something or someone that you count on.  The depth of grief and the period of recovery depend on the type of loss as well as your ability to adjust to the change and process your feelings.  Processing grief requires patience and a willingness to accept your feelings and talk about your loss with people who are supportive.  It is important to find resources that work for you and to realize that we are all different when it comes to grief. There is not one single grieving process that works for everyone in the same way.  Be aware that when grief becomes extreme, it can lead to more severe issues like isolation, depression, anxiety, or suicidal thoughts. Talk with your health care provider if you have any of these issues. This information is not intended to replace advice given to you by your health care provider. Make sure you discuss any questions you have with your health care provider. Document Released: 11/24/2016 Document Revised: 11/24/2016 Document Reviewed: 11/24/2016 Elsevier Interactive Patient Education  [employer]2018 Elsevier Inc.  phone: [phone]

## 2018-03-16 LAB — COMPREHENSIVE METABOLIC PANEL
ALT: 22 IU/L (ref 0–32)
AST: 15 IU/L (ref 0–40)
Albumin/Globulin Ratio: 2 (ref 1.2–2.2)
Albumin: 4.5 g/dL (ref 3.5–5.5)
Alkaline Phosphatase: 51 IU/L (ref 39–117)
BUN/Creatinine Ratio: 18 (ref 9–23)
BUN: 14 mg/dL (ref 6–24)
Bilirubin Total: 0.7 mg/dL (ref 0.0–1.2)
CO2: 21 mmol/L (ref 20–29)
Calcium: 9 mg/dL (ref 8.7–10.2)
Chloride: 105 mmol/L (ref 96–106)
Creatinine, Ser: 0.79 mg/dL (ref 0.57–1.00)
GFR calc Af Amer: 107 mL/min/{1.73_m2} (ref >59)
GFR calc non Af Amer: 93 mL/min/{1.73_m2} (ref >59)
Globulin, Total: 2.3 g/dL (ref 1.5–4.5)
Glucose: 100 mg/dL — ABNORMAL HIGH (ref 65–99)
Potassium: 3.9 mmol/L (ref 3.5–5.2)
Sodium: 139 mmol/L (ref 134–144)
Total Protein: 6.8 g/dL (ref 6.0–8.5)

## 2018-03-16 LAB — LIPID PANEL
Chol/HDL Ratio: 2.4 ratio (ref 0.0–4.4)
Cholesterol, Total: 112 mg/dL (ref 100–199)
HDL: 46 mg/dL (ref >39)
LDL Calculated: 56 mg/dL (ref 0–99)
Triglycerides: 49 mg/dL (ref 0–149)
VLDL Cholesterol Cal: 10 mg/dL (ref 5–40)

## 2018-04-13 MED FILL — AMLODIPINE BESYLATE 5 MG TA: 5 | 30 days supply | Qty: 30 | Fill #1

## 2018-04-24 ENCOUNTER — Encounter (HOSPITAL_COMMUNITY): Payer: Self-pay | Admitting: Emergency Medicine

## 2018-04-24 ENCOUNTER — Emergency Department (HOSPITAL_COMMUNITY)
Admission: EM | Admit: 2018-04-24 | Discharge: 2018-04-24 | Disposition: A | Discharge status: Home / Self Care | Payer: Medicaid Other | Attending: Emergency Medicine | Admitting: Emergency Medicine

## 2018-04-24 ENCOUNTER — Other Ambulatory Visit: Payer: Self-pay

## 2018-04-24 DIAGNOSIS — R52 Pain, unspecified: Secondary | ICD-10-CM | POA: Diagnosis not present

## 2018-04-24 DIAGNOSIS — M62838 Other muscle spasm: Secondary | ICD-10-CM | POA: Insufficient documentation

## 2018-04-24 DIAGNOSIS — I10 Essential (primary) hypertension: Secondary | ICD-10-CM | POA: Diagnosis not present

## 2018-04-24 DIAGNOSIS — F17210 Nicotine dependence, cigarettes, uncomplicated: Secondary | ICD-10-CM | POA: Diagnosis not present

## 2018-04-24 DIAGNOSIS — E860 Dehydration: Secondary | ICD-10-CM | POA: Diagnosis not present

## 2018-04-24 DIAGNOSIS — M25519 Pain in unspecified shoulder: Secondary | ICD-10-CM | POA: Diagnosis not present

## 2018-04-24 MED ORDER — METHOCARBAMOL 500 MG PO TABS: 500.0000 mg | ORAL_TABLET | Freq: Two times a day (BID) | ORAL | 0 refills | Status: DC

## 2018-04-24 MED ORDER — METHOCARBAMOL 500 MG PO TABS
500.0000 mg | ORAL_TABLET | Freq: Once | ORAL | Status: AC
  Administered 2018-04-24: 500 mg via ORAL
  Filled 2018-04-24: qty 1

## 2018-04-24 NOTE — ED Notes (Signed)
Declined W/C at D/C and was escorted to lobby by RN. 

## 2018-04-24 NOTE — ED Provider Notes (Signed)
[facility] EMERGENCY DEPARTMENT Provider Note   CSN: [id number] Arrival date & time: 04/24/18  1549     History   Chief Complaint Chief Complaint  Patient presents with  . Spasms    HPI [name] is a 42 y.o. female.  HPI   42 year old female presents today with complaints of muscle spasms.  She has a significant past medical history of the same.  She notes left-sided shoulder muscle spasms in the past that were improved with Robaxin.  She notes symptoms again yesterday but in the right posterior shoulder.  She feels that she has not been drinking enough water and only drinks soda.  She notes the pain is sharp in nature coming and going.  She denies any distal neurological deficits, spasms in other area of the body, chest pain or shortness of breath.  She called EMS which gave her aspirin and fluids prior to arrival that improved her symptoms.            Past Medical History:  Diagnosis Date  . Hypertension   . Left shoulder pain     Patient Active Problem List   Diagnosis Date Noted  . Radiculopathy of cervicothoracic region 11/22/2016  . Essential hypertension 11/21/2016  . Injury of left shoulder 08/04/2016  . Muscle spasm of left shoulder 12/31/2015  . Left shoulder pain 12/31/2015  . Tobacco dependence 12/31/2015    Past Surgical History:  Procedure Laterality Date  . CESAREAN SECTION    . TUBAL LIGATION       OB History   None      Home Medications    Prior to Admission medications   Medication Sig Start Date End Date Taking? Authorizing Provider  acetaminophen (TYLENOL) 500 MG tablet Take 1 tablet (500 mg total) by mouth every 6 (six) hours as needed. 01/08/17   Law, Waylan Boga, PA-C  amLODipine (NORVASC) 5 MG tablet Take 1 tablet (5 mg total) by mouth daily. 03/15/18   Mike Gip, FNP  cetirizine (ZYRTEC) 10 MG tablet Take 1 tablet (10 mg total) by mouth daily. 12/27/17   Massie Maroon, FNP  fluticasone (FLONASE)  50 MCG/ACT nasal spray Place 2 sprays into both nostrils daily. 12/27/17   Massie Maroon, FNP  lidocaine (LIDODERM) 5 % Place 1 patch onto the skin daily. Remove & Discard patch within 12 hours or as directed by MD    [provider]  methocarbamol (ROBAXIN) 500 MG tablet Take 1 tablet (500 mg total) by mouth 2 (two) times daily. 04/24/18   Kirat Mezquita, Tinnie Gens, PA-C  naproxen (NAPROSYN) 500 MG tablet Take 1 tablet (500 mg total) by mouth 2 (two) times daily with a meal. Patient not taking: Reported on 03/15/2018 11/22/17   Massie Maroon, FNP  traMADol (ULTRAM) 50 MG tablet Take 1 tablet (50 mg total) by mouth every 6 (six) hours as needed for severe pain. Patient not taking: Reported on 03/15/2018 11/20/17   Felicie Morn, NP    Family History Family History  Problem Relation Age of Onset  . Cancer Father     Social History Social History   Tobacco Use  . Smoking status: Current Every Day Smoker    Packs/day: 1.00    Years: 15.00    Pack years: 15.00    Types: Cigarettes  . Smokeless tobacco: Never Used  . Tobacco comment: down to a half pack  Substance Use Topics  . Alcohol use: No  . Drug use: No  Allergies   Patient has no known allergies.   Review of Systems Review of Systems  All other systems reviewed and are negative.    Physical Exam Updated Vital Signs BP (!) 132/91 (BP Location: Right Arm)   Pulse 74   Temp 98.2 F (36.8 C) (Oral)   Resp 18   Ht 5' (1.524 m)   Wt 51.7 kg   LMP 04/16/2018   SpO2 100%   BMI 22.26 kg/m   Physical Exam  Constitutional: She is oriented to person, place, and time. She appears well-developed and well-nourished.  HENT:  Head: Normocephalic and atraumatic.  Eyes: Pupils are equal, round, and reactive to light. Conjunctivae are normal. Right eye exhibits no discharge. Left eye exhibits no discharge. No scleral icterus.  Neck: Normal range of motion. No JVD present. No tracheal deviation present.  Pulmonary/Chest:  Effort normal. No stridor.  Musculoskeletal:  Tenderness palpation of posterior shoulder and trapezius, no active spasms full active range of motion of the upper extremity distal sensation strength motor function intact  Neurological: She is alert and oriented to person, place, and time. Coordination normal.  Psychiatric: She has a normal mood and affect. Her behavior is normal. Judgment and thought content normal.  Nursing note and vitals reviewed.    ED Treatments / Results  Labs (all labs ordered are listed, but only abnormal results are displayed) Labs Reviewed - No data to display  EKG None  Radiology No results found.  Procedures Procedures (including critical care time)  Medications Ordered in ED Medications  methocarbamol (ROBAXIN) tablet 500 mg (500 mg Oral Given 04/24/18 1741)     Initial Impression / Assessment and Plan / ED Course  I have reviewed the triage vital signs and the nursing notes.  Pertinent labs & imaging results that were available during my care of the patient were reviewed by me and considered in my medical decision making (see chart for details).    42 year old female presents today with likely muscle spasm.  She is asymptomatic at time of evaluation other than tenderness to palpation.  She has no other symptoms presently.  She was given Robaxin, encouraged to follow-up as an outpatient return immediately if she develops any new or worsening signs or symptoms.  She verbalized understanding and agreement to today's plan had no further questions concerns.  Final Clinical Impressions(s) / ED Diagnoses   Final diagnoses:  Muscle spasm    ED Discharge Orders         Ordered    methocarbamol (ROBAXIN) 500 MG tablet  2 times daily     04/24/18 1716           Eyvonne Mechanic, PA-C 04/25/18 1221    Curatolo, Adam, DO 04/25/18 1454

## 2018-04-24 NOTE — Discharge Instructions (Addendum)
Please read attached information. If you experience any new or worsening signs or symptoms please return to the emergency room for evaluation. Please follow-up with your primary care provider or specialist as discussed. Please use medication prescribed only as directed and discontinue taking if you have any concerning signs or symptoms.   °

## 2018-04-24 NOTE — ED Triage Notes (Signed)
Pt arrived EMS with c/o back spasm radiating to her shoulder on top R side. Pt reports she had a similar spasm yesterday but it went away and today it started and hasnt stopped. Pt states she took 324ASA prior to EMS arrival. Vitals with EMS BP134/80 P72 RR16 O2100% RA. Pt believes it may be dehydration related. Was given NS with EMS through a 18g IV LAC

## 2018-04-24 NOTE — ED Triage Notes (Signed)
PCP info given to Pt. by CM

## 2018-04-25 MED FILL — METHOCARBAMOL 500 MG TABS: 500 | 10 days supply | Qty: 20 | Fill #0

## 2018-05-10 MED FILL — AMLODIPINE BESYLATE 5 MG TA: 5 | 30 days supply | Qty: 30 | Fill #2

## 2018-06-12 MED FILL — AMLODIPINE BESYLATE 5 MG TA: 5 | 30 days supply | Qty: 30 | Fill #3

## 2018-07-13 MED FILL — AMLODIPINE BESYLATE 5 MG TA: 5 | 30 days supply | Qty: 30 | Fill #4

## 2018-08-14 MED FILL — AMLODIPINE BESYLATE 5 MG TA: 5 | 30 days supply | Qty: 30 | Fill #5

## 2018-09-19 ENCOUNTER — Encounter: Payer: Self-pay | Admitting: Family Medicine

## 2018-09-19 ENCOUNTER — Ambulatory Visit (INDEPENDENT_AMBULATORY_CARE_PROVIDER_SITE_OTHER): Payer: Medicaid Other | Admitting: Family Medicine

## 2018-09-19 VITALS — BP 116/76 | HR 63 | Temp 98.1°F | Resp 16 | Ht 60.0 in | Wt 108.0 lb

## 2018-09-19 DIAGNOSIS — I10 Essential (primary) hypertension: Secondary | ICD-10-CM

## 2018-09-19 DIAGNOSIS — F17209 Nicotine dependence, unspecified, with unspecified nicotine-induced disorders: Secondary | ICD-10-CM | POA: Diagnosis not present

## 2018-09-19 DIAGNOSIS — M62838 Other muscle spasm: Secondary | ICD-10-CM | POA: Diagnosis not present

## 2018-09-19 MED ORDER — AMLODIPINE BESYLATE 5 MG PO TABS: 5.0000 mg | ORAL_TABLET | Freq: Every day | ORAL | 5 refills | Status: DC

## 2018-09-19 MED ORDER — METHOCARBAMOL 500 MG PO TABS: 500.0000 mg | ORAL_TABLET | Freq: Two times a day (BID) | ORAL | 0 refills | Status: DC

## 2018-09-19 MED ORDER — METHOCARBAMOL 500 MG PO TABS: 500.0000 mg | ORAL_TABLET | Freq: Two times a day (BID) | ORAL | 1 refills | Status: DC

## 2018-09-19 MED FILL — METHOCARBAMOL 500 MG TABS: 500 | 10 days supply | Qty: 20 | Fill #0

## 2018-09-19 MED FILL — AMLODIPINE BESYLATE 5 MG TA: 5 | 30 days supply | Qty: 30 | Fill #0

## 2018-09-19 NOTE — Patient Instructions (Signed)
Smoking and Musculoskeletal Health Smoking is bad for your health. Most people know that smoking causes lung disease, heart disease, and cancer. But people may not realize that it also affects their bones, muscles, and joints (musculoskeletal system). When you smoke, the effects on your lungs and heart result in less oxygen for your musculoskeletal system. This can lead to poor bone and joint health. How can smoking affect my musculoskeletal health? Smoking can:  Increase your risk of having weak, thin bones (osteoporosis). Elderly smokers are at higher risk for bone fractures related to osteoporosis.  Decrease the ability of bone-forming cells to make and replace bone (in addition to reducing oxygen and blood flow).  Reduce your body's ability to absorb calcium from your diet. Less calcium means weaker bones.  Interfere with the breakdown of the female hormone estrogen. Smoking lowers estrogen, which is a hormone that helps keep bones strong. Women who smoke may have earlier menopause. Menopause is a risk factor for osteoporosis.  Weaken the tissues that attach bones to muscles (tendons). This can lead to shoulder, back, and other joint injuries.  Increase your risk of rheumatoid arthritis or make the condition worse if you already have it.  Slow down healing and increase your risk of infection and other complications if you have a bone fracture or surgery that involves your musculoskeletal system.  Make you get out of breath easily. This can keep you from getting the exercise you need to keep your bones and joints healthy.  Decrease your appetite and body mass. You may lose weight and muscle strength. This can put you at higher risk for muscle injury, joint injury, and broken bones. What actions can I take to prevent musculoskeletal problems? Quit smoking      Do not start smoking. Quit if you already do. Even stopping later in life can improve musculoskeletal health.  Do not use any  products that contain nicotine or tobacco. Do not replace cigarette smoking with e-cigarettes. The safety of e-cigarettes is not known, and some may contain harmful chemicals.  Make a plan to quit smoking and commit to it. Look for programs to help you, and ask your health care provider for recommendations and ideas.  Talk with your health care provider about using nicotine replacement medicines to help you quit, such as gum, lozenges, patches, sprays, or pills. Make other lifestyle changes   Eat a healthy diet that includes calcium and vitamin D. These nutrients are important for bone health. ? Calcium is found in dairy foods and green leafy vegetables. ? Vitamin D is found in eggs, fish, and liver. ? Many foods also have vitamin D and calcium added to them (are fortified). ? Ask your health care provider if you would benefit from taking a supplement.  Get out in the sunshine for a short time every day. This increases production of vitamin D.  Get 30 minutes of exercise at least 5 days a week. Weight-bearing and strength exercises are best for musculoskeletal health. Ask your health care provider what type of exercise is safe for you.  Do not drink alcohol if: ? Your health care provider tells you not to drink. ? You are pregnant, may be pregnant, or are planning to become pregnant.  If you drink alcohol, limit how much you have: ? 0-1 drink a day for women. ? 0-2 drinks a day for men.  Be aware of how much alcohol is in your drink. In the [place] one drink equals one 12 oz bottle   of beer (355 mL), one 5 oz glass of wine (148 mL), or one 1 oz glass of hard liquor (44 mL). Where to find more information You may find more information about smoking, musculoskeletal health, and quitting smoking from:  American Academy of Orthopaedic Surgeons: orthoinfo.aaos.org  [employer] of Health, Osteoporosis and Related Bone Diseases [employer]: bones.http://www.myers.net/  HelpGuide.org:  helpguide.org  BankRights.[employer]: smokefree.gov  American Lung Association: lung.org Contact a health care provider if:  You need help to quit smoking. Summary  When you smoke, the effects on your lungs and heart result in less oxygen for your musculoskeletal system.  Even stopping smoking later in life can improve musculoskeletal health.  Do not use any products that contain nicotine or tobacco, such as cigarettes and e-cigarettes.  If you need help quitting, ask your health care provider. This information is not intended to replace advice given to you by your health care provider. Make sure you discuss any questions you have with your health care provider. Document Released: 11/06/2017 Document Revised: 11/06/2017 Document Reviewed: 11/06/2017 Elsevier Interactive Patient Education  2019 [employer]. Managing Your Hypertension Hypertension is commonly called high blood pressure. This is when the force of your blood pressing against the walls of your arteries is too strong. Arteries are blood vessels that carry blood from your heart throughout your body. Hypertension forces the heart to work harder to pump blood, and may cause the arteries to become narrow or stiff. Having untreated or uncontrolled hypertension can cause heart attack, stroke, kidney disease, and other problems. What are blood pressure readings? A blood pressure reading consists of a higher number over a lower number. Ideally, your blood pressure should be below 120/80. The first ("top") number is called the systolic pressure. It is a measure of the pressure in your arteries as your heart beats. The second ("bottom") number is called the diastolic pressure. It is a measure of the pressure in your arteries as the heart relaxes. What does my blood pressure reading mean? Blood pressure is classified into four stages. Based on your blood pressure reading, your health care provider may use the following stages to determine what  type of treatment you need, if any. Systolic pressure and diastolic pressure are measured in a unit called mm Hg. Normal  Systolic pressure: below 120.  Diastolic pressure: below 80. Elevated  Systolic pressure: 120-129.  Diastolic pressure: below 80. Hypertension stage 1  Systolic pressure: 130-139.  Diastolic pressure: 80-89. Hypertension stage 2  Systolic pressure: 140 or above.  Diastolic pressure: 90 or above. What health risks are associated with hypertension? Managing your hypertension is an important responsibility. Uncontrolled hypertension can lead to:  A heart attack.  A stroke.  A weakened blood vessel (aneurysm).  Heart failure.  Kidney damage.  Eye damage.  Metabolic syndrome.  Memory and concentration problems. What changes can I make to manage my hypertension? Hypertension can be managed by making lifestyle changes and possibly by taking medicines. Your health care provider will help you make a plan to bring your blood pressure within a normal range. Eating and drinking   Eat a diet that is high in fiber and potassium, and low in salt (sodium), added sugar, and fat. An example eating plan is called the DASH (Dietary Approaches to Stop Hypertension) diet. To eat this way: ? Eat plenty of fresh fruits and vegetables. Try to fill half of your plate at each meal with fruits and vegetables. ? Eat whole grains, such as whole wheat  pasta, brown rice, or whole grain bread. Fill about one quarter of your plate with whole grains. ? Eat low-fat diary products. ? Avoid fatty cuts of meat, processed or cured meats, and poultry with skin. Fill about one quarter of your plate with lean proteins such as fish, chicken without skin, beans, eggs, and tofu. ? Avoid premade and processed foods. These tend to be higher in sodium, added sugar, and fat.  Reduce your daily sodium intake. Most people with hypertension should eat less than 1,500 mg of sodium a day.  Limit  alcohol intake to no more than 1 drink a day for nonpregnant women and 2 drinks a day for men. One drink equals 12 oz of beer, 5 oz of wine, or 1 oz of hard liquor. Lifestyle  Work with your health care provider to maintain a healthy body weight, or to lose weight. Ask what an ideal weight is for you.  Get at least 30 minutes of exercise that causes your heart to beat faster (aerobic exercise) most days of the week. Activities may include walking, swimming, or biking.  Include exercise to strengthen your muscles (resistance exercise), such as weight lifting, as part of your weekly exercise routine. Try to do these types of exercises for 30 minutes at least 3 days a week.  Do not use any products that contain nicotine or tobacco, such as cigarettes and e-cigarettes. If you need help quitting, ask your health care provider.  Control any long-term (chronic) conditions you have, such as high cholesterol or diabetes. Monitoring  Monitor your blood pressure at home as told by your health care provider. Your personal target blood pressure may vary depending on your medical conditions, your age, and other factors.  Have your blood pressure checked regularly, as often as told by your health care provider. Working with your health care provider  Review all the medicines you take with your health care provider because there may be side effects or interactions.  Talk with your health care provider about your diet, exercise habits, and other lifestyle factors that may be contributing to hypertension.  Visit your health care provider regularly. Your health care provider can help you create and adjust your plan for managing hypertension. Will I need medicine to control my blood pressure? Your health care provider may prescribe medicine if lifestyle changes are not enough to get your blood pressure under control, and if:  Your systolic blood pressure is 130 or higher.  Your diastolic blood pressure is  80 or higher. Take medicines only as told by your health care provider. Follow the directions carefully. Blood pressure medicines must be taken as prescribed. The medicine does not work as well when you skip doses. Skipping doses also puts you at risk for problems. Contact a health care provider if:  You think you are having a reaction to medicines you have taken.  You have repeated (recurrent) headaches.  You feel dizzy.  You have swelling in your ankles.  You have trouble with your vision. Get help right away if:  You develop a severe headache or confusion.  You have unusual weakness or numbness, or you feel faint.  You have severe pain in your chest or abdomen.  You vomit repeatedly.  You have trouble breathing. Summary  Hypertension is when the force of blood pumping through your arteries is too strong. If this condition is not controlled, it may put you at risk for serious complications.  Your personal target blood pressure may vary depending  on your medical conditions, your age, and other factors. For most people, a normal blood pressure is less than 120/80.  Hypertension is managed by lifestyle changes, medicines, or both. Lifestyle changes include weight loss, eating a healthy, low-sodium diet, exercising more, and limiting alcohol. This information is not intended to replace advice given to you by your health care provider. Make sure you discuss any questions you have with your health care provider. Document Released: 04/04/2012 Document Revised: 06/08/2016 Document Reviewed: 06/08/2016 Elsevier Interactive Patient Education  2019 [employer].

## 2018-09-19 NOTE — Progress Notes (Signed)
[facility]   Progress Note: General Provider: Mike Gip, FNP  SUBJECTIVE:   [name] is a 43 y.o. female who  has a past medical history of Hypertension and Left shoulder pain.. Patient presents today for Hypertension and Back Pain (back spasms ) Patient presents for follow-up on hypertension.  She reports that she has not taken her BP medications in the past 2 days.  She denies side effects from medication at the present time.  Patient seen in October 2019 for muscle spasms in her back.  She states that she had relief with Robaxin in the past.  Presents today with back spasms in the upper shoulder.She states that she is having to push heavy objects at work.  Patient reports reducing smoking to 6 cigs per day from 1-2 ppd. She is not interested in quitting at the present time.  Review of Systems  Constitutional: Negative.   HENT: Negative.   Eyes: Negative.   Respiratory: Negative.   Cardiovascular: Negative.   Gastrointestinal: Negative.   Genitourinary: Negative.   Musculoskeletal: Positive for joint pain (left shoulder).  Skin: Negative.   Neurological: Negative.   Psychiatric/Behavioral: Negative.      OBJECTIVE: BP 116/76 (BP Location: Left Arm, Patient Position: Sitting, Cuff Size: Normal)   Pulse 63   Temp 98.1 F (36.7 C) (Oral)   Resp 16   Ht 5' (1.524 m)   Wt 108 lb (49 kg)   LMP 08/27/2018   SpO2 100%   BMI 21.09 kg/m   Wt Readings from Last 3 Encounters:  09/19/18 108 lb (49 kg)  04/24/18 114 lb (51.7 kg)  03/15/18 103 lb (46.7 kg)     Physical Exam Vitals signs and nursing note reviewed.  Constitutional:      General: She is not in acute distress.    Appearance: She is well-developed.  HENT:     Head: Normocephalic and atraumatic.  Eyes:     Conjunctiva/sclera: Conjunctivae normal.     Pupils: Pupils are equal, round, and reactive to light.  Neck:     Musculoskeletal: Normal range of  motion.  Cardiovascular:     Rate and Rhythm: Normal rate and regular rhythm.     Heart sounds: Normal heart sounds.  Pulmonary:     Effort: Pulmonary effort is normal. No respiratory distress.     Breath sounds: Normal breath sounds.  Abdominal:     General: Bowel sounds are normal. There is no distension.     Palpations: Abdomen is soft.  Musculoskeletal: Normal range of motion.     Comments: Tenderness of the left cervical and left shoulder. Muscle tightness noted.   Skin:    General: Skin is warm and dry.  Neurological:     Mental Status: She is alert and oriented to person, place, and time.  Psychiatric:        Behavior: Behavior normal.        Thought Content: Thought content normal.     ASSESSMENT/PLAN:   1. Essential hypertension The current medical regimen is effective;  continue present plan and medications.  - Urinalysis Dipstick - amLODipine (NORVASC) 5 MG tablet; Take 1 tablet (5 mg total) by mouth daily.  Dispense: 30 tablet; Refill: 5  2. Tobacco use disorder, continuous Smoking cessation instruction/counseling given:  counseled patient on the dangers of tobacco use, advised patient to stop smoking, and reviewed strategies to maximize success  3. Muscle spasm of left shoulder ROM exercises  and muscle relaxer. Discussed risks and benefits of medications.  - methocarbamol (ROBAXIN) 500 MG tablet; Take 1 tablet (500 mg total) by mouth 2 (two) times daily.  Dispense: 30 tablet; Refill: 1     Return in about 1 week (around 09/26/2018) for pap and fasting labs.    The patient was given clear instructions to go to ER or return to medical center if symptoms do not improve, worsen or new problems develop. The patient verbalized understanding and agreed with plan of care.   Ms. Freda Jackson. Riley Lam, FNP-BC [facility] [address] [place], [place] [address] [phone]

## 2018-09-26 ENCOUNTER — Other Ambulatory Visit: Payer: Medicaid Other

## 2018-10-01 ENCOUNTER — Other Ambulatory Visit: Payer: Medicaid Other

## 2018-10-15 MED FILL — AMLODIPINE BESYLATE 5 MG TA: 5 | 30 days supply | Qty: 30 | Fill #1

## 2018-11-06 ENCOUNTER — Telehealth: Payer: Self-pay

## 2018-11-06 ENCOUNTER — Other Ambulatory Visit: Payer: Self-pay

## 2018-11-06 DIAGNOSIS — M62838 Other muscle spasm: Secondary | ICD-10-CM

## 2018-11-06 MED ORDER — METHOCARBAMOL 500 MG PO TABS: 500.0000 mg | ORAL_TABLET | Freq: Two times a day (BID) | ORAL | 1 refills | Status: DC

## 2018-11-06 MED FILL — METHOCARBAMOL 500 MG TABS: 500 | 15 days supply | Qty: 30 | Fill #0

## 2018-11-06 NOTE — Telephone Encounter (Signed)
Patient notified that medication sent to pharmacy

## 2018-11-20 MED FILL — AMLODIPINE BESYLATE 5 MG TA: 5 | 90 days supply | Qty: 90 | Fill #2

## 2018-11-29 NOTE — Telephone Encounter (Signed)
Message sent to provider 

## 2019-01-07 ENCOUNTER — Telehealth: Payer: Self-pay

## 2019-01-07 DIAGNOSIS — M62838 Other muscle spasm: Secondary | ICD-10-CM

## 2019-01-07 MED ORDER — METHOCARBAMOL 500 MG PO TABS: 500.0000 mg | ORAL_TABLET | Freq: Two times a day (BID) | ORAL | 1 refills | Status: DC

## 2019-01-07 MED FILL — METHOCARBAMOL 500 MG TABS: 500 | 15 days supply | Qty: 30 | Fill #0

## 2019-01-07 NOTE — Telephone Encounter (Signed)
Refill sent into pharmacy. Thanks!  

## 2019-01-08 ENCOUNTER — Telehealth: Payer: Self-pay

## 2019-01-08 NOTE — Telephone Encounter (Signed)
Called to do COVID Screening for appointment tomorrow. No answer. Left a message to call back. Thanks! 

## 2019-01-09 ENCOUNTER — Other Ambulatory Visit: Payer: Self-pay

## 2019-01-09 ENCOUNTER — Encounter: Payer: Self-pay | Admitting: Family Medicine

## 2019-01-09 ENCOUNTER — Ambulatory Visit (INDEPENDENT_AMBULATORY_CARE_PROVIDER_SITE_OTHER): Payer: Medicaid Other | Admitting: Family Medicine

## 2019-01-09 VITALS — BP 113/80 | HR 68 | Temp 98.7°F | Resp 14 | Ht 60.0 in | Wt 107.0 lb

## 2019-01-09 DIAGNOSIS — Z01419 Encounter for gynecological examination (general) (routine) without abnormal findings: Secondary | ICD-10-CM | POA: Diagnosis not present

## 2019-01-09 DIAGNOSIS — N939 Abnormal uterine and vaginal bleeding, unspecified: Secondary | ICD-10-CM | POA: Diagnosis not present

## 2019-01-09 DIAGNOSIS — N946 Dysmenorrhea, unspecified: Secondary | ICD-10-CM | POA: Diagnosis not present

## 2019-01-09 LAB — HM PAP SMEAR

## 2019-01-09 LAB — RESULTS CONSOLE HPV: CHL HPV: NEGATIVE

## 2019-01-09 NOTE — Patient Instructions (Signed)

## 2019-01-09 NOTE — Progress Notes (Signed)
[facility]  Annual GYN Examination Provider: Mike GipAndre Jeovanny Cuadros, FNP   SUBJECTIVE: [name] is a 43 y.o. female who presents for her annual gynecological examination.  Current concerns: patient states that she is concerned about decreased libido. She states that she is currently sexual active with female partner. Hx of tubal ligation. Since then, she noticed a lump in the lower abdomen. She denies pain to the area. Is concerned for possible hernia. She also states that she is having heavy menstrual bleeding and cramping. No known history of fibroids.     GYNECOLOGICAL HISTORY: Patient's last menstrual period was 12/12/2018. Contraception: none Last Pap: unknown.  Last mammogram:2019. Results were: normal  OBSTETRIC HISTORY: OB History  No obstetric history on file.     The following portions of the patient's history were reviewed and updated as appropriate: allergies, current medications, past family history, past medical history, past social history, past surgical history and problem list.  REVIEW OF SYSTEMS: Review of Systems  Constitutional: Negative.   HENT: Negative.   Eyes: Negative.   Respiratory: Negative.   Cardiovascular: Negative.   Gastrointestinal: Negative.        Possible mass. Non painful.    Genitourinary: Negative.   Musculoskeletal: Negative.   Skin: Negative.   Neurological: Negative.   Psychiatric/Behavioral: Negative.       OBJECTIVE:  Physical Exam Constitutional:      General: She is not in acute distress.    Appearance: She is obese.  Genitourinary:     Pelvic exam was performed with patient in the lithotomy position.     Vulva, inguinal canal, urethra, bladder, vagina, cervix, uterus, right adnexa, left adnexa and rectum normal.  HENT:     Head: Normocephalic and atraumatic.  Eyes:     Extraocular Movements: Extraocular movements intact.     Conjunctiva/sclera: Conjunctivae normal.      Pupils: Pupils are equal, round, and reactive to light.  Cardiovascular:     Rate and Rhythm: Normal rate.     Pulses: Normal pulses.  Pulmonary:     Effort: Pulmonary effort is normal.  Chest:     Breasts: Tanner Score is 5. Breasts are symmetrical.        Right: Normal.        Left: Normal.  Abdominal:     General: Abdomen is flat. Bowel sounds are normal.     Palpations: Abdomen is soft.     Tenderness: There is no abdominal tenderness.  Lymphadenopathy:     Upper Body:     Right upper body: No supraclavicular, axillary or pectoral adenopathy.     Left upper body: No supraclavicular, axillary or pectoral adenopathy.  Neurological:     Mental Status: She is alert and oriented to person, place, and time.  Psychiatric:        Mood and Affect: Mood normal.        Behavior: Behavior normal.        Thought Content: Thought content normal.        Judgment: Judgment normal.  Vitals signs and nursing note reviewed. Exam conducted with a chaperone present.      ASSESSMENT/PLAN:  1. Severe menstrual cramps Discussed medications for this to include otc Aleve. Due to age and smoking status, not eligible for OCPs.  - US Pelvic Complete With Transvaginal; Future  2. Encounter for well woman exam with routine gynecological exam - Pap IG, CT/NG w/ reflex HPV when ASC-U (  [facility])  3. Abnormal uterine and vaginal bleeding, unspecified Labs ordered.  - TSH - FSH/LH - Estrogens, Total - Testosterone     Education reviewed: low fat, low cholesterol diet, safe sex/STD prevention, self breast exams, smoking cessation and weight bearing exercise. Follow up in: 6 months.    Return to care as scheduled and prn. Patient verbalized understanding and agreed with plan of care.   Ms. Doug Sou. Nathaneil Canary, FNP-BC [facility] [address] [place], [place] [address] [phone]

## 2019-01-11 ENCOUNTER — Other Ambulatory Visit: Payer: Self-pay

## 2019-01-11 ENCOUNTER — Ambulatory Visit (HOSPITAL_COMMUNITY)
Admission: RE | Admit: 2019-01-11 | Discharge: 2019-01-11 | Disposition: A | Discharge status: Home / Self Care | Payer: Medicaid Other | Source: Ambulatory Visit | Attending: Family Medicine | Admitting: Family Medicine

## 2019-01-11 ENCOUNTER — Telehealth: Payer: Self-pay

## 2019-01-11 DIAGNOSIS — R109 Unspecified abdominal pain: Secondary | ICD-10-CM | POA: Diagnosis not present

## 2019-01-11 DIAGNOSIS — N946 Dysmenorrhea, unspecified: Secondary | ICD-10-CM

## 2019-01-11 LAB — FSH/LH
FSH: 5.7 m[IU]/mL
LH: 8.8 m[IU]/mL

## 2019-01-11 LAB — ESTROGENS, TOTAL: Estrogen: 133 pg/mL

## 2019-01-11 LAB — TSH: TSH: 0.431 u[IU]/mL — ABNORMAL LOW (ref 0.450–4.500)

## 2019-01-11 LAB — TESTOSTERONE: Testosterone: 7 ng/dL — ABNORMAL LOW (ref 8–48)

## 2019-01-11 NOTE — Telephone Encounter (Signed)
Called and advised of all labs stable and to continue current medications. Thanks!

## 2019-01-11 NOTE — Progress Notes (Signed)
The  labs are stable without significant clinical change.  All other results are normal or within acceptable limits. No Medication changes at the present time. Please return for your follow up appts.  

## 2019-01-11 NOTE — Telephone Encounter (Signed)
-----   Message from Lanae Boast, [place] sent at 01/11/2019  1:36 PM EDT ----- The  labs are stable without significant clinical change.  All other results are normal or within acceptable limits. No Medication changes at the present time. Please return for your follow up appts.

## 2019-01-13 LAB — PAP IG, CT-NG, RFX HPV ASCU
Chlamydia, Nuc. Acid Amp: NEGATIVE
Gonococcus by Nucleic Acid Amp: NEGATIVE

## 2019-01-16 NOTE — Progress Notes (Signed)
Ultrasound was normal

## 2019-01-16 NOTE — Progress Notes (Signed)
Your pap smear was normal. This means that you do not have any abnormal cells.  You will not need another pap smear for 3-5 years. Please continue to follow up yearly for your well woman exams and continue with monthly self breast exams.

## 2019-02-21 MED FILL — METHOCARBAMOL 500 MG TABS: 500 | 15 days supply | Qty: 30 | Fill #1

## 2019-02-21 MED FILL — AMLODIPINE BESYLATE 5 MG TA: 5 | 30 days supply | Qty: 30 | Fill #3

## 2019-03-06 ENCOUNTER — Ambulatory Visit (INDEPENDENT_AMBULATORY_CARE_PROVIDER_SITE_OTHER): Payer: Medicaid Other | Admitting: Family Medicine

## 2019-03-06 ENCOUNTER — Other Ambulatory Visit: Payer: Self-pay

## 2019-03-06 ENCOUNTER — Encounter: Payer: Self-pay | Admitting: Family Medicine

## 2019-03-06 VITALS — BP 110/73 | HR 69 | Temp 99.0°F | Resp 16 | Ht 60.0 in | Wt 104.0 lb

## 2019-03-06 DIAGNOSIS — L02412 Cutaneous abscess of left axilla: Secondary | ICD-10-CM

## 2019-03-06 MED ORDER — SULFAMETHOXAZOLE-TRIMETHOPRIM 800-160 MG PO TABS: 1.0000 | ORAL_TABLET | Freq: Two times a day (BID) | ORAL | 0 refills | Status: AC

## 2019-03-06 MED FILL — SULFAMETHOXAZOLE-TMP DS TAB: 800-160 | 10 days supply | Qty: 20 | Fill #0

## 2019-03-06 NOTE — Progress Notes (Signed)
  [facility]   Progress Note: Sick Visit Provider: Lanae Boast, FNP  SUBJECTIVE:   [name] is a 43 y.o. female who  has a past medical history of Hypertension and Left shoulder pain.. Patient presents today for Epidermal Cyst (under left arm pit. painful and swollen ) Patient with abscess to the left axilla x 3 days. She has been placing warm compresses to the area with slight relief. There is mild sanguinous drainage intermittently. No fever, chills or night sweats.  Review of Systems  Skin:       Abscess to left axilla   All other systems reviewed and are negative.    OBJECTIVE: BP 110/73 (BP Location: Left Arm, Patient Position: Sitting, Cuff Size: Normal)   Pulse 69   Temp 99 F (37.2 C) (Oral)   Resp 16   Ht 5' (1.524 m)   Wt 104 lb (47.2 kg)   SpO2 100%   BMI 20.31 kg/m   Wt Readings from Last 3 Encounters:  03/06/19 104 lb (47.2 kg)  01/09/19 107 lb (48.5 kg)  09/19/18 108 lb (49 kg)     Physical Exam Vitals signs and nursing note reviewed.  Constitutional:      General: She is not in acute distress.    Appearance: Normal appearance. She is normal weight.  Skin:    Findings: Abscess: non indurated with mild exudate noted. Left axilla.  Neurological:     Mental Status: She is alert and oriented to person, place, and time.  Psychiatric:        Mood and Affect: Mood normal.        Behavior: Behavior normal.        Thought Content: Thought content normal.        Judgment: Judgment normal.     ASSESSMENT/PLAN:  1. Abscess of axilla, left Continue with warm compresses. Add bactrim.  - sulfamethoxazole-trimethoprim (BACTRIM DS) 800-160 MG tablet; Take 1 tablet by mouth 2 (two) times daily for 10 days.  Dispense: 20 tablet; Refill: 0         The patient was given clear instructions to go to ER or return to medical center if symptoms do not improve, worsen or new problems develop. The patient  verbalized understanding and agreed with plan of care.   [name]. Suzanne Canary, FNP-BC [facility] [address] [place], [place] [address] [phone]     This note has been created with Dragon speech recognition software and smart phrase technology. Any transcriptional errors are unintentional.

## 2019-03-06 NOTE — Patient Instructions (Signed)

## 2019-03-09 ENCOUNTER — Emergency Department (HOSPITAL_COMMUNITY)
Admission: EM | Admit: 2019-03-09 | Discharge: 2019-03-09 | Disposition: A | Discharge status: Home / Self Care | Payer: Medicaid Other | Attending: Emergency Medicine | Admitting: Emergency Medicine

## 2019-03-09 ENCOUNTER — Other Ambulatory Visit: Payer: Self-pay

## 2019-03-09 ENCOUNTER — Encounter (HOSPITAL_COMMUNITY): Payer: Self-pay | Admitting: *Deleted

## 2019-03-09 DIAGNOSIS — F17210 Nicotine dependence, cigarettes, uncomplicated: Secondary | ICD-10-CM | POA: Insufficient documentation

## 2019-03-09 DIAGNOSIS — I10 Essential (primary) hypertension: Secondary | ICD-10-CM | POA: Diagnosis not present

## 2019-03-09 DIAGNOSIS — L729 Follicular cyst of the skin and subcutaneous tissue, unspecified: Secondary | ICD-10-CM

## 2019-03-09 DIAGNOSIS — Z79899 Other long term (current) drug therapy: Secondary | ICD-10-CM | POA: Insufficient documentation

## 2019-03-09 DIAGNOSIS — L02414 Cutaneous abscess of left upper limb: Secondary | ICD-10-CM | POA: Diagnosis not present

## 2019-03-09 DIAGNOSIS — L02412 Cutaneous abscess of left axilla: Secondary | ICD-10-CM | POA: Insufficient documentation

## 2019-03-09 DIAGNOSIS — L089 Local infection of the skin and subcutaneous tissue, unspecified: Secondary | ICD-10-CM

## 2019-03-09 MED ORDER — LIDOCAINE HCL (PF) 1 % IJ SOLN
5.0000 mL | Freq: Once | INTRAMUSCULAR | Status: AC
  Administered 2019-03-09: 5 mL
  Filled 2019-03-09: qty 5

## 2019-03-09 MED ORDER — HYDROCODONE-ACETAMINOPHEN 5-325 MG PO TABS
1.0000 | ORAL_TABLET | Freq: Once | ORAL | Status: AC
  Administered 2019-03-09: 1 via ORAL
  Filled 2019-03-09: qty 1

## 2019-03-09 NOTE — ED Triage Notes (Signed)
Pt went to PCP and was started on Anti -Bx . Pt reports she came today because the abscess stopped draining and feels raw. Pt reports she started on Anti-bx on Tuesday.

## 2019-03-09 NOTE — Discharge Instructions (Addendum)
Take the antibiotics as prescribed.  Follow-up with PCP in 2 days for wound recheck.  If you develop fever, worsening redness or swelling please seek reevaluation.

## 2019-03-09 NOTE — ED Provider Notes (Signed)
[facility]   CSN: [id number] Arrival date & time: 03/09/19  1438   History   Chief Complaint Chief Complaint  Patient presents with  . Recurrent Skin Infections    HPI [name] is a 43 y.o. female with past medical history significant for hypertension, recurrent abscesses who presents for evaluation of recurrent abscess.  Patient with abscess to left axilla x4 days.  Was seen by PCP office 3 days ago and started on Bactrim.  At that time abscess was draining.  Patient states abscess close up yesterday and has doubled in size.  She has tenderness palpation which she rates a 9/10.  Denies fever, chills, nausea, vomiting, chest pain, shortness of breath, decreased range of motion, numbness or tingling to her extremities.  Denies additional aggravating or alleviating factors.  Patient states she does have history of recurrent abscesses and infected cyst. Denies additional aggravating or alleviating factors.  History obtained from patient and past medical records.  No interpreter was used.     HPI  Past Medical History:  Diagnosis Date  . Hypertension   . Left shoulder pain     Patient Active Problem List   Diagnosis Date Noted  . Radiculopathy of cervicothoracic region 11/22/2016  . Essential hypertension 11/21/2016  . Injury of left shoulder 08/04/2016  . Muscle spasm of left shoulder 12/31/2015  . Left shoulder pain 12/31/2015  . Tobacco dependence 12/31/2015    Past Surgical History:  Procedure Laterality Date  . CESAREAN SECTION    . TUBAL LIGATION       OB History   No obstetric history on file.      Home Medications    Prior to Admission medications   Medication Sig Start Date End Date Taking? Authorizing Provider  acetaminophen (TYLENOL) 500 MG tablet Take 1 tablet (500 mg total) by mouth every 6 (six) hours as needed. 01/08/17   Law, Waylan BogaAlexandra M, PA-C  amLODipine (NORVASC) 5 MG tablet Take 1  tablet (5 mg total) by mouth daily. 09/19/18   Mike Gipouglas, Andre, FNP  cetirizine (ZYRTEC) 10 MG tablet Take 1 tablet (10 mg total) by mouth daily. 12/27/17   Massie MaroonHollis, Lachina M, FNP  lidocaine (LIDODERM) 5 % Place 1 patch onto the skin daily. Remove & Discard patch within 12 hours or as directed by MD    [provider]  methocarbamol (ROBAXIN) 500 MG tablet Take 1 tablet (500 mg total) by mouth 2 (two) times daily. 01/07/19   Mike Gipouglas, Andre, FNP  sulfamethoxazole-trimethoprim (BACTRIM DS) 800-160 MG tablet Take 1 tablet by mouth 2 (two) times daily for 10 days. 03/06/19 03/16/19  Mike Gipouglas, Andre, FNP    Family History Family History  Problem Relation Age of Onset  . Cancer Father     Social History Social History   Tobacco Use  . Smoking status: Current Every Day Smoker    Packs/day: 1.00    Years: 15.00    Pack years: 15.00    Types: Cigarettes  . Smokeless tobacco: Never Used  . Tobacco comment: down to a half pack  Substance Use Topics  . Alcohol use: No  . Drug use: No     Allergies   Patient has no known allergies.   Review of Systems Review of Systems  Constitutional: Negative.   HENT: Negative.   Respiratory: Negative.   Cardiovascular: Negative.   Gastrointestinal: Negative.   Genitourinary: Negative.   Musculoskeletal: Negative.   Skin: Positive for wound.  Neurological: Negative.   All other systems reviewed and are negative.    Physical Exam Updated Vital Signs BP 111/80 (BP Location: Right Arm)   Pulse 94   Temp 99.4 F (37.4 C) (Oral)   Resp 18   Ht 5' (1.524 m)   Wt 47.2 kg   LMP 03/09/2019   SpO2 98%   BMI 20.31 kg/m   Physical Exam Vitals signs and nursing note reviewed.  Constitutional:      General: She is not in acute distress.    Appearance: She is well-developed. She is not ill-appearing, toxic-appearing or diaphoretic.     Comments: Talking on phone drinking in room initial evaluation.  No acute distress noted.  HENT:      Head: Atraumatic.  Eyes:     Pupils: Pupils are equal, round, and reactive to light.  Neck:     Musculoskeletal: Normal range of motion.  Cardiovascular:     Rate and Rhythm: Normal rate.     Pulses: Normal pulses.     Heart sounds: Normal heart sounds.  Pulmonary:     Effort: Pulmonary effort is normal. No respiratory distress.     Breath sounds: Normal breath sounds.  Abdominal:     General: Bowel sounds are normal. There is no distension.  Musculoskeletal: Normal range of motion.     Right shoulder: Normal.     Cervical back: Normal.     Left forearm: Normal.     Comments: Moves all 4 extremities without difficulty.    Skin:    General: Skin is warm and dry.     Capillary Refill: Capillary refill takes less than 2 seconds.     Findings: Abscess present.     Comments: 2 cm area of elevated, rounded fluctuance to left axilla.  No surrounding erythema or warmth.  Mild surrounding induration.  Neurological:     Mental Status: She is alert.      ED Treatments / Results  Labs (all labs ordered are listed, but only abnormal results are displayed) Labs Reviewed - No data to display  EKG None  Radiology No results found.  Procedures .Marland KitchenIncision and Drainage  Date/Time: 03/09/2019 3:50 PM Performed by: Nettie Elm, PA-C Authorized by: Nettie Elm, PA-C   Consent:    Consent obtained:  Verbal   Consent given by:  Patient   Risks discussed:  Bleeding, incomplete drainage, pain and damage to other organs   Alternatives discussed:  No treatment Universal protocol:    Procedure explained and questions answered to patient or proxy's satisfaction: yes     Relevant documents present and verified: yes     Test results available and properly labeled: yes     Imaging studies available: yes     Required blood products, implants, devices, and special equipment available: yes     Site/side marked: yes     Immediately prior to procedure a time out was called: yes      Patient identity confirmed:  Verbally with patient Location:    Type:  Cyst (infected cyst)   Location:  Upper extremity   Upper extremity location:  Arm   Arm location:  L upper arm (axilla) Pre-procedure details:    Skin preparation:  Betadine Anesthesia (see MAR for exact dosages):    Anesthesia method:  Local infiltration   Local anesthetic:  Lidocaine 1% w/o epi Procedure type:    Complexity:  Complex Procedure details:    Incision types:  Single straight  Incision depth:  Subcutaneous   Scalpel blade:  11   Wound management:  Probed and deloculated, irrigated with saline and extensive cleaning   Drainage:  Purulent   Drainage amount:  Moderate   Wound treatment:  Drain placed   Packing materials:  1/4 in gauze Post-procedure details:    Patient tolerance of procedure:  Tolerated well, no immediate complications   (including critical care time)  Medications Ordered in ED Medications  HYDROcodone-acetaminophen (NORCO/VICODIN) 5-325 MG per tablet 1 tablet (has no administration in time range)  lidocaine (PF) (XYLOCAINE) 1 % injection 5 mL (5 mLs Infiltration Given 03/09/19 1528)   Initial Impression / Assessment and Plan / ED Course  I have reviewed the triage vital signs and the nursing notes.  Pertinent labs & imaging results that were available during my care of the patient were reviewed by me and considered in my medical decision making (see chart for details).  43 year old female appears otherwise well presents for evaluation of skin infection to her left axilla.  She is afebrile, nonseptic, non-ill-appearing.  Patient is 1.5-2 cm rounded area of fluctuance to her left axilla.  No surrounding areas of cellulitis.  Neuromusculoskeletal exam.  She has no systemic symptoms.  Started on Bactrim by PCP 2 days ago.  Area opened and drained.  See procedure note.  There was mild cystic drainage as well as purulent drainage to area.  Consistent with infected cyst.  Given this area  is recurrently becoming infected do feel she would follow-up best with general surgery to have pocket removed.  Discussed warm compress and continuing on Bactrim.  Patient to return in 2 days for wound recheck.  The patient has been appropriately medically screened and/or stabilized in the ED. I have low suspicion for any other emergent medical condition which would require further screening, evaluation or treatment in the ED or require inpatient management.       Final Clinical Impressions(s) / ED Diagnoses   Final diagnoses:  Infected cyst of skin    ED Discharge Orders    None       Darianny Momon A, PA-C 03/09/19 1552    Alvira MondaySchlossman, Erin, MD 03/12/19 1604

## 2019-03-09 NOTE — ED Notes (Signed)
Patient verbalizes understanding of discharge instructions. Opportunity for questioning and answers were provided. Armband removed by staff, pt discharged from ED.  

## 2019-03-11 ENCOUNTER — Telehealth: Payer: Self-pay | Admitting: Family Medicine

## 2019-03-12 ENCOUNTER — Other Ambulatory Visit: Payer: Self-pay

## 2019-03-12 ENCOUNTER — Encounter: Payer: Self-pay | Admitting: Family Medicine

## 2019-03-12 ENCOUNTER — Ambulatory Visit (INDEPENDENT_AMBULATORY_CARE_PROVIDER_SITE_OTHER): Payer: Medicaid Other | Admitting: Family Medicine

## 2019-03-12 DIAGNOSIS — L02412 Cutaneous abscess of left axilla: Secondary | ICD-10-CM | POA: Diagnosis not present

## 2019-03-12 NOTE — Telephone Encounter (Signed)
Patient was in the office today 03/12/2019 and questions were addressed.

## 2019-03-12 NOTE — Progress Notes (Signed)
Acute Office Visit  Subjective:    [name], female    DOB: Aug 01, 1975, 43 y.o.   MRN: [id number]  Chief Complaint  Patient presents with  . Follow-up    left underarm cyst     HPI [name], a 43 year old female with a medical history significant for essential hypertension and tobacco dependence presents for follow-up of left underarm abscess.  Patient was treated and evaluated for this problem on 03/09/2019 in the emergency department.  Patient underwent left arm I&D.  Prior to ER visit, patient stated that abscess had doubled in size.  She endorsed tenderness to palpation.  Patient states that tenderness has improved since abscess was drained.  Patient states that she does have a history of recurrent abscesses and infected cysts.  Patient was started on Bactrim by PCP and has continued consistently without interruption. She denies fever, chills, redness, abdominal pain, dysuria, nausea, vomiting, or diarrhea.  Past Medical History:  Diagnosis Date  . Hypertension   . Left shoulder pain     Past Surgical History:  Procedure Laterality Date  . CESAREAN SECTION    . TUBAL LIGATION      Family History  Problem Relation Age of Onset  . Cancer Father     Social History   Socioeconomic History  . Marital status: Single    Spouse name: Not on file  . Number of children: Not on file  . Years of education: Not on file  . Highest education level: Not on file  Occupational History  . Not on file  Social Needs  . Financial resource strain: Not on file  . Food insecurity    Worry: Not on file    Inability: Not on file  . Transportation needs    Medical: Not on file    Non-medical: Not on file  Tobacco Use  . Smoking status: Current Every Day Smoker    Packs/day: 1.00    Years: 15.00    Pack years: 15.00    Types: Cigarettes  . Smokeless tobacco: Never Used  . Tobacco comment: down to a half pack  Substance and Sexual Activity  . Alcohol use:  No  . Drug use: No  . Sexual activity: Not on file  Lifestyle  . Physical activity    Days per week: Not on file    Minutes per session: Not on file  . Stress: Not on file  Relationships  . Social Musicianconnections    Talks on phone: Not on file    Gets together: Not on file    Attends religious service: Not on file    Active member of club or organization: Not on file    Attends meetings of clubs or organizations: Not on file    Relationship status: Not on file  . Intimate partner violence    Fear of current or ex partner: Not on file    Emotionally abused: Not on file    Physically abused: Not on file    Forced sexual activity: Not on file  Other Topics Concern  . Not on file  Social History Narrative  . Not on file    Outpatient Medications Prior to Visit  Medication Sig Dispense Refill  . acetaminophen (TYLENOL) 500 MG tablet Take 1 tablet (500 mg total) by mouth every 6 (six) hours as needed. 30 tablet 0  . amLODipine (NORVASC) 5 MG tablet Take 1 tablet (5 mg total) by mouth daily. 30 tablet 5  .  cetirizine (ZYRTEC) 10 MG tablet Take 1 tablet (10 mg total) by mouth daily. 30 tablet 11  . lidocaine (LIDODERM) 5 % Place 1 patch onto the skin daily. Remove & Discard patch within 12 hours or as directed by MD    . methocarbamol (ROBAXIN) 500 MG tablet Take 1 tablet (500 mg total) by mouth 2 (two) times daily. 30 tablet 1  . sulfamethoxazole-trimethoprim (BACTRIM DS) 800-160 MG tablet Take 1 tablet by mouth 2 (two) times daily for 10 days. 20 tablet 0   No facility-administered medications prior to visit.     No Known Allergies  Review of Systems  Constitutional: Negative for fever.  HENT: Negative.   Eyes: Negative.   Respiratory: Negative for cough and hemoptysis.   Cardiovascular: Negative.   Gastrointestinal: Negative.   Genitourinary: Negative.   Musculoskeletal: Negative.   Skin:       Abscess to left axilla  Neurological: Negative for dizziness and headaches.   Endo/Heme/Allergies: Negative.   Psychiatric/Behavioral: Negative.        Objective:    Physical Exam  Constitutional: She is oriented to person, place, and time. She appears well-developed.  HENT:  Head: Normocephalic.  Eyes: Pupils are equal, round, and reactive to light.  Cardiovascular: Normal rate, regular rhythm and normal heart sounds.  Pulmonary/Chest: Effort normal and breath sounds normal.  Abdominal: Soft. Bowel sounds are normal.  Neurological: She is alert and oriented to person, place, and time.  Skin: Skin is warm and dry.  Abscess to left axilla, 70% granulation, no drainage, no erythema, no edema, tender to palpation, 1.5-2 cm.  Psychiatric: She has a normal mood and affect. Her behavior is normal. Judgment and thought content normal.    BP 106/76 (BP Location: Right Arm, Patient Position: Sitting, Cuff Size: Normal)   Pulse 82   Temp 99 F (37.2 C) (Oral)   Resp 14   Ht 5' (1.524 m)   Wt 102 lb (46.3 kg)   LMP 03/09/2019   SpO2 100%   BMI 19.92 kg/m  Wt Readings from Last 3 Encounters:  03/12/19 102 lb (46.3 kg)  03/09/19 104 lb (47.2 kg)  03/06/19 104 lb (47.2 kg)    There are no preventive care reminders to display for this patient.  There are no preventive care reminders to display for this patient.   Lab Results  Component Value Date   TSH 0.431 (L) 01/09/2019   Lab Results  Component Value Date   WBC 4.7 10/05/2017   HGB 12.4 10/05/2017   HCT 36.4 10/05/2017   MCV 97.8 10/05/2017   PLT 217 10/05/2017   Lab Results  Component Value Date   NA 139 03/15/2018   K 3.9 03/15/2018   CO2 21 03/15/2018   GLUCOSE 100 (H) 03/15/2018   BUN 14 03/15/2018   CREATININE 0.79 03/15/2018   BILITOT 0.7 03/15/2018   ALKPHOS 51 03/15/2018   AST 15 03/15/2018   ALT 22 03/15/2018   PROT 6.8 03/15/2018   ALBUMIN 4.5 03/15/2018   CALCIUM 9.0 03/15/2018   ANIONGAP 7 10/05/2017   Lab Results  Component Value Date   CHOL 112 03/15/2018   Lab  Results  Component Value Date   HDL 46 03/15/2018   Lab Results  Component Value Date   LDLCALC 56 03/15/2018   Lab Results  Component Value Date   TRIG 49 03/15/2018   Lab Results  Component Value Date   CHOLHDL 2.4 03/15/2018   Lab Results  Component Value  Date   HGBA1C 4.6 08/04/2016       Assessment & Plan:   Problem List Items Addressed This Visit      Other   Abscess of left axilla     Abscess of left axilla Patient advised to keep abscess of left axilla, clean and dry.  Leave open to air, only cover when out in public.  Continue Bactrim 800-160.  Advised to return to ER or clinic if problem persists.  Recommend Tylenol 650 mg every 4 hours as needed for mild to moderate left axilla pain.  Also, ibuprofen 600 mg every 8 hours with food alternatively.  Follow-up: Follow-up in clinic for hypertension in 3 months or as needed.   The patient was given clear instructions to go to ER or return to medical center if symptoms do not improve, worsen or new problems develop. The patient verbalized understanding.   Donia Pounds  APRN, MSN, FNP-C [facility] [address] [place], [place] [address] [phone]

## 2019-03-12 NOTE — Patient Instructions (Signed)

## 2019-03-26 ENCOUNTER — Telehealth: Payer: Self-pay

## 2019-03-26 DIAGNOSIS — I10 Essential (primary) hypertension: Secondary | ICD-10-CM

## 2019-03-27 MED ORDER — AMLODIPINE BESYLATE 5 MG PO TABS: 5.0000 mg | ORAL_TABLET | Freq: Every day | ORAL | 5 refills | Status: DC

## 2019-03-27 MED FILL — AMLODIPINE BESYLATE 5 MG TA: 5 | 90 days supply | Qty: 90 | Fill #0

## 2019-03-27 NOTE — Telephone Encounter (Signed)
This has been sent into pharmacy. Thanks!  

## 2019-04-03 ENCOUNTER — Encounter (HOSPITAL_COMMUNITY): Payer: Self-pay

## 2019-04-03 ENCOUNTER — Encounter (HOSPITAL_COMMUNITY): Payer: Self-pay | Admitting: *Deleted

## 2019-06-27 MED FILL — AMLODIPINE BESYLATE 5 MG TA: 5 | 90 days supply | Qty: 90 | Fill #1

## 2019-07-11 ENCOUNTER — Ambulatory Visit: Payer: Medicaid Other | Admitting: Family Medicine

## 2019-09-27 ENCOUNTER — Other Ambulatory Visit: Payer: Self-pay | Admitting: Family Medicine

## 2019-09-27 DIAGNOSIS — I10 Essential (primary) hypertension: Secondary | ICD-10-CM

## 2019-09-27 MED FILL — AMLODIPINE BESYLATE 5 MG TA: 5 | 30 days supply | Qty: 30 | Fill #0

## 2019-10-08 ENCOUNTER — Encounter: Payer: Self-pay | Admitting: Family Medicine

## 2019-10-08 ENCOUNTER — Other Ambulatory Visit: Payer: Self-pay

## 2019-10-08 ENCOUNTER — Other Ambulatory Visit: Payer: Self-pay | Admitting: Family Medicine

## 2019-10-08 ENCOUNTER — Ambulatory Visit (INDEPENDENT_AMBULATORY_CARE_PROVIDER_SITE_OTHER): Payer: Medicaid Other | Admitting: Family Medicine

## 2019-10-08 VITALS — BP 117/76 | HR 79 | Temp 98.8°F | Resp 16 | Ht 60.0 in | Wt 105.0 lb

## 2019-10-08 DIAGNOSIS — Z9109 Other allergy status, other than to drugs and biological substances: Secondary | ICD-10-CM

## 2019-10-08 DIAGNOSIS — I10 Essential (primary) hypertension: Secondary | ICD-10-CM | POA: Diagnosis not present

## 2019-10-08 DIAGNOSIS — F17200 Nicotine dependence, unspecified, uncomplicated: Secondary | ICD-10-CM

## 2019-10-08 LAB — POCT URINALYSIS DIPSTICK
Bilirubin, UA: NEGATIVE
Glucose, UA: NEGATIVE
Ketones, UA: NEGATIVE
Leukocytes, UA: NEGATIVE
Nitrite, UA: NEGATIVE
Protein, UA: NEGATIVE
Spec Grav, UA: <=1.005 — AB (ref 1.010–1.025)
Urobilinogen, UA: 0.2 E.U./dL
pH, UA: 6 (ref 5.0–8.0)

## 2019-10-08 MED ORDER — LORATADINE 10 MG PO TABS: 10.0000 mg | ORAL_TABLET | Freq: Every day | ORAL | 11 refills | Status: DC

## 2019-10-08 MED FILL — LORATADINE 10 MG TABLET: 10 | 30 days supply | Qty: 30 | Fill #0

## 2019-10-08 NOTE — Progress Notes (Signed)
[facility]   Established Patient Office Visit  Subjective:  [name], female    DOB: [birth year]  Age: 44 y.o. MRN: [id number]    CC:  Chief Complaint  Patient presents with  . Hypertension  . Allergies    wants rx for claritin    [name], a 44 year old female with a medical history of hypertension and tobacco dependence presents for a 6 month follow up. Patient says that she has been doing well and is without complaint.   Hypertension This is a chronic problem. The problem is controlled. Pertinent negatives include no anxiety, blurred vision, chest pain, headaches, malaise/fatigue, orthopnea, palpitations, PND or shortness of breath. Risk factors for coronary artery disease include family history. There are no compliance problems.     Past Medical History:  Diagnosis Date  . Hypertension   . Left shoulder pain     Past Surgical History:  Procedure Laterality Date  . CESAREAN SECTION    . TUBAL LIGATION      Family History  Problem Relation Age of Onset  . Cancer Father     Social History   Socioeconomic History  . Marital status: Single    Spouse name: Not on file  . Number of children: Not on file  . Years of education: Not on file  . Highest education level: Not on file  Occupational History  . Not on file  Tobacco Use  . Smoking status: Current Every Day Smoker    Packs/day: 1.00    Years: 15.00    Pack years: 15.00    Types: Cigarettes  . Smokeless tobacco: Never Used  . Tobacco comment: down to a half pack  Substance and Sexual Activity  . Alcohol use: No  . Drug use: No  . Sexual activity: Not on file  Other Topics Concern  . Not on file  Social History Narrative  . Not on file   Social Determinants of Health   Financial Resource Strain:   . Difficulty of Paying Living Expenses:   Food Insecurity:   . Worried About Programme researcher, broadcasting/film/video in the Last Year:   . Garment/textile technologist in the Last Year:   Transportation Needs:   . Freight forwarder (Medical):   Marland Kitchen Lack of Transportation (Non-Medical):   Physical Activity:   . Days of Exercise per Week:   . Minutes of Exercise per Session:   Stress:   . Feeling of Stress :   Social Connections:   . Frequency of Communication with Friends and Family:   . Frequency of Social Gatherings with Friends and Family:   . Attends Religious Services:   . Active Member of Clubs or Organizations:   . Attends Banker Meetings:   Marland Kitchen Marital Status:   Intimate Partner Violence:   . Fear of Current or Ex-Partner:   . Emotionally Abused:   Marland Kitchen Physically Abused:   . Sexually Abused:     Outpatient Medications Prior to Visit  Medication Sig Dispense Refill  . acetaminophen (TYLENOL) 500 MG tablet Take 1 tablet (500 mg total) by mouth every 6 (six) hours as needed. 30 tablet 0  . amLODipine (NORVASC) 5 MG tablet TAKE 1 TABLET (5 MG TOTAL) BY MOUTH DAILY. 90 tablet 5  . lidocaine (LIDODERM) 5 % Place 1 patch onto the skin daily. Remove & Discard patch within 12 hours or as directed by MD    .  cetirizine (ZYRTEC) 10 MG tablet Take 1 tablet (10 mg total) by mouth daily. (Patient not taking: Reported on 10/08/2019) 30 tablet 11  . methocarbamol (ROBAXIN) 500 MG tablet Take 1 tablet (500 mg total) by mouth 2 (two) times daily. (Patient not taking: Reported on 10/08/2019) 30 tablet 1   No facility-administered medications prior to visit.    No Known Allergies  ROS Review of Systems  Constitutional: Negative for fatigue and malaise/fatigue.  HENT: Negative.   Eyes: Negative for blurred vision.  Respiratory: Negative.  Negative for shortness of breath.   Cardiovascular: Negative.  Negative for chest pain, palpitations, orthopnea and PND.  Gastrointestinal: Negative.   Endocrine: Negative.   Genitourinary: Negative.   Musculoskeletal: Negative.   Skin: Negative.   Allergic/Immunologic: Positive for  environmental allergies.  Neurological: Negative.  Negative for headaches.  Hematological: Negative.   Psychiatric/Behavioral: Negative.       Objective:    Physical Exam  Constitutional: She appears well-developed and well-nourished.  HENT:  Head: Normocephalic.  Eyes: Pupils are equal, round, and reactive to light.  Cardiovascular: Normal rate and regular rhythm.  Pulmonary/Chest: Effort normal and breath sounds normal.  Abdominal: Soft.  Musculoskeletal:        General: Normal range of motion.     Cervical back: Normal range of motion.  Neurological: She is alert.  Skin: Skin is warm.  Psychiatric: She has a normal mood and affect. Her behavior is normal. Judgment and thought content normal.    BP 117/76 (BP Location: Right Arm, Patient Position: Sitting, Cuff Size: Normal)   Pulse 79   Temp 98.8 F (37.1 C) (Oral)   Resp 16   Ht 5' (1.524 m)   Wt 105 lb (47.6 kg)   LMP 09/17/2019   SpO2 99%   BMI 20.51 kg/m  Wt Readings from Last 3 Encounters:  10/08/19 105 lb (47.6 kg)  03/12/19 102 lb (46.3 kg)  03/09/19 104 lb (47.2 kg)     There are no preventive care reminders to display for this patient.  There are no preventive care reminders to display for this patient.  Lab Results  Component Value Date   TSH 0.431 (L) 01/09/2019   Lab Results  Component Value Date   WBC 4.7 10/05/2017   HGB 12.4 10/05/2017   HCT 36.4 10/05/2017   MCV 97.8 10/05/2017   PLT 217 10/05/2017   Lab Results  Component Value Date   NA 139 03/15/2018   K 3.9 03/15/2018   CO2 21 03/15/2018   GLUCOSE 100 (H) 03/15/2018   BUN 14 03/15/2018   CREATININE 0.79 03/15/2018   BILITOT 0.7 03/15/2018   ALKPHOS 51 03/15/2018   AST 15 03/15/2018   ALT 22 03/15/2018   PROT 6.8 03/15/2018   ALBUMIN 4.5 03/15/2018   CALCIUM 9.0 03/15/2018   ANIONGAP 7 10/05/2017   Lab Results  Component Value Date   CHOL 112 03/15/2018   Lab Results  Component Value Date   HDL 46 03/15/2018    Lab Results  Component Value Date   LDLCALC 56 03/15/2018   Lab Results  Component Value Date   TRIG 49 03/15/2018   Lab Results  Component Value Date   CHOLHDL 2.4 03/15/2018   Lab Results  Component Value Date   HGBA1C 4.6 08/04/2016      BP 117/76 (BP Location: Right Arm, Patient Position: Sitting, Cuff Size: Normal)   Pulse 79   Temp 98.8 F (37.1 C) (Oral)   Resp 16  Ht 5' (1.524 m)   Wt 105 lb (47.6 kg)   LMP 09/17/2019   SpO2 99%   BMI 20.51 kg/m  Assessment & Plan:   Problem List Items Addressed This Visit      Cardiovascular and Mediastinum   Essential hypertension - Primary   Relevant Orders   Urinalysis Dipstick      Essential hypertension Blood pressure is at goal, no medication changes warranted on today.  - Continue medication, monitor blood pressure at home. Continue DASH diet. Reminder to go to the ER if any CP, SOB, nausea, dizziness, severe HA, changes vision/speech, left arm numbness and tingling and jaw pain.  - Urinalysis Dipstick - Basic Metabolic Panel  Tobacco dependence Smoking cessation instruction/counseling given:  counseled patient on the dangers of tobacco use, advised patient to stop smoking, and reviewed strategies to maximize success    Environmental allergies - loratadine (CLARITIN) 10 MG tablet; Take 1 tablet (10 mg total) by mouth daily.  Dispense: 30 tablet; Refill: 11  Follow-up: Return in about 6 months (around 04/09/2020) for hypertension.     Nolon Nations  APRN, MSN, FNP-C [facility] [address] [place], [place] [address] [phone]

## 2019-10-09 ENCOUNTER — Telehealth: Payer: Self-pay

## 2019-10-09 LAB — BASIC METABOLIC PANEL
BUN/Creatinine Ratio: 11 (ref 9–23)
BUN: 10 mg/dL (ref 6–24)
CO2: 22 mmol/L (ref 20–29)
Calcium: 8.9 mg/dL (ref 8.7–10.2)
Chloride: 103 mmol/L (ref 96–106)
Creatinine, Ser: 0.93 mg/dL (ref 0.57–1.00)
GFR calc Af Amer: 87 mL/min/{1.73_m2} (ref >59)
GFR calc non Af Amer: 76 mL/min/{1.73_m2} (ref >59)
Glucose: 81 mg/dL (ref 65–99)
Potassium: 3.7 mmol/L (ref 3.5–5.2)
Sodium: 139 mmol/L (ref 134–144)

## 2019-10-09 NOTE — Telephone Encounter (Signed)
Called, no answer. Left a message that all labs are within a normal range. Asked that she continue to eat a dash diet, take medications, and monitor blood pressure at home. Reminded if she has any CP, SOB, nausea, dizziness, severe HA, changes in vision/speech, left arm numbness or tingling and jaw pain to go to ER ASAP. Asked if any questions to call back to our office. Thanks!

## 2019-10-09 NOTE — Telephone Encounter (Signed)
-----   Message from Massie Maroon, [place] sent at 10/09/2019  9:55 AM EDT ----- Regarding: lab results Please inform patient that all labs are within a normal range. - Continue medication, monitor blood pressure at home. Continue DASH diet. Reminder to go to the ER if any CP, SOB, nausea, dizziness, severe HA, changes vision/speech, left arm numbness and tingling and jaw pain.   Nolon Nations  APRN, MSN, FNP-C Patient Care [facility] [address] [place], [place] [address] [phone]

## 2019-10-28 MED FILL — AMLODIPINE BESYLATE 5 MG TA: 5 | 30 days supply | Qty: 30 | Fill #1

## 2019-11-12 MED FILL — LORATADINE 10 MG TABLET: 10 | 30 days supply | Qty: 30 | Fill #1

## 2019-11-26 MED FILL — AMLODIPINE BESYLATE 5 MG TA: 5 | 90 days supply | Qty: 90 | Fill #2

## 2019-12-11 MED FILL — LORATADINE 10 MG TABLET: 10 | 30 days supply | Qty: 30 | Fill #2

## 2020-01-13 MED FILL — LORATADINE 10 MG TABLET: 10 | 30 days supply | Qty: 30 | Fill #3

## 2020-02-13 MED FILL — LORATADINE 10 MG TABLET: 10 | 30 days supply | Qty: 30 | Fill #4

## 2020-02-25 MED FILL — AMLODIPINE BESYLATE 5 MG TA: 5 | 90 days supply | Qty: 90 | Fill #3

## 2020-03-17 MED FILL — LORATADINE 10 MG TABLET: 10 | 30 days supply | Qty: 30 | Fill #5

## 2020-04-07 ENCOUNTER — Ambulatory Visit: Payer: Medicaid Other | Admitting: Family Medicine

## 2020-04-15 MED FILL — LORATADINE 10 MG TABLET: 10 | 30 days supply | Qty: 30 | Fill #6

## 2020-05-26 MED FILL — LORATADINE 10 MG TABLET: 10 | 30 days supply | Qty: 30 | Fill #7

## 2020-05-26 MED FILL — AMLODIPINE BESYLATE 5 MG TA: 5 | 90 days supply | Qty: 90 | Fill #4

## 2020-06-16 ENCOUNTER — Encounter: Payer: Self-pay | Admitting: Family Medicine

## 2020-06-16 ENCOUNTER — Ambulatory Visit (INDEPENDENT_AMBULATORY_CARE_PROVIDER_SITE_OTHER): Payer: Medicaid Other | Admitting: Family Medicine

## 2020-06-16 ENCOUNTER — Other Ambulatory Visit: Payer: Self-pay

## 2020-06-16 VITALS — BP 128/88 | HR 70 | Temp 97.7°F | Resp 17 | Ht 64.0 in | Wt 107.4 lb

## 2020-06-16 DIAGNOSIS — Z23 Encounter for immunization: Secondary | ICD-10-CM

## 2020-06-16 DIAGNOSIS — F17200 Nicotine dependence, unspecified, uncomplicated: Secondary | ICD-10-CM | POA: Diagnosis not present

## 2020-06-16 DIAGNOSIS — Z9109 Other allergy status, other than to drugs and biological substances: Secondary | ICD-10-CM | POA: Diagnosis not present

## 2020-06-16 DIAGNOSIS — I10 Essential (primary) hypertension: Secondary | ICD-10-CM | POA: Diagnosis not present

## 2020-06-16 LAB — POCT URINALYSIS DIPSTICK
Bilirubin, UA: NEGATIVE
Glucose, UA: NEGATIVE
Ketones, UA: NEGATIVE
Leukocytes, UA: NEGATIVE
Nitrite, UA: NEGATIVE
Protein, UA: NEGATIVE
Spec Grav, UA: >=1.03 — AB (ref 1.010–1.025)
Urobilinogen, UA: 0.2 E.U./dL
pH, UA: 6 (ref 5.0–8.0)

## 2020-06-16 MED ORDER — AMLODIPINE BESYLATE 5 MG PO TABS
5.0000 mg | ORAL_TABLET | Freq: Every day | ORAL | 5 refills | Status: DC
  Filled 2020-11-20: qty 90, 90d supply, fill #0
  Filled 2021-02-11: qty 90, 90d supply, fill #1
  Filled 2021-05-21 – 2021-05-24 (×2): qty 90, 90d supply, fill #2

## 2020-06-16 MED ORDER — LORATADINE 10 MG PO TABS
10.0000 mg | ORAL_TABLET | Freq: Every day | ORAL | 11 refills | Status: DC
  Filled 2020-11-20: qty 30, 30d supply, fill #0
  Filled 2021-01-07: qty 30, 30d supply, fill #1
  Filled 2021-02-11: qty 30, 30d supply, fill #2
  Filled 2021-03-12 – 2021-03-24 (×2): qty 30, 30d supply, fill #3
  Filled 2021-04-27: qty 30, 30d supply, fill #4
  Filled 2021-05-21 – 2021-05-24 (×2): qty 30, 30d supply, fill #5

## 2020-06-16 NOTE — Patient Instructions (Addendum)
Patient will go to [facility] to get vaccination   Influenza vaccination given without complication      Preventing Influenza, Adult Influenza, more commonly known as "the flu," is a viral infection that mainly affects the respiratory tract. The respiratory tract includes structures that help you breathe, such as the lungs, nose, and throat. The flu causes many common cold symptoms, as well as a high fever and body aches. The flu spreads easily from person to person (is contagious). The flu is most common from December through March. This is called flu season.You can catch the flu virus by:  Breathing in droplets from an infected person's cough or sneeze.  Touching something that was recently contaminated with the virus and then touching your mouth, nose, or eyes. What can I do to lower my risk?        You can decrease your risk of getting the flu by:  Getting a flu shot (influenza vaccination) every year. This is the best way to prevent the flu. A flu shot is recommended for everyone age 32 months and older. ? It is best to get a flu shot in the fall, as soon as it is available. Getting a flu shot during winter or spring instead is still a good idea. Flu season can last into early spring. ? Preventing the flu through vaccination requires getting a new flu shot every year. This is because the flu virus changes slightly (mutates) from one year to the next. Even if a flu shot does not completely protect you from all flu virus mutations, it can reduce the severity of your illness and prevent dangerous complications of the flu. ? If you are pregnant, you can and should get a flu shot. ? If you have had a reaction to the shot in the past or if you are allergic to eggs, check with your health care provider before getting a flu shot. ? Sometimes the vaccine is available as a nasal spray. In some years, the nasal spray has not been as effective against the flu virus. Check with your health care provider  if you have questions about this.  Practicing good health habits. This is especially important during flu season. ? Avoid contact with people who are sick with flu or cold symptoms. ? Wash your hands with soap and water often. If soap and water are not available, use alcohol-based hand sanitizer. ? Avoid touching your hands to your face, especially when you have not washed your hands recently. ? Use a disinfectant to clean surfaces at home and at work that may be contaminated with the flu virus. ? Keep your body's disease-fighting system (immune system) in good shape by eating a healthy diet, drinking plenty of fluids, getting enough sleep, and exercising regularly. If you do get the flu, avoid spreading it to others by:  Staying home until your symptoms have been gone for at least one day.  Covering your mouth and nose when you cough or sneeze.  Avoiding close contact with others, especially babies and elderly people. Why are these changes important? Getting a flu shot and practicing good health habits protects you as well as other people. If you get the flu, your friends, family, and co-workers are also at risk of getting it, because it spreads so easily to others. Each year, about 2 out of every 10 people get the flu. Having the flu can lead to complications, such as pneumonia, ear infection, and sinus infection. The flu also can be deadly,  especially for babies, people older than age 20, and people who have serious long-term diseases. How is this treated? Most people recover from the flu by resting at home and drinking plenty of fluids. However, a prescription antiviral medicine may reduce your flu symptoms and may make your flu go away sooner. This medicine must be started within a few days of getting flu symptoms. You can talk with your health care provider about whether you need an antiviral medicine. Antiviral medicine may be prescribed for people who are at risk for more serious flu  symptoms. This includes people who:  Are older than age 9.  Are pregnant.  Have a condition that makes the flu worse or more dangerous. Where to find more information  Centers for Disease Control and Prevention: tsavxtf.com  ItsBlog.fr: InternetEnthusiasts.hu  American Academy of Family Physicians: familydoctor.org/familydoctor/en/kids/vaccines/preventing-the-flu.html Contact a health care provider if:  You have influenza and you develop new symptoms.  You have: ? Chest pain. ? Diarrhea. ? A fever.  Your cough gets worse, or you produce more mucus. Summary  The best way to prevent the flu is to get a flu shot every year in the fall.  Even if you get the flu after you have received the yearly vaccine, your flu may be milder and go away sooner because of your flu shot.  If you get the flu, antiviral medicines that are started with a few days of symptoms may reduce your flu symptoms and may make your flu go away sooner.  You can also help prevent the flu by practicing good health habits. This information is not intended to replace advice given to you by your health care provider. Make sure you discuss any questions you have with your health care provider. Document Revised: 06/23/2017 Document Reviewed: 03/19/2016 Elsevier Patient Education  2020 [employer].

## 2020-06-16 NOTE — Progress Notes (Signed)
[facility]    Subjective:  [name], female    DOB: 08-19-1975  Age: 44 y.o. MRN: [id number]  CC:  Chief Complaint  Patient presents with  . Follow-up    Pt states she is concerned about losing her job because she has not  had the covid- 19 vaccine.    HPI [name] is a very pleasant 44 year old female with a medical history significant for essential hypertension and tobacco dependence presents for follow-up of chronic conditions.  Patient says that she has been doing well and taking all medications consistently.  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  She continues to be a chronic everyday tobacco user.  She has attempted to quit smoking in the past without success.  Patient is very active and typically follows a low fat diet.  She denies any headaches, blurred vision, chest pain, shortness of breath, urinary symptoms, nausea, vomiting, or diarrhea. Patient is inquiring about vaccinations on today.  She has not been vaccinated against influenza or COVID-19 infection.  Past Medical History:  Diagnosis Date  . Hypertension   . Left shoulder pain     Past Surgical History:  Procedure Laterality Date  . CESAREAN SECTION    . TUBAL LIGATION      Family History  Problem Relation Age of Onset  . Cancer Father     Social History   Socioeconomic History  . Marital status: Single    Spouse name: Not on file  . Number of children: Not on file  . Years of education: Not on file  . Highest education level: Not on file  Occupational History  . Not on file  Tobacco Use  . Smoking status: Current Every Day Smoker    Packs/day: 1.00    Years: 15.00    Pack years: 15.00    Types: Cigarettes  . Smokeless tobacco: Never Used  . Tobacco comment: down to a half pack  Vaping Use  . Vaping Use: Never used  Substance and Sexual Activity  . Alcohol use: No  . Drug use: No  .  Sexual activity: Not on file  Other Topics Concern  . Not on file  Social History Narrative  . Not on file   Social Determinants of Health   Financial Resource Strain:   . Difficulty of Paying Living Expenses: Not on file  Food Insecurity:   . Worried About Programme researcher, broadcasting/film/video in the Last Year: Not on file  . Ran Out of Food in the Last Year: Not on file  Transportation Needs:   . Lack of Transportation (Medical): Not on file  . Lack of Transportation (Non-Medical): Not on file  Physical Activity:   . Days of Exercise per Week: Not on file  . Minutes of Exercise per Session: Not on file  Stress:   . Feeling of Stress : Not on file  Social Connections:   . Frequency of Communication with Friends and Family: Not on file  . Frequency of Social Gatherings with Friends and Family: Not on file  . Attends Religious Services: Not on file  . Active Member of Clubs or Organizations: Not on file  . Attends Banker Meetings: Not on file  . Marital Status: Not on file  Intimate Partner Violence:   . Fear of Current or Ex-Partner: Not on file  . Emotionally Abused: Not on  file  . Physically Abused: Not on file  . Sexually Abused: Not on file    Outpatient Medications Prior to Visit  Medication Sig Dispense Refill  . acetaminophen (TYLENOL) 500 MG tablet Take 1 tablet (500 mg total) by mouth every 6 (six) hours as needed. 30 tablet 0  . amLODipine (NORVASC) 5 MG tablet TAKE 1 TABLET (5 MG TOTAL) BY MOUTH DAILY. 90 tablet 5  . cetirizine (ZYRTEC) 10 MG tablet Take 1 tablet (10 mg total) by mouth daily. 30 tablet 11  . lidocaine (LIDODERM) 5 % Place 1 patch onto the skin daily. Remove & Discard patch within 12 hours or as directed by MD    . loratadine (CLARITIN) 10 MG tablet Take 1 tablet (10 mg total) by mouth daily. 30 tablet 11  . methocarbamol (ROBAXIN) 500 MG tablet Take 1 tablet (500 mg total) by mouth 2 (two) times daily. 30 tablet 1   No facility-administered  medications prior to visit.    No Known Allergies  ROS Review of Systems  Constitutional: Negative for activity change and appetite change.  HENT: Negative.   Eyes: Negative.   Respiratory: Negative.   Cardiovascular: Negative.   Gastrointestinal: Negative.   Endocrine: Negative for polydipsia, polyphagia and polyuria.  Genitourinary: Negative.   Musculoskeletal: Negative.   Skin: Negative.   Hematological: Negative.   Psychiatric/Behavioral: Negative.       Objective:    Physical Exam Constitutional:      Appearance: Normal appearance.  HENT:     Nose: Nose normal.  Eyes:     Pupils: Pupils are equal, round, and reactive to light.  Cardiovascular:     Rate and Rhythm: Normal rate and regular rhythm.     Pulses: Normal pulses.     Heart sounds: Normal heart sounds.  Pulmonary:     Effort: Pulmonary effort is normal.     Breath sounds: Normal breath sounds.  Abdominal:     General: Abdomen is flat. Bowel sounds are normal.  Skin:    General: Skin is warm.  Neurological:     General: No focal deficit present.     Mental Status: She is alert and oriented to person, place, and time. Mental status is at baseline.  Psychiatric:        Mood and Affect: Mood normal.        Behavior: Behavior normal.        Thought Content: Thought content normal.        Judgment: Judgment normal.     BP 128/88 (BP Location: Right Arm, Patient Position: Sitting, Cuff Size: Normal)   Pulse 70   Temp 97.7 F (36.5 C)   Resp 17   Ht 5\' 4"  (1.626 m)   Wt 107 lb 6.4 oz (48.7 kg)   LMP 05/29/2020 (Exact Date)   SpO2 100%   BMI 18.44 kg/m  Wt Readings from Last 3 Encounters:  06/16/20 107 lb 6.4 oz (48.7 kg)  10/08/19 105 lb (47.6 kg)  03/12/19 102 lb (46.3 kg)     Health Maintenance Due  Topic Date Due  . Hepatitis C Screening  Never done  . COVID-19 Vaccine (1) Never done    There are no preventive care reminders to display for this patient.  Lab Results  Component  Value Date   TSH 0.431 (L) 01/09/2019   Lab Results  Component Value Date   WBC 4.7 10/05/2017   HGB 12.4 10/05/2017   HCT 36.4 10/05/2017   MCV 97.8  10/05/2017   PLT 217 10/05/2017   Lab Results  Component Value Date   NA 139 10/08/2019   K 3.7 10/08/2019   CO2 22 10/08/2019   GLUCOSE 81 10/08/2019   BUN 10 10/08/2019   CREATININE 0.93 10/08/2019   BILITOT 0.7 03/15/2018   ALKPHOS 51 03/15/2018   AST 15 03/15/2018   ALT 22 03/15/2018   PROT 6.8 03/15/2018   ALBUMIN 4.5 03/15/2018   CALCIUM 8.9 10/08/2019   ANIONGAP 7 10/05/2017   Lab Results  Component Value Date   CHOL 112 03/15/2018   Lab Results  Component Value Date   HDL 46 03/15/2018   Lab Results  Component Value Date   LDLCALC 56 03/15/2018   Lab Results  Component Value Date   TRIG 49 03/15/2018   Lab Results  Component Value Date   CHOLHDL 2.4 03/15/2018   Lab Results  Component Value Date   HGBA1C 4.6 08/04/2016      Assessment & Plan:   Problem List Items Addressed This Visit      Cardiovascular and Mediastinum   Essential hypertension - Primary   Relevant Medications   amLODipine (NORVASC) 5 MG tablet   Other Relevant Orders   Urinalysis Dipstick (Completed)   Ambulatory referral to Ophthalmology   Basic Metabolic Panel   Lipid Panel     Other   Tobacco dependence    Other Visit Diagnoses    Need for influenza vaccination       Relevant Orders   Flu Vaccine QUAD 36+ mos IM (Fluarix, Fluzone & Afluria Quad PF   Environmental allergies       Relevant Medications   loratadine (CLARITIN) 10 MG tablet      1. Essential hypertension BP 128/88 (BP Location: Right Arm, Patient Position: Sitting, Cuff Size: Normal)   Pulse 70   Temp 97.7 F (36.5 C)   Resp 17   Ht 5\' 4"  (1.626 m)   Wt 107 lb 6.4 oz (48.7 kg)   LMP 05/29/2020 (Exact Date)   SpO2 100%   BMI 18.44 kg/m   - Urinalysis Dipstick - Ambulatory referral to Ophthalmology - amLODipine (NORVASC) 5 MG tablet;  Take 1 tablet (5 mg total) by mouth daily.  Dispense: 90 tablet; Refill: 5 - Basic Metabolic Panel - Lipid Panel  2. Tobacco dependence Smoking cessation instruction/counseling given:  counseled patient on the dangers of tobacco use, advised patient to stop smoking, and reviewed strategies to maximize success  3. Need for influenza vaccination - Flu Vaccine QUAD 36+ mos IM (Fluarix, Fluzone & Afluria Quad PF  4. Environmental allergies - loratadine (CLARITIN) 10 MG tablet; Take 1 tablet (10 mg total) by mouth daily.  Dispense: 30 tablet; Refill: 11  Follow-up: Return in about 6 months (around 12/14/2020).    12/16/2020  APRN, MSN, FNP-C [facility] [address] [place], [place] [phone]

## 2020-06-17 LAB — BASIC METABOLIC PANEL
BUN/Creatinine Ratio: 10 (ref 9–23)
BUN: 9 mg/dL (ref 6–24)
CO2: 25 mmol/L (ref 20–29)
Calcium: 9.1 mg/dL (ref 8.7–10.2)
Chloride: 104 mmol/L (ref 96–106)
Creatinine, Ser: 0.9 mg/dL (ref 0.57–1.00)
GFR calc Af Amer: 90 mL/min/{1.73_m2} (ref >59)
GFR calc non Af Amer: 78 mL/min/{1.73_m2} (ref >59)
Glucose: 87 mg/dL (ref 65–99)
Potassium: 3.9 mmol/L (ref 3.5–5.2)
Sodium: 139 mmol/L (ref 134–144)

## 2020-06-17 LAB — LIPID PANEL
Chol/HDL Ratio: 2.6 ratio (ref 0.0–4.4)
Cholesterol, Total: 119 mg/dL (ref 100–199)
HDL: 45 mg/dL (ref >39)
LDL Chol Calc (NIH): 60 mg/dL (ref 0–99)
Triglycerides: 64 mg/dL (ref 0–149)
VLDL Cholesterol Cal: 14 mg/dL (ref 5–40)

## 2020-06-25 ENCOUNTER — Telehealth: Payer: Self-pay

## 2020-06-25 NOTE — Telephone Encounter (Signed)
-----   Message from Massie Maroon, [place] sent at 06/23/2020  4:50 PM EST ----- Regarding: lab results Please inform patient that all laboratory values were within normal range.  No medication changes warranted at this time.  Follow-up in clinic as scheduled.  Nolon Nations  APRN, MSN, FNP-C [facility] [address] [place], [place] [address] [phone]

## 2020-06-25 NOTE — Telephone Encounter (Signed)
Pt aware of results and voiced understanding, all questions answered/concerns addressed.  

## 2020-06-26 MED FILL — LORATADINE 10 MG TABLET: 10 | 30 days supply | Qty: 30 | Fill #8

## 2020-07-09 MED FILL — LORATADINE 10 MG TABLET: 10 | 30 days supply | Qty: 30 | Fill #8

## 2020-08-14 MED FILL — LORATADINE 10 MG TABLET: 10 | 30 days supply | Qty: 30 | Fill #9

## 2020-08-25 MED FILL — AMLODIPINE BESYLATE 5 MG TA: 5 | 90 days supply | Qty: 90 | Fill #5

## 2020-10-24 ENCOUNTER — Other Ambulatory Visit: Payer: Self-pay

## 2020-11-20 ENCOUNTER — Other Ambulatory Visit: Payer: Self-pay

## 2020-12-15 ENCOUNTER — Ambulatory Visit: Payer: Medicaid Other | Admitting: Family Medicine

## 2020-12-29 ENCOUNTER — Ambulatory Visit: Payer: Medicaid Other | Admitting: Family Medicine

## 2021-01-07 ENCOUNTER — Other Ambulatory Visit: Payer: Self-pay

## 2021-01-08 ENCOUNTER — Other Ambulatory Visit: Payer: Self-pay

## 2021-01-14 ENCOUNTER — Other Ambulatory Visit: Payer: Self-pay

## 2021-02-11 ENCOUNTER — Other Ambulatory Visit: Payer: Self-pay

## 2021-02-12 ENCOUNTER — Other Ambulatory Visit: Payer: Self-pay

## 2021-02-23 ENCOUNTER — Ambulatory Visit: Payer: Medicaid Other | Admitting: Family Medicine

## 2021-03-12 ENCOUNTER — Other Ambulatory Visit: Payer: Self-pay

## 2021-03-19 ENCOUNTER — Other Ambulatory Visit: Payer: Self-pay

## 2021-03-24 ENCOUNTER — Other Ambulatory Visit: Payer: Self-pay

## 2021-03-26 ENCOUNTER — Other Ambulatory Visit: Payer: Self-pay

## 2021-04-27 ENCOUNTER — Ambulatory Visit (INDEPENDENT_AMBULATORY_CARE_PROVIDER_SITE_OTHER): Payer: Medicaid Other | Admitting: Family Medicine

## 2021-04-27 ENCOUNTER — Other Ambulatory Visit: Payer: Self-pay

## 2021-04-27 ENCOUNTER — Encounter: Payer: Self-pay | Admitting: Family Medicine

## 2021-04-27 VITALS — BP 116/70 | HR 76 | Temp 97.8°F | Ht 60.0 in | Wt 105.4 lb

## 2021-04-27 DIAGNOSIS — R5382 Chronic fatigue, unspecified: Secondary | ICD-10-CM

## 2021-04-27 DIAGNOSIS — M62838 Other muscle spasm: Secondary | ICD-10-CM

## 2021-04-27 DIAGNOSIS — I10 Essential (primary) hypertension: Secondary | ICD-10-CM | POA: Diagnosis not present

## 2021-04-27 DIAGNOSIS — Z23 Encounter for immunization: Secondary | ICD-10-CM

## 2021-04-27 MED ORDER — METHOCARBAMOL 500 MG PO TABS
500.0000 mg | ORAL_TABLET | Freq: Two times a day (BID) | ORAL | 1 refills | Status: DC
  Filled 2021-04-27 – 2021-11-05 (×2): qty 30, 15d supply, fill #0

## 2021-04-27 NOTE — Progress Notes (Signed)
[facility]   Established Patient Office Visit  Subjective:  [name], female    DOB: May 03, 1976  Age: 45 y.o. MRN: [id number]  CC:  Chief Complaint  Patient presents with   Hypertension    Pt is here for her follow up visit for hypertension. Pt states that she is 45 yrs old and is having pains in her legs and irregular periods. Pt would like to discuss what could be the the issues.    HPI [name] is a very pleasant 45 year old female with a medical history significant for hypertension and tobacco dependence presents for follow-up chronic issues.  Patient has a primary complaint of irregular menstrual cycles over the past several months.  She states that some months her menstrual cycle comes on at least 2 times.  Also she is gone about 3 to 4 months without a menstrual period.  Patient is not on any birth control.  She has not had irregular menses in the past.  She is unaware of the age which her mother experienced menopause.  Patient denies history of abnormal Pap smear, associated symptoms include fatigue, irritability, and moodiness. Patient is also complaining of body aches and pain.  She has been working increased work hours over the past several months.  She reports long periods of standing, with minimal breaks during her shift.  Patient has not attempted any over-the-counter medications for this problem.  She endorses some back spasms that are worsening with prolonged standing.  Patient denies any recent falls or trauma.  She denies any urinary frequency, urgency, hematuria, or incontinence of urine or stool.   Past Medical History:  Diagnosis Date   Hypertension    Left shoulder pain     Past Surgical History:  Procedure Laterality Date   CESAREAN SECTION     TUBAL LIGATION      Family History  Problem Relation Age of Onset   Cancer Father     Social History   Socioeconomic History   Marital  status: Single    Spouse name: Not on file   Number of children: Not on file   Years of education: Not on file   Highest education level: Not on file  Occupational History   Not on file  Tobacco Use   Smoking status: Every Day    Packs/day: 1.00    Years: 15.00    Pack years: 15.00    Types: Cigarettes   Smokeless tobacco: Never   Tobacco comments:    down to a half pack  Vaping Use   Vaping Use: Never used  Substance and Sexual Activity   Alcohol use: No   Drug use: No   Sexual activity: Not Currently  Other Topics Concern   Not on file  Social History Narrative   Not on file   Social Determinants of Health   Financial Resource Strain: Not on file  Food Insecurity: Not on file  Transportation Needs: Not on file  Physical Activity: Not on file  Stress: Not on file  Social Connections: Not on file  Intimate Partner Violence: Not on file    Outpatient Medications Prior to Visit  Medication Sig Dispense Refill   acetaminophen (TYLENOL) 500 MG tablet Take 1 tablet (500 mg total) by mouth every 6 (six) hours as needed. 30 tablet 0   amLODipine (NORVASC) 5 MG tablet Take 1 tablet (5 mg total) by mouth daily. 90 tablet 5  lidocaine (LIDODERM) 5 % Place 1 patch onto the skin daily. Remove & Discard patch within 12 hours or as directed by MD     loratadine (CLARITIN) 10 MG tablet Take 1 tablet (10 mg total) by mouth daily. 30 tablet 11   methocarbamol (ROBAXIN) 500 MG tablet Take 1 tablet (500 mg total) by mouth 2 (two) times daily. (Patient not taking: Reported on 04/27/2021) 30 tablet 1   No facility-administered medications prior to visit.    No Known Allergies  ROS Review of Systems  Constitutional:  Positive for fatigue.  HENT: Negative.    Eyes: Negative.   Respiratory: Negative.    Cardiovascular: Negative.   Gastrointestinal: Negative.   Endocrine: Negative.  Negative for cold intolerance and heat intolerance.  Genitourinary:  Positive for menstrual problem.   Allergic/Immunologic: Negative.   Neurological: Negative.   Hematological: Negative.   Psychiatric/Behavioral: Negative.       Objective:    Physical Exam Constitutional:      Appearance: Normal appearance.  HENT:     Mouth/Throat:     Mouth: Mucous membranes are moist.     Pharynx: Oropharynx is clear.  Eyes:     Pupils: Pupils are equal, round, and reactive to light.  Cardiovascular:     Rate and Rhythm: Normal rate and regular rhythm.  Pulmonary:     Effort: Pulmonary effort is normal.  Abdominal:     General: Bowel sounds are normal.  Musculoskeletal:        General: Normal range of motion.  Skin:    General: Skin is warm.  Neurological:     General: No focal deficit present.     Mental Status: She is alert.  Psychiatric:        Mood and Affect: Mood normal.        Thought Content: Thought content normal.        Judgment: Judgment normal.    BP 116/70   Pulse 76   Temp 97.8 F (36.6 C)   Ht 5' (1.524 m)   Wt 105 lb 6.4 oz (47.8 kg)   SpO2 (!) 76%   BMI 20.58 kg/m  Wt Readings from Last 3 Encounters:  04/27/21 105 lb 6.4 oz (47.8 kg)  06/16/20 107 lb 6.4 oz (48.7 kg)  10/08/19 105 lb (47.6 kg)     Health Maintenance Due  Topic Date Due   COVID-19 Vaccine (1) Never done   Hepatitis C Screening  Never done   COLONOSCOPY (Pts 45-55yrs Insurance coverage will need to be confirmed)  Never done    There are no preventive care reminders to display for this patient.  Lab Results  Component Value Date   TSH 0.431 (L) 01/09/2019   Lab Results  Component Value Date   WBC 4.7 10/05/2017   HGB 12.4 10/05/2017   HCT 36.4 10/05/2017   MCV 97.8 10/05/2017   PLT 217 10/05/2017   Lab Results  Component Value Date   NA 139 06/16/2020   K 3.9 06/16/2020   CO2 25 06/16/2020   GLUCOSE 87 06/16/2020   BUN 9 06/16/2020   CREATININE 0.90 06/16/2020   BILITOT 0.7 03/15/2018   ALKPHOS 51 03/15/2018   AST 15 03/15/2018   ALT 22 03/15/2018   PROT 6.8  03/15/2018   ALBUMIN 4.5 03/15/2018   CALCIUM 9.1 06/16/2020   ANIONGAP 7 10/05/2017   Lab Results  Component Value Date   CHOL 119 06/16/2020   Lab Results  Component Value Date  HDL 45 06/16/2020   Lab Results  Component Value Date   LDLCALC 60 06/16/2020   Lab Results  Component Value Date   TRIG 64 06/16/2020   Lab Results  Component Value Date   CHOLHDL 2.6 06/16/2020   Lab Results  Component Value Date   HGBA1C 4.6 08/04/2016      Assessment & Plan:   Problem List Items Addressed This Visit       Cardiovascular and Mediastinum   Essential hypertension   Relevant Orders   Comprehensive metabolic panel (Completed)     Musculoskeletal and Integument   Muscle spasm of left shoulder   Relevant Medications   methocarbamol (ROBAXIN) 500 MG tablet   Other Visit Diagnoses     Chronic fatigue    -  Primary   Relevant Orders   Thyroid Panel With TSH (Completed)   Anemia panel (Completed)   Needs flu shot       Relevant Orders   Flu Vaccine QUAD 64mo+IM (Fluarix, Fluzone & Alfiuria Quad PF) (Completed)      1. Essential hypertension BP 116/70   Pulse 76   Temp 97.8 F (36.6 C)   Ht 5' (1.524 m)   Wt 105 lb 6.4 oz (47.8 kg)   SpO2 (!) 76%   BMI 20.58 kg/m  - Continue medication, monitor blood pressure at home. Continue DASH diet.  Reminder to go to the ER if any CP, SOB, nausea, dizziness, severe HA, changes vision/speech, left arm numbness and tingling and jaw pain.   - Comprehensive metabolic panel  2. Chronic fatigue  - Thyroid Panel With TSH - Anemia panel  3. Muscle spasm of left shoulder  - methocarbamol (ROBAXIN) 500 MG tablet; Take 1 tablet (500 mg total) by mouth 2 (two) times daily.  Dispense: 30 tablet; Refill: 1  4. Needs flu shot  - Flu Vaccine QUAD 59mo+IM (Fluarix, Fluzone & Alfiuria Quad PF)    Follow-up: 6 months for chronic conditions   Corley Kohls Rennis Petty  APRN, MSN, FNP-C [facility] [address] [place], [place] [address] [phone]

## 2021-04-29 LAB — COMPREHENSIVE METABOLIC PANEL
ALT: 25 IU/L (ref 0–32)
AST: 18 IU/L (ref 0–40)
Albumin/Globulin Ratio: 2 (ref 1.2–2.2)
Albumin: 4.7 g/dL (ref 3.8–4.8)
Alkaline Phosphatase: 57 IU/L (ref 44–121)
BUN/Creatinine Ratio: 14 (ref 9–23)
BUN: 10 mg/dL (ref 6–24)
Bilirubin Total: 0.3 mg/dL (ref 0.0–1.2)
CO2: 23 mmol/L (ref 20–29)
Calcium: 9.1 mg/dL (ref 8.7–10.2)
Chloride: 101 mmol/L (ref 96–106)
Creatinine, Ser: 0.73 mg/dL (ref 0.57–1.00)
Globulin, Total: 2.4 g/dL (ref 1.5–4.5)
Glucose: 92 mg/dL (ref 70–99)
Potassium: 4.3 mmol/L (ref 3.5–5.2)
Sodium: 138 mmol/L (ref 134–144)
Total Protein: 7.1 g/dL (ref 6.0–8.5)
eGFR: 103 mL/min/{1.73_m2} (ref >59)

## 2021-04-29 LAB — ANEMIA PANEL
Ferritin: 50 ng/mL (ref 15–150)
Hematocrit: 49.1 % — ABNORMAL HIGH (ref 34.0–46.6)
Iron Saturation: 23 % (ref 15–55)
Iron: 81 ug/dL (ref 27–159)
Retic Ct Pct: 1.3 % (ref 0.6–2.6)
Total Iron Binding Capacity: 349 ug/dL (ref 250–450)
UIBC: 268 ug/dL (ref 131–425)
Vitamin B-12: 1550 pg/mL — ABNORMAL HIGH (ref 232–1245)

## 2021-04-29 LAB — THYROID PANEL WITH TSH
Free Thyroxine Index: 1.7 (ref 1.2–4.9)
T3 Uptake Ratio: 25 % (ref 24–39)
T4, Total: 6.7 ug/dL (ref 4.5–12.0)
TSH: 1.97 u[IU]/mL (ref 0.450–4.500)

## 2021-05-05 ENCOUNTER — Telehealth: Payer: Self-pay | Admitting: Family Medicine

## 2021-05-05 NOTE — Telephone Encounter (Signed)
Reviewed all laboratory values.  B12 levels elevated.  All laboratory values otherwise unremarkable.  Recommend that patient takes an over-the-counter multivitamin such as One-A-Day for women.  Also, recommend a balanced diet divided over small meals throughout the day.  Continue amlodipine 5 mg daily as prescribed.  Follow-up in clinic in 3 months.   Nolon Nations  APRN, MSN, FNP-C Patient Care [facility] [address] [place], [place] [address] [phone]

## 2021-05-21 ENCOUNTER — Other Ambulatory Visit: Payer: Self-pay

## 2021-05-24 ENCOUNTER — Other Ambulatory Visit: Payer: Self-pay

## 2021-05-25 ENCOUNTER — Other Ambulatory Visit: Payer: Self-pay

## 2021-05-31 ENCOUNTER — Other Ambulatory Visit: Payer: Self-pay

## 2021-07-07 ENCOUNTER — Other Ambulatory Visit: Payer: Self-pay

## 2021-07-07 ENCOUNTER — Other Ambulatory Visit: Payer: Self-pay | Admitting: Family Medicine

## 2021-07-07 DIAGNOSIS — Z9109 Other allergy status, other than to drugs and biological substances: Secondary | ICD-10-CM

## 2021-07-07 MED ORDER — LORATADINE 10 MG PO TABS
10.0000 mg | ORAL_TABLET | Freq: Every day | ORAL | 11 refills | Status: DC
  Filled 2021-07-07: qty 30, 30d supply, fill #0
  Filled 2021-08-13: qty 30, 30d supply, fill #1
  Filled 2021-08-13: qty 30, 30d supply, fill #0
  Filled 2021-11-05: qty 30, 30d supply, fill #1
  Filled 2022-01-12: qty 30, 30d supply, fill #2
  Filled 2022-02-09: qty 30, 30d supply, fill #3
  Filled 2022-03-10: qty 30, 30d supply, fill #4
  Filled 2022-04-08: qty 30, 30d supply, fill #5

## 2021-07-09 ENCOUNTER — Other Ambulatory Visit: Payer: Self-pay

## 2021-08-13 ENCOUNTER — Other Ambulatory Visit: Payer: Self-pay

## 2021-08-13 ENCOUNTER — Other Ambulatory Visit: Payer: Self-pay | Admitting: Family Medicine

## 2021-08-13 DIAGNOSIS — I10 Essential (primary) hypertension: Secondary | ICD-10-CM

## 2021-08-16 ENCOUNTER — Other Ambulatory Visit: Payer: Self-pay

## 2021-08-19 ENCOUNTER — Other Ambulatory Visit: Payer: Self-pay

## 2021-08-19 MED ORDER — AMLODIPINE BESYLATE 5 MG PO TABS
5.0000 mg | ORAL_TABLET | Freq: Every day | ORAL | 2 refills | Status: DC
  Filled 2021-08-19 (×2): qty 90, 90d supply, fill #0
  Filled 2021-11-05: qty 90, 90d supply, fill #1
  Filled 2022-02-09: qty 90, 90d supply, fill #2

## 2021-10-26 ENCOUNTER — Ambulatory Visit (INDEPENDENT_AMBULATORY_CARE_PROVIDER_SITE_OTHER): Payer: Medicaid Other | Admitting: Family Medicine

## 2021-10-26 ENCOUNTER — Encounter: Payer: Self-pay | Admitting: Family Medicine

## 2021-10-26 VITALS — BP 125/84 | HR 76 | Temp 98.1°F | Ht 60.0 in | Wt 108.0 lb

## 2021-10-26 DIAGNOSIS — I10 Essential (primary) hypertension: Secondary | ICD-10-CM

## 2021-10-26 DIAGNOSIS — F17200 Nicotine dependence, unspecified, uncomplicated: Secondary | ICD-10-CM

## 2021-10-26 NOTE — Progress Notes (Signed)
? ?[facility] ? ?Established Patient Office Visit ? ?Subjective:  ?[name], female    DOB: 05-20-1976  Age: 46 y.o. MRN: [id number] ? ?CC:  ?Chief Complaint  ?Patient presents with  ? Follow-up  ?  Pt is here 6 month follow up.  ? ? ?HPI ? ?[name] is a very pleasant 46 year old female with a medical history significant for essential hypertension and tobacco dependence.  Patient states that she has been doing very well and has minimal complaints on today.  She has been taking medications consistently and without interruption.  She does not check her blood pressure at home.  Patient has a long history of tobacco dependence and is not ready to quit at this time.  Patient has attempted smoking cessation in the past without success.  She currently denies any headache, chest pain, urinary symptoms, nausea, vomiting, or lower extremity swelling. ? ?Hypertension ?This is a chronic problem. Pertinent negatives include no blurred vision, chest pain, headaches, orthopnea, shortness of breath or sweats. Risk factors for coronary artery disease include smoking/tobacco exposure. Compliance problems include diet.  There is no history of chronic renal disease, hypercortisolism, renovascular disease, sleep apnea or a thyroid problem.  ? ? ?Past Medical History:  ?Diagnosis Date  ? Hypertension   ? Left shoulder pain   ? ? ?Past Surgical History:  ?Procedure Laterality Date  ? CESAREAN SECTION    ? TUBAL LIGATION    ? ? ?Family History  ?Problem Relation Age of Onset  ? Cancer Father   ? ? ?Social History  ? ?Socioeconomic History  ? Marital status: Single  ?  Spouse name: Not on file  ? Number of children: Not on file  ? Years of education: Not on file  ? Highest education level: Not on file  ?Occupational History  ? Not on file  ?Tobacco Use  ? Smoking status: Every Day  ?  Packs/day: 1.00  ?  Years: 15.00  ?  Pack years: 15.00  ?  Types: Cigarettes  ?  Smokeless tobacco: Never  ? Tobacco comments:  ?  down to a half pack  ?Vaping Use  ? Vaping Use: Never used  ?Substance and Sexual Activity  ? Alcohol use: No  ? Drug use: No  ? Sexual activity: Not Currently  ?Other Topics Concern  ? Not on file  ?Social History Narrative  ? Not on file  ? ?Social Determinants of Health  ? ?Financial Resource Strain: Not on file  ?Food Insecurity: Not on file  ?Transportation Needs: Not on file  ?Physical Activity: Not on file  ?Stress: Not on file  ?Social Connections: Not on file  ?Intimate Partner Violence: Not on file  ? ? ?Outpatient Medications Prior to Visit  ?Medication Sig Dispense Refill  ? amLODipine (NORVASC) 5 MG tablet Take 1 tablet (5 mg total) by mouth daily. 90 tablet 2  ? lidocaine (LIDODERM) 5 % Place 1 patch onto the skin daily. Remove & Discard patch within 12 hours or as directed by MD    ? loratadine (CLARITIN) 10 MG tablet Take 1 tablet (10 mg total) by mouth daily. 30 tablet 11  ? methocarbamol (ROBAXIN) 500 MG tablet Take 1 tablet (500 mg total) by mouth 2 (two) times daily. 30 tablet 1  ? acetaminophen (TYLENOL) 500 MG tablet Take 1 tablet (500 mg total) by mouth every 6 (six) hours as needed. (Patient not taking: Reported on 10/26/2021) 30  tablet 0  ? ?No facility-administered medications prior to visit.  ? ? ?No Known Allergies ? ?ROS ?Review of Systems  ?Constitutional: Negative.  Negative for activity change and appetite change.  ?HENT: Negative.    ?Eyes:  Negative for blurred vision.  ?Respiratory: Negative.  Negative for shortness of breath.   ?Cardiovascular:  Negative for chest pain and orthopnea.  ?Gastrointestinal: Negative.   ?Endocrine: Negative.   ?Genitourinary: Negative.   ?Musculoskeletal: Negative.   ?Skin: Negative.   ?Neurological: Negative.  Negative for headaches.  ?Psychiatric/Behavioral: Negative.    ? ?  ?Objective:  ?  ?Physical Exam ?Constitutional:   ?   Appearance: Normal appearance.  ?Eyes:  ?   Pupils: Pupils are equal,  round, and reactive to light.  ?Cardiovascular:  ?   Rate and Rhythm: Normal rate.  ?Pulmonary:  ?   Effort: Pulmonary effort is normal.  ?Abdominal:  ?   General: Bowel sounds are normal.  ?Musculoskeletal:     ?   General: Normal range of motion.  ?Skin: ?   General: Skin is warm.  ?Neurological:  ?   General: No focal deficit present.  ?   Mental Status: She is alert. Mental status is at baseline.  ?Psychiatric:     ?   Mood and Affect: Mood normal.     ?   Behavior: Behavior normal.     ?   Thought Content: Thought content normal.     ?   Judgment: Judgment normal.  ? ? ?BP 125/84 (BP Location: Right Arm, Patient Position: Sitting, Cuff Size: Normal)   Pulse 76   Temp 98.1 ?F (36.7 ?C)   Ht 5' (1.524 m)   Wt 108 lb (49 kg)   SpO2 100%   BMI 21.09 kg/m?  ?Wt Readings from Last 3 Encounters:  ?10/26/21 108 lb (49 kg)  ?04/27/21 105 lb 6.4 oz (47.8 kg)  ?06/16/20 107 lb 6.4 oz (48.7 kg)  ? ? ? ?Health Maintenance Due  ?Topic Date Due  ? COVID-19 Vaccine (1) Never done  ? Hepatitis C Screening  Never done  ? COLONOSCOPY (Pts 45-43yrs Insurance coverage will need to be confirmed)  Never done  ? ? ?There are no preventive care reminders to display for this patient. ? ?Lab Results  ?Component Value Date  ? TSH 1.970 04/27/2021  ? ?Lab Results  ?Component Value Date  ? WBC 4.7 10/05/2017  ? HGB 12.4 10/05/2017  ? HCT 49.1 (H) 04/27/2021  ? MCV 97.8 10/05/2017  ? PLT 217 10/05/2017  ? ?Lab Results  ?Component Value Date  ? NA 138 04/27/2021  ? K 4.3 04/27/2021  ? CO2 23 04/27/2021  ? GLUCOSE 92 04/27/2021  ? BUN 10 04/27/2021  ? CREATININE 0.73 04/27/2021  ? BILITOT 0.3 04/27/2021  ? ALKPHOS 57 04/27/2021  ? AST 18 04/27/2021  ? ALT 25 04/27/2021  ? PROT 7.1 04/27/2021  ? ALBUMIN 4.7 04/27/2021  ? CALCIUM 9.1 04/27/2021  ? ANIONGAP 7 10/05/2017  ? EGFR 103 04/27/2021  ? ?Lab Results  ?Component Value Date  ? CHOL 119 06/16/2020  ? ?Lab Results  ?Component Value Date  ? HDL 45 06/16/2020  ? ?Lab Results  ?Component  Value Date  ? [place] 60 06/16/2020  ? ?Lab Results  ?Component Value Date  ? TRIG 64 06/16/2020  ? ?Lab Results  ?Component Value Date  ? CHOLHDL 2.6 06/16/2020  ? ?Lab Results  ?Component Value Date  ? HGBA1C 4.6 08/04/2016  ? ? ?  ?  Assessment & Plan:  ? ?Problem List Items Addressed This Visit   ? ?  ? Cardiovascular and Mediastinum  ? Essential hypertension - Primary  ? Relevant Orders  ? Basic Metabolic Panel  ?  ? Other  ? Tobacco dependence  ? ?Essential hypertension ?BP 125/84 (BP Location: Right Arm, Patient Position: Sitting, Cuff Size: Normal)   Pulse 76   Temp 98.1 ?F (36.7 ?C)   Ht 5' (1.524 m)   Wt 108 lb (49 kg)   SpO2 100%   BMI 21.09 kg/m?  ?- Continue medication, monitor blood pressure at home. Continue DASH diet.  Reminder to go to the ER if any CP, SOB, nausea, dizziness, severe HA, changes vision/speech, left arm numbness and tingling and jaw pain. ? ? ?- Basic Metabolic Panel ? ?Tobacco dependence ?Smoking cessation instruction/counseling given:  counseled patient on the dangers of tobacco use, advised patient to stop smoking, and reviewed strategies to maximize success ? ? ? ?Follow-up: Return in about 6 months (around 04/27/2022) for hypertension.  ? ? ?Donia Pounds  APRN, MSN, FNP-C ?[facility] ?[facility] ?[address]  ?[place], [place] [address] ?[phone] ? ?

## 2021-10-27 LAB — BASIC METABOLIC PANEL
BUN/Creatinine Ratio: 11 (ref 9–23)
BUN: 11 mg/dL (ref 6–24)
CO2: 23 mmol/L (ref 20–29)
Calcium: 9.5 mg/dL (ref 8.7–10.2)
Chloride: 103 mmol/L (ref 96–106)
Creatinine, Ser: 1.03 mg/dL — ABNORMAL HIGH (ref 0.57–1.00)
Glucose: 90 mg/dL (ref 70–99)
Potassium: 4.4 mmol/L (ref 3.5–5.2)
Sodium: 140 mmol/L (ref 134–144)
eGFR: 68 mL/min/{1.73_m2} (ref >59)

## 2021-11-05 ENCOUNTER — Other Ambulatory Visit: Payer: Self-pay

## 2021-11-08 ENCOUNTER — Other Ambulatory Visit: Payer: Self-pay

## 2022-01-12 ENCOUNTER — Other Ambulatory Visit: Payer: Self-pay

## 2022-01-13 ENCOUNTER — Other Ambulatory Visit: Payer: Self-pay

## 2022-02-09 ENCOUNTER — Other Ambulatory Visit: Payer: Self-pay

## 2022-03-10 ENCOUNTER — Other Ambulatory Visit: Payer: Self-pay

## 2022-04-08 ENCOUNTER — Other Ambulatory Visit: Payer: Self-pay

## 2022-05-03 ENCOUNTER — Ambulatory Visit (INDEPENDENT_AMBULATORY_CARE_PROVIDER_SITE_OTHER): Payer: Medicaid Other | Admitting: Family Medicine

## 2022-05-03 ENCOUNTER — Encounter: Payer: Self-pay | Admitting: Family Medicine

## 2022-05-03 ENCOUNTER — Other Ambulatory Visit: Payer: Self-pay

## 2022-05-03 VITALS — BP 115/69 | HR 76 | Temp 98.0°F | Ht 60.0 in | Wt 108.4 lb

## 2022-05-03 DIAGNOSIS — N946 Dysmenorrhea, unspecified: Secondary | ICD-10-CM

## 2022-05-03 DIAGNOSIS — Z9109 Other allergy status, other than to drugs and biological substances: Secondary | ICD-10-CM | POA: Diagnosis not present

## 2022-05-03 DIAGNOSIS — Z1211 Encounter for screening for malignant neoplasm of colon: Secondary | ICD-10-CM

## 2022-05-03 DIAGNOSIS — I10 Essential (primary) hypertension: Secondary | ICD-10-CM

## 2022-05-03 DIAGNOSIS — F17200 Nicotine dependence, unspecified, uncomplicated: Secondary | ICD-10-CM | POA: Diagnosis not present

## 2022-05-03 MED ORDER — AMLODIPINE BESYLATE 5 MG PO TABS
5.0000 mg | ORAL_TABLET | Freq: Every day | ORAL | 2 refills | Status: DC
  Filled 2022-05-03 – 2022-05-09 (×4): qty 90, 90d supply, fill #0
  Filled 2022-08-10: qty 90, 90d supply, fill #1
  Filled 2022-11-07: qty 90, 90d supply, fill #2

## 2022-05-03 MED ORDER — ACETAMINOPHEN 500 MG PO TABS: 1000.0000 mg | ORAL_TABLET | Freq: Four times a day (QID) | ORAL | 2 refills | Status: AC | PRN

## 2022-05-03 MED ORDER — LORATADINE 10 MG PO TABS
10.0000 mg | ORAL_TABLET | Freq: Every day | ORAL | 11 refills | Status: DC
  Filled 2022-05-03: qty 30, 30d supply, fill #0
  Filled 2022-06-07 – 2022-06-14 (×2): qty 30, 30d supply, fill #1
  Filled 2022-07-11: qty 30, 30d supply, fill #2
  Filled 2022-08-10: qty 30, 30d supply, fill #3
  Filled 2022-09-09: qty 30, 30d supply, fill #4
  Filled 2022-10-10: qty 30, 30d supply, fill #5
  Filled 2022-11-07: qty 30, 30d supply, fill #6
  Filled 2022-12-08: qty 30, 30d supply, fill #7
  Filled 2023-01-09: qty 30, 30d supply, fill #8
  Filled 2023-02-05: qty 30, 30d supply, fill #9
  Filled 2023-03-10: qty 30, 30d supply, fill #10
  Filled 2023-04-10: qty 30, 30d supply, fill #11

## 2022-05-03 NOTE — Patient Instructions (Signed)
Tobacco Use Disorder Tobacco use disorder (TUD) occurs when a person craves, seeks, and uses tobacco, regardless of the consequences. This disorder can cause problems with mental and physical health. It can affect your ability to have healthy relationships. It can also keep you from meeting your responsibilities at work, home, or school. Tobacco products contain a dangerous chemical called nicotine. Nicotine triggers hormones that make the body feel stimulated and works on areas of the brain that make a person feel good. These effects can make the person depend on nicotine, which makes it hard to quit tobacco. Tobacco may be: Smoked as a cigarette or cigar. Inhaled using vaping devices, such as e-cigarettes. Smoked in a pipe or hookah. Chewed as smokeless tobacco. Inhaled into the nostrils as snuff. What are the causes? This condition is caused by using nicotine. Nicotine causes changes in your brain that make you want more and more. This is called addiction. This can make it hard to stop using tobacco once you start. What are the signs or symptoms? Symptoms of TUD may include: Being unable to stop your tobacco use even after planned quit attempts. Spending an abnormal amount of time getting or using tobacco. Craving tobacco. Tobacco use that: Interferes with your work, school, or home life. Interferes with your personal and social relationships. Makes you give up activities that you once enjoyed or found important. Using tobacco even though you know that it is: Dangerous or bad for your health or someone else's health. Causing problems in your life. Needing more and more of the substance to get the same effect (developing tolerance). Using the substance to avoid unpleasant symptoms if you do not use the substance (withdrawal). How is this diagnosed? This condition may be diagnosed based on: Your current and past tobacco use. Your health care provider may ask questions about how your  tobacco use affects your life. A physical exam. You may be diagnosed with TUD if you have at least two symptoms within a 12-month period. How is this treated? This condition is treated by stopping tobacco use. Many people are unable to quit on their own and need help. Treatment may include: Nicotine replacement therapy (NRT). NRT provides nicotine without the other harmful chemicals in tobacco. NRT gradually lowers the dosage of nicotine in the body and reduces withdrawal symptoms. NRT is available as: Over-the-counter gums, lozenges, and skin patches. Prescription mouth inhalers and nasal sprays. Medicine that acts on the brain to reduce cravings and withdrawal symptoms. A type of talk therapy that examines your triggers for tobacco use, how to avoid them, and how to cope with cravings (behavioral therapy). Hypnosis. This may help with withdrawal symptoms. Joining a support group for people coping with TUD. The best treatment for TUD is usually a combination of medicine, talk therapy, and support groups. Recovery can be a long process. Many people start using tobacco again after stopping (relapse). If you relapse, it does not mean that treatment will not work. Follow these instructions at home:  Lifestyle Do not use any products that contain nicotine or tobacco. These products include cigarettes, chewing tobacco, and vaping devices, such as e-cigarettes. If you need help quitting, ask your health care provider. Avoid things that trigger tobacco use as much as you can. Triggers include people and situations that usually cause you to use tobacco. Avoid drinks that contain caffeine, including coffee. These may worsen some withdrawal symptoms. Find ways to manage stress. Wanting to smoke may cause stress, and stress can make you want   to smoke. Relaxation techniques such as deep breathing, meditation, and yoga may help. Attend support groups as needed. These groups are an important part of long-term  recovery for many people. General instructions Take over-the-counter and prescription medicines only as told by your health care provider. Check with your health care provider before taking any new prescription or over-the-counter medicines. Decide on a friend, family member, or smoking quit-line (such as 1-800-QUIT-NOW in the [place] that you can call or text when you feel the urge to smoke or when you need help coping with cravings. Keep all follow-up visits. This is important. Contact a health care provider if: You are not able to take your medicines as told by your health care provider. Your symptoms get worse, even with treatment. Summary Tobacco use disorder (TUD) occurs when a person craves, seeks, and uses tobacco regardless of the consequences. This condition may be diagnosed based on your current and past tobacco use and a physical exam. Many people are unable to quit on their own and need help. Recovery can be a long process. The most effective treatment for TUD is usually a combination of medicine, talk therapy, and support groups. This information is not intended to replace advice given to you by your health care provider. Make sure you discuss any questions you have with your health care provider. Document Revised: 07/06/2021 Document Reviewed: 07/06/2021 Elsevier Patient Education  2023 Elsevier Inc.  

## 2022-05-03 NOTE — Progress Notes (Unsigned)
Established Patient Office Visit  Subjective   [name], female    DOB: May 11, 1976  Age: 46 y.o. MRN: [id number]  Chief Complaint  Patient presents with   Hypertension    No concerns.    [name] is a 46 year old female with a medical history significant for hypertension and tobacco dependence presents for a follow up of chronic conditions.   Hypertension This is a chronic problem. The problem is controlled. Pertinent negatives include no blurred vision, chest pain, headaches, orthopnea, palpitations, peripheral edema, PND, shortness of breath or sweats. Risk factors for coronary artery disease include smoking/tobacco exposure. There are no compliance problems.     Patient Active Problem List   Diagnosis Date Noted   Abscess of left axilla 03/12/2019   Radiculopathy of cervicothoracic region 11/22/2016   Essential hypertension 11/21/2016   Injury of left shoulder 08/04/2016   Muscle spasm of left shoulder 12/31/2015   Left shoulder pain 12/31/2015   Tobacco dependence 12/31/2015   Past Medical History:  Diagnosis Date   Hypertension    Left shoulder pain    Past Surgical History:  Procedure Laterality Date   CESAREAN SECTION     TUBAL LIGATION     Social History   Tobacco Use   Smoking status: Every Day    Packs/day: 1.00    Years: 15.00    Total pack years: 15.00    Types: Cigarettes   Smokeless tobacco: Never   Tobacco comments:    down to a half pack  Vaping Use   Vaping Use: Never used  Substance Use Topics   Alcohol use: No   Drug use: No   Social History   Socioeconomic History   Marital status: Single    Spouse name: Not on file   Number of children: Not on file   Years of education: Not on file   Highest education level: Not on file  Occupational History   Not on file  Tobacco Use   Smoking status: Every Day    Packs/day: 1.00    Years: 15.00    Total pack years: 15.00    Types: Cigarettes   Smokeless tobacco:  Never   Tobacco comments:    down to a half pack  Vaping Use   Vaping Use: Never used  Substance and Sexual Activity   Alcohol use: No   Drug use: No   Sexual activity: Not Currently  Other Topics Concern   Not on file  Social History Narrative   Not on file   Social Determinants of Health   Financial Resource Strain: Not on file  Food Insecurity: Not on file  Transportation Needs: Not on file  Physical Activity: Not on file  Stress: Not on file  Social Connections: Not on file  Intimate Partner Violence: Not on file   Family Status  Relation Name Status   Mother  Alive   Father  Deceased   Family History  Problem Relation Age of Onset   Cancer Father    No Known Allergies    Review of Systems  Constitutional: Negative.   HENT: Negative.    Eyes: Negative.  Negative for blurred vision.  Respiratory: Negative.  Negative for shortness of breath.   Cardiovascular: Negative.  Negative for chest pain, palpitations, orthopnea and PND.  Gastrointestinal: Negative.   Genitourinary: Negative.   Musculoskeletal: Negative.   Skin: Negative.   Neurological: Negative.  Negative for headaches.  Endo/Heme/Allergies: Negative.   Psychiatric/Behavioral:  Negative.        Objective:     BP 115/69   Pulse 76   Temp 98 F (36.7 C) (Temporal)   Ht 5' (1.524 m)   Wt 108 lb 6.4 oz (49.2 kg)   LMP 04/21/2022 (Exact Date)   SpO2 98%   BMI 21.17 kg/m  BP Readings from Last 3 Encounters:  05/03/22 115/69  10/26/21 125/84  04/27/21 116/70   Wt Readings from Last 3 Encounters:  05/03/22 108 lb 6.4 oz (49.2 kg)  10/26/21 108 lb (49 kg)  04/27/21 105 lb 6.4 oz (47.8 kg)     Physical Exam Constitutional:      Appearance: Normal appearance.  Eyes:     Pupils: Pupils are equal, round, and reactive to light.  Cardiovascular:     Rate and Rhythm: Normal rate and regular rhythm.     Pulses: Normal pulses.  Pulmonary:     Effort: Pulmonary effort is normal.  Abdominal:      General: Bowel sounds are normal.  Skin:    General: Skin is warm.  Neurological:     General: No focal deficit present.     Mental Status: She is alert. Mental status is at baseline.  Psychiatric:        Mood and Affect: Mood normal.        Thought Content: Thought content normal.        Judgment: Judgment normal.      No results found for any visits on 05/03/22.  Last CBC Lab Results  Component Value Date   WBC 4.7 10/05/2017   HGB 12.4 10/05/2017   HCT 49.1 (H) 04/27/2021   MCV 97.8 10/05/2017   MCH 33.3 10/05/2017   RDW 13.4 10/05/2017   PLT 217 40/02/6760   Last metabolic panel Lab Results  Component Value Date   GLUCOSE 90 10/26/2021   NA 140 10/26/2021   K 4.4 10/26/2021   CL 103 10/26/2021   CO2 23 10/26/2021   BUN 11 10/26/2021   CREATININE 1.03 (H) 10/26/2021   EGFR 68 10/26/2021   CALCIUM 9.5 10/26/2021   PROT 7.1 04/27/2021   ALBUMIN 4.7 04/27/2021   LABGLOB 2.4 04/27/2021   AGRATIO 2.0 04/27/2021   BILITOT 0.3 04/27/2021   ALKPHOS 57 04/27/2021   AST 18 04/27/2021   ALT 25 04/27/2021   ANIONGAP 7 10/05/2017   Last lipids Lab Results  Component Value Date   CHOL 119 06/16/2020   HDL 45 06/16/2020   LDLCALC 60 06/16/2020   TRIG 64 06/16/2020   CHOLHDL 2.6 06/16/2020   Last hemoglobin A1c Lab Results  Component Value Date   HGBA1C 4.6 08/04/2016   Last thyroid functions Lab Results  Component Value Date   TSH 1.970 04/27/2021   T4TOTAL 6.7 04/27/2021   Last vitamin D No results found for: "25OHVITD2", "25OHVITD3", "VD25OH" Last vitamin B12 and Folate Lab Results  Component Value Date   VITAMINB12 1,550 (H) 04/27/2021      The ASCVD Risk score (Arnett DK, et al., 2019) failed to calculate for the following reasons:   The valid total cholesterol range is 130 to 320 mg/dL    Assessment & Plan:   Problem List Items Addressed This Visit       Cardiovascular and Mediastinum   Essential hypertension   Relevant Orders    Basic Metabolic Panel     Other   Tobacco dependence   Other Visit Diagnoses     Colon cancer screening    -  Primary   Relevant Orders   Ambulatory referral to Gastroenterology     1. Essential hypertension BP 115/69   Pulse 76   Temp 98 F (36.7 C) (Temporal)   Ht 5' (1.524 m)   Wt 108 lb 6.4 oz (49.2 kg)   LMP 04/21/2022 (Exact Date)   SpO2 98%   BMI 21.17 kg/m  - Continue medication, monitor blood pressure at home. Continue DASH diet.  Reminder to go to the ER if any CP, SOB, nausea, dizziness, severe HA, changes vision/speech, left arm numbness and tingling and jaw pain.   - Basic Metabolic Panel - amLODipine (NORVASC) 5 MG tablet; Take 1 tablet (5 mg total) by mouth daily.  Dispense: 90 tablet; Refill: 2  2. Colon cancer screening  - Ambulatory referral to Gastroenterology  3. Tobacco dependence Smoking cessation instruction/counseling given:  counseled patient on the dangers of tobacco use, advised patient to stop smoking, and reviewed strategies to maximize success   4. Environmental allergies  - loratadine (CLARITIN) 10 MG tablet; Take 1 tablet (10 mg total) by mouth daily.  Dispense: 30 tablet; Refill: 11  5. Menstrual cramps  - acetaminophen (TYLENOL) 500 MG tablet; Take 2 tablets (1,000 mg total) by mouth every 6 (six) hours as needed.  Dispense: 60 tablet; Refill: 2   Return in about 6 months (around 11/02/2022) for hypertension.   Donia Pounds  APRN, MSN, FNP-C [facility] [address] [place], [place] [address] [phone]

## 2022-05-04 LAB — BASIC METABOLIC PANEL
BUN/Creatinine Ratio: 11 (ref 9–23)
BUN: 11 mg/dL (ref 6–24)
CO2: 24 mmol/L (ref 20–29)
Calcium: 9.1 mg/dL (ref 8.7–10.2)
Chloride: 102 mmol/L (ref 96–106)
Creatinine, Ser: 0.99 mg/dL (ref 0.57–1.00)
Glucose: 82 mg/dL (ref 70–99)
Potassium: 4 mmol/L (ref 3.5–5.2)
Sodium: 140 mmol/L (ref 134–144)
eGFR: 71 mL/min/{1.73_m2} (ref >59)

## 2022-05-09 ENCOUNTER — Other Ambulatory Visit: Payer: Self-pay

## 2022-05-13 ENCOUNTER — Other Ambulatory Visit: Payer: Self-pay

## 2022-05-26 ENCOUNTER — Emergency Department (HOSPITAL_COMMUNITY)
Admission: EM | Admit: 2022-05-26 | Discharge: 2022-05-26 | Disposition: A | Discharge status: Home / Self Care | Payer: Medicaid Other | Attending: Emergency Medicine | Admitting: Emergency Medicine

## 2022-05-26 ENCOUNTER — Emergency Department (HOSPITAL_COMMUNITY): Payer: Medicaid Other

## 2022-05-26 ENCOUNTER — Encounter (HOSPITAL_COMMUNITY): Payer: Self-pay

## 2022-05-26 DIAGNOSIS — S40012A Contusion of left shoulder, initial encounter: Secondary | ICD-10-CM | POA: Diagnosis not present

## 2022-05-26 DIAGNOSIS — Y92410 Unspecified street and highway as the place of occurrence of the external cause: Secondary | ICD-10-CM | POA: Insufficient documentation

## 2022-05-26 DIAGNOSIS — M25512 Pain in left shoulder: Secondary | ICD-10-CM | POA: Diagnosis present

## 2022-05-26 DIAGNOSIS — S63602A Unspecified sprain of left thumb, initial encounter: Secondary | ICD-10-CM | POA: Diagnosis not present

## 2022-05-26 MED ORDER — METHOCARBAMOL 500 MG PO TABS
500.0000 mg | ORAL_TABLET | Freq: Four times a day (QID) | ORAL | 0 refills | Status: DC | PRN
  Filled 2022-05-26 – 2022-06-14 (×2): qty 20, 5d supply, fill #0

## 2022-05-26 MED ORDER — OXYCODONE-ACETAMINOPHEN 5-325 MG PO TABS
1.0000 | ORAL_TABLET | ORAL | Status: DC | PRN
  Administered 2022-05-26: 1 via ORAL
  Filled 2022-05-26: qty 1
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
## 2022-05-27 ENCOUNTER — Other Ambulatory Visit: Payer: Self-pay

## 2022-06-03 ENCOUNTER — Other Ambulatory Visit: Payer: Self-pay

## 2022-06-07 ENCOUNTER — Other Ambulatory Visit: Payer: Self-pay

## 2022-06-13 ENCOUNTER — Other Ambulatory Visit: Payer: Self-pay

## 2022-06-14 ENCOUNTER — Other Ambulatory Visit: Payer: Self-pay

## 2022-07-12 ENCOUNTER — Other Ambulatory Visit: Payer: Self-pay

## 2022-09-09 ENCOUNTER — Other Ambulatory Visit: Payer: Self-pay

## 2022-10-05 ENCOUNTER — Other Ambulatory Visit: Payer: Self-pay

## 2022-10-05 MED ORDER — AMOXICILLIN 500 MG PO CAPS
500.0000 mg | ORAL_CAPSULE | Freq: Three times a day (TID) | ORAL | 0 refills | Status: DC
  Filled 2022-10-05: qty 21, 7d supply, fill #0

## 2022-10-05 MED ORDER — ACETAMINOPHEN-CODEINE 300-30 MG PO TABS
1.0000 | ORAL_TABLET | Freq: Four times a day (QID) | ORAL | 0 refills | Status: DC
  Filled 2022-10-05: qty 12, 3d supply, fill #0

## 2022-10-13 ENCOUNTER — Other Ambulatory Visit: Payer: Self-pay

## 2022-10-13 MED ORDER — AMOXICILLIN 500 MG PO CAPS
500.0000 mg | ORAL_CAPSULE | Freq: Three times a day (TID) | ORAL | 0 refills | Status: DC
  Filled 2022-10-13: qty 21, 7d supply, fill #0

## 2022-10-13 MED ORDER — IBUPROFEN 800 MG PO TABS
800.0000 mg | ORAL_TABLET | Freq: Four times a day (QID) | ORAL | 0 refills | Status: DC | PRN
  Filled 2022-10-13: qty 21, 6d supply, fill #0

## 2022-10-13 MED ORDER — ACETAMINOPHEN-CODEINE 300-30 MG PO TABS
1.0000 | ORAL_TABLET | Freq: Four times a day (QID) | ORAL | 0 refills | Status: DC
  Filled 2022-10-13: qty 12, 3d supply, fill #0

## 2022-10-20 ENCOUNTER — Other Ambulatory Visit: Payer: Self-pay

## 2022-10-20 MED ORDER — ACETAMINOPHEN-CODEINE 300-30 MG PO TABS
1.0000 | ORAL_TABLET | Freq: Four times a day (QID) | ORAL | 0 refills | Status: DC
  Filled 2022-10-20: qty 12, 3d supply, fill #0

## 2022-10-20 MED ORDER — IBUPROFEN 800 MG PO TABS
800.0000 mg | ORAL_TABLET | Freq: Four times a day (QID) | ORAL | 0 refills | Status: DC | PRN
  Filled 2022-10-20: qty 21, 6d supply, fill #0

## 2022-11-01 ENCOUNTER — Ambulatory Visit: Payer: Medicaid Other | Admitting: Family Medicine

## 2022-11-01 ENCOUNTER — Encounter: Payer: Self-pay | Admitting: Family Medicine

## 2022-11-01 VITALS — BP 115/67 | HR 79 | Temp 97.3°F | Ht 60.0 in | Wt 110.4 lb

## 2022-11-01 DIAGNOSIS — F17200 Nicotine dependence, unspecified, uncomplicated: Secondary | ICD-10-CM

## 2022-11-01 DIAGNOSIS — I10 Essential (primary) hypertension: Secondary | ICD-10-CM

## 2022-11-01 DIAGNOSIS — Z1211 Encounter for screening for malignant neoplasm of colon: Secondary | ICD-10-CM

## 2022-11-01 NOTE — Progress Notes (Incomplete)
Subjective   [name], female    DOB: [birth year]  Age: 47 y.o. MRN: [id number]  Chief Complaint  Patient presents with  . Hypertension    [name] is a 47 year old female with a medical  Hypertension This is a chronic problem. The problem is controlled. Pertinent negatives include no anxiety, blurred vision, chest pain, headaches, malaise/fatigue, neck pain, orthopnea, palpitations, peripheral edema, PND, shortness of breath or sweats. Risk factors for coronary artery disease include smoking/tobacco exposure. There are no compliance problems.     Patient Active Problem List   Diagnosis Date Noted  . Abscess of left axilla 03/12/2019  . Radiculopathy of cervicothoracic region 11/22/2016  . Essential hypertension 11/21/2016  . Injury of left shoulder 08/04/2016  . Muscle spasm of left shoulder 12/31/2015  . Left shoulder pain 12/31/2015  . Tobacco dependence 12/31/2015   Past Medical History:  Diagnosis Date  . Hypertension   . Left shoulder pain    Past Surgical History:  Procedure Laterality Date  . CESAREAN SECTION    . TUBAL LIGATION     Social History   Tobacco Use  . Smoking status: Every Day    Packs/day: 1.00    Years: 15.00    Additional pack years: 0.00    Total pack years: 15.00    Types: Cigarettes  . Smokeless tobacco: Never  . Tobacco comments:    down to a half pack  Vaping Use  . Vaping Use: Never used  Substance Use Topics  . Alcohol use: No  . Drug use: No   Social History   Socioeconomic History  . Marital status: Single    Spouse name: Not on file  . Number of children: Not on file  . Years of education: Not on file  . Highest education level: Not on file  Occupational History  . Not on file  Tobacco Use  . Smoking status: Every Day    Packs/day: 1.00    Years: 15.00    Additional pack years: 0.00    Total pack years: 15.00    Types: Cigarettes  . Smokeless tobacco: Never  . Tobacco comments:     down to a half pack  Vaping Use  . Vaping Use: Never used  Substance and Sexual Activity  . Alcohol use: No  . Drug use: No  . Sexual activity: Not Currently  Other Topics Concern  . Not on file  Social History Narrative  . Not on file   Social Determinants of Health   Financial Resource Strain: Not on file  Food Insecurity: Not on file  Transportation Needs: Not on file  Physical Activity: Not on file  Stress: Not on file  Social Connections: Not on file  Intimate Partner Violence: Not on file   Family Status  Relation Name Status  . Mother  Alive  . Father  Deceased   Family History  Problem Relation Age of Onset  . Cancer Father    No Known Allergies    Review of Systems  Constitutional: Negative.  Negative for malaise/fatigue.  HENT: Negative.    Eyes: Negative.  Negative for blurred vision.  Respiratory: Negative.  Negative for shortness of breath.   Cardiovascular: Negative.  Negative for chest pain, palpitations, orthopnea and PND.  Genitourinary: Negative.   Musculoskeletal: Negative.  Negative for neck pain.  Skin: Negative.   Neurological: Negative.  Negative for headaches.  Endo/Heme/Allergies: Negative.   Psychiatric/Behavioral: Negative.  Objective:     BP 115/67   Pulse 79   Temp (!) 97.3 F (36.3 C)   Ht 5' (1.524 m)   Wt 110 lb 6.4 oz (50.1 kg)   SpO2 100%   BMI 21.56 kg/m  BP Readings from Last 3 Encounters:  11/01/22 115/67  05/26/22 (!) 126/99  05/03/22 115/69   Wt Readings from Last 3 Encounters:  11/01/22 110 lb 6.4 oz (50.1 kg)  05/03/22 108 lb 6.4 oz (49.2 kg)  10/26/21 108 lb (49 kg)      Physical Exam Constitutional:      Appearance: Normal appearance.  Eyes:     Pupils: Pupils are equal, round, and reactive to light.  Cardiovascular:     Rate and Rhythm: Normal rate and regular rhythm.     Pulses: Normal pulses.  Pulmonary:     Effort: Pulmonary effort is normal.  Abdominal:     General: Bowel sounds  are normal.  Musculoskeletal:        General: Normal range of motion.  Skin:    General: Skin is warm.  Neurological:     General: No focal deficit present.     Mental Status: She is alert. Mental status is at baseline.  Psychiatric:        Mood and Affect: Mood normal.        Behavior: Behavior normal.        Thought Content: Thought content normal.        Judgment: Judgment normal.      No results found for any visits on 11/01/22.  Last CBC Lab Results  Component Value Date   WBC 4.7 10/05/2017   HGB 12.4 10/05/2017   HCT 49.1 (H) 04/27/2021   MCV 97.8 10/05/2017   MCH 33.3 10/05/2017   RDW 13.4 10/05/2017   PLT 217 10/05/2017   Last metabolic panel Lab Results  Component Value Date   GLUCOSE 82 05/03/2022   NA 140 05/03/2022   K 4.0 05/03/2022   CL 102 05/03/2022   CO2 24 05/03/2022   BUN 11 05/03/2022   CREATININE 0.99 05/03/2022   EGFR 71 05/03/2022   CALCIUM 9.1 05/03/2022   PROT 7.1 04/27/2021   ALBUMIN 4.7 04/27/2021   LABGLOB 2.4 04/27/2021   AGRATIO 2.0 04/27/2021   BILITOT 0.3 04/27/2021   ALKPHOS 57 04/27/2021   AST 18 04/27/2021   ALT 25 04/27/2021   ANIONGAP 7 10/05/2017   Last lipids Lab Results  Component Value Date   CHOL 119 06/16/2020   HDL 45 06/16/2020   LDLCALC 60 06/16/2020   TRIG 64 06/16/2020   CHOLHDL 2.6 06/16/2020   Last hemoglobin A1c Lab Results  Component Value Date   HGBA1C 4.6 08/04/2016   Last thyroid functions Lab Results  Component Value Date   TSH 1.970 04/27/2021   T4TOTAL 6.7 04/27/2021   Last vitamin D No results found for: "25OHVITD2", "25OHVITD3", "VD25OH" Last vitamin B12 and Folate Lab Results  Component Value Date   VITAMINB12 1,550 (H) 04/27/2021      The ASCVD Risk score (Arnett DK, et al., 2019) failed to calculate for the following reasons:   The valid total cholesterol range is 130 to 320 mg/dL    Assessment & Plan:   Problem List Items Addressed This Visit       Cardiovascular  and Mediastinum   Essential hypertension - Primary   Relevant Orders   Lipid Panel   Comprehensive metabolic panel     Other  Tobacco dependence   Other Visit Diagnoses     Colon cancer screening       Relevant Orders   Cologuard       Return in about 6 months (around 05/03/2023) for hypertension.     Nolon NationsLachina Moore Iyauna Sing  APRN, MSN, FNP-C Patient Care [facility] [address]  [place], [place] [id number] [phone]

## 2022-11-02 LAB — COMPREHENSIVE METABOLIC PANEL
ALT: 23 IU/L (ref 0–32)
AST: 17 IU/L (ref 0–40)
Albumin/Globulin Ratio: 2 (ref 1.2–2.2)
Albumin: 4.3 g/dL (ref 3.9–4.9)
Alkaline Phosphatase: 59 IU/L (ref 44–121)
BUN/Creatinine Ratio: 12 (ref 9–23)
BUN: 11 mg/dL (ref 6–24)
Bilirubin Total: 0.4 mg/dL (ref 0.0–1.2)
CO2: 20 mmol/L (ref 20–29)
Calcium: 8.8 mg/dL (ref 8.7–10.2)
Chloride: 106 mmol/L (ref 96–106)
Creatinine, Ser: 0.9 mg/dL (ref 0.57–1.00)
Globulin, Total: 2.2 g/dL (ref 1.5–4.5)
Glucose: 89 mg/dL (ref 70–99)
Potassium: 3.9 mmol/L (ref 3.5–5.2)
Sodium: 138 mmol/L (ref 134–144)
Total Protein: 6.5 g/dL (ref 6.0–8.5)
eGFR: 80 mL/min/{1.73_m2} (ref >59)

## 2022-11-02 LAB — LIPID PANEL
Chol/HDL Ratio: 2.6 ratio (ref 0.0–4.4)
Cholesterol, Total: 105 mg/dL (ref 100–199)
HDL: 40 mg/dL (ref >39)
LDL Chol Calc (NIH): 52 mg/dL (ref 0–99)
Triglycerides: 55 mg/dL (ref 0–149)
VLDL Cholesterol Cal: 13 mg/dL (ref 5–40)

## 2022-11-09 ENCOUNTER — Other Ambulatory Visit: Payer: Self-pay

## 2022-11-10 NOTE — Progress Notes (Signed)
Established Patient Office Visit  Subjective   [name], female    DOB: [birth year]  Age: 47 y.o. MRN: [id number]  Chief Complaint  Patient presents with   Hypertension    [name] is a very pleasant 47 year old female with a medical history of hypertension and tobacco dependence that presents for 23-month follow-up.  Patient says that she has been doing well and is without complaint on today.  She has been taking blood pressure medications consistently.  She does not check blood pressure at home.  She occasionally gets that checked at her job.  Blood pressure has been very well-controlled.  She does not exercise.  She denies any chest pain, shortness of breath, urinary symptoms, or lower extremity swelling.  Patient also has a history of tobacco abuse.  She has decreased her smoking some, but has not quit.  She continues to attempt to find strategies to quit smoking.  Hypertension This is a chronic problem. The problem is controlled. Pertinent negatives include no anxiety, blurred vision, chest pain, headaches, malaise/fatigue, neck pain, orthopnea, palpitations, peripheral edema, PND, shortness of breath or sweats. There is no history of a hypertension causing med, pheochromocytoma, renovascular disease, sleep apnea or a thyroid problem.    Patient Active Problem List   Diagnosis Date Noted   Abscess of left axilla 03/12/2019   Radiculopathy of cervicothoracic region 11/22/2016   Essential hypertension 11/21/2016   Injury of left shoulder 08/04/2016   Muscle spasm of left shoulder 12/31/2015   Left shoulder pain 12/31/2015   Tobacco dependence 12/31/2015   Past Medical History:  Diagnosis Date   Hypertension    Left shoulder pain    Past Surgical History:  Procedure Laterality Date   CESAREAN SECTION     TUBAL LIGATION     Social History   Tobacco Use   Smoking status: Every Day    Packs/day: 1.00    Years: 15.00    Additional pack years: 0.00     Total pack years: 15.00    Types: Cigarettes   Smokeless tobacco: Never   Tobacco comments:    down to a half pack  Vaping Use   Vaping Use: Never used  Substance Use Topics   Alcohol use: No   Drug use: No   Social History   Socioeconomic History   Marital status: Single    Spouse name: Not on file   Number of children: Not on file   Years of education: Not on file   Highest education level: Not on file  Occupational History   Not on file  Tobacco Use   Smoking status: Every Day    Packs/day: 1.00    Years: 15.00    Additional pack years: 0.00    Total pack years: 15.00    Types: Cigarettes   Smokeless tobacco: Never   Tobacco comments:    down to a half pack  Vaping Use   Vaping Use: Never used  Substance and Sexual Activity   Alcohol use: No   Drug use: No   Sexual activity: Not Currently  Other Topics Concern   Not on file  Social History Narrative   Not on file   Social Determinants of Health   Financial Resource Strain: Not on file  Food Insecurity: Not on file  Transportation Needs: Not on file  Physical Activity: Not on file  Stress: Not on file  Social Connections: Not on file  Intimate Partner Violence: Not on file  Family Status  Relation Name Status   Mother  Alive   Father  Deceased   Family History  Problem Relation Age of Onset   Cancer Father    No Known Allergies    Review of Systems  Constitutional:  Negative for malaise/fatigue.  Eyes:  Negative for blurred vision.  Respiratory:  Negative for shortness of breath.   Cardiovascular:  Negative for chest pain, palpitations, orthopnea and PND.  Musculoskeletal:  Negative for neck pain.  Neurological:  Negative for headaches.      Objective:     BP 115/67   Pulse 79   Temp (!) 97.3 F (36.3 C)   Ht 5' (1.524 m)   Wt 110 lb 6.4 oz (50.1 kg)   SpO2 100%   BMI 21.56 kg/m  BP Readings from Last 3 Encounters:  11/01/22 115/67  05/26/22 (!) 126/99  05/03/22 115/69   Wt  Readings from Last 3 Encounters:  11/01/22 110 lb 6.4 oz (50.1 kg)  05/03/22 108 lb 6.4 oz (49.2 kg)  10/26/21 108 lb (49 kg)      Physical Exam Constitutional:      Appearance: Normal appearance.  Eyes:     Pupils: Pupils are equal, round, and reactive to light.  Cardiovascular:     Rate and Rhythm: Normal rate.  Pulmonary:     Effort: Pulmonary effort is normal.  Abdominal:     General: Bowel sounds are normal.  Musculoskeletal:        General: Normal range of motion.  Skin:    General: Skin is warm.  Neurological:     General: No focal deficit present.     Mental Status: Mental status is at baseline.  Psychiatric:        Mood and Affect: Mood normal.        Behavior: Behavior normal.        Thought Content: Thought content normal.        Judgment: Judgment normal.      Results for orders placed or performed in visit on 11/01/22  Lipid Panel  Result Value Ref Range   Cholesterol, Total 105 100 - 199 mg/dL   Triglycerides 55 0 - 149 mg/dL   HDL 40 >16 mg/dL   VLDL Cholesterol Cal 13 5 - 40 mg/dL   LDL Chol Calc (NIH) 52 0 - 99 mg/dL   Chol/HDL Ratio 2.6 0.0 - 4.4 ratio  Comprehensive metabolic panel  Result Value Ref Range   Glucose 89 70 - 99 mg/dL   BUN 11 6 - 24 mg/dL   Creatinine, Ser 1.09 0.57 - 1.00 mg/dL   eGFR 80 >60 AV/WUJ/8.11   BUN/Creatinine Ratio 12 9 - 23   Sodium 138 134 - 144 mmol/L   Potassium 3.9 3.5 - 5.2 mmol/L   Chloride 106 96 - 106 mmol/L   CO2 20 20 - 29 mmol/L   Calcium 8.8 8.7 - 10.2 mg/dL   Total Protein 6.5 6.0 - 8.5 g/dL   Albumin 4.3 3.9 - 4.9 g/dL   Globulin, Total 2.2 1.5 - 4.5 g/dL   Albumin/Globulin Ratio 2.0 1.2 - 2.2   Bilirubin Total 0.4 0.0 - 1.2 mg/dL   Alkaline Phosphatase 59 44 - 121 IU/L   AST 17 0 - 40 IU/L   ALT 23 0 - 32 IU/L    Last CBC Lab Results  Component Value Date   WBC 4.7 10/05/2017   HGB 12.4 10/05/2017   HCT 49.1 (H) 04/27/2021  MCV 97.8 10/05/2017   MCH 33.3 10/05/2017   RDW 13.4  10/05/2017   PLT 217 10/05/2017   Last metabolic panel Lab Results  Component Value Date   GLUCOSE 89 11/01/2022   NA 138 11/01/2022   K 3.9 11/01/2022   CL 106 11/01/2022   CO2 20 11/01/2022   BUN 11 11/01/2022   CREATININE 0.90 11/01/2022   EGFR 80 11/01/2022   CALCIUM 8.8 11/01/2022   PROT 6.5 11/01/2022   ALBUMIN 4.3 11/01/2022   LABGLOB 2.2 11/01/2022   AGRATIO 2.0 11/01/2022   BILITOT 0.4 11/01/2022   ALKPHOS 59 11/01/2022   AST 17 11/01/2022   ALT 23 11/01/2022   ANIONGAP 7 10/05/2017   Last lipids Lab Results  Component Value Date   CHOL 105 11/01/2022   HDL 40 11/01/2022   LDLCALC 52 11/01/2022   TRIG 55 11/01/2022   CHOLHDL 2.6 11/01/2022   Last hemoglobin A1c Lab Results  Component Value Date   HGBA1C 4.6 08/04/2016   Last thyroid functions Lab Results  Component Value Date   TSH 1.970 04/27/2021   T4TOTAL 6.7 04/27/2021   Last vitamin D No results found for: "25OHVITD2", "25OHVITD3", "VD25OH" Last vitamin B12 and Folate Lab Results  Component Value Date   VITAMINB12 1,550 (H) 04/27/2021      The ASCVD Risk score (Arnett DK, et al., 2019) failed to calculate for the following reasons:   The valid total cholesterol range is 130 to 320 mg/dL    Assessment & Plan:   Problem List Items Addressed This Visit       Cardiovascular and Mediastinum   Essential hypertension - Primary   Relevant Orders   Lipid Panel (Completed)   Comprehensive metabolic panel (Completed)     Other   Tobacco dependence   Other Visit Diagnoses     Colon cancer screening       Relevant Orders   Cologuard     1. Essential hypertension BP 115/67   Pulse 79   Temp (!) 97.3 F (36.3 C)   Ht 5' (1.524 m)   Wt 110 lb 6.4 oz (50.1 kg)   SpO2 100%   BMI 21.56 kg/m  - Continue medication, monitor blood pressure at home. Continue DASH diet.  Reminder to go to the ER if any CP, SOB, nausea, dizziness, severe HA, changes vision/speech, left arm numbness and  tingling and jaw pain.   - Lipid Panel - Comprehensive metabolic panel  2. Tobacco dependence Smoking cessation instruction/counseling given:  counseled patient on the dangers of tobacco use, advised patient to stop smoking, and reviewed strategies to maximize success   3. Colon cancer screening  - Cologuard   Return in about 6 months (around 05/03/2023) for hypertension.   Nolon Nations  APRN, MSN, FNP-C Patient Care [facility] [address] [place], [place] [address] [phone]

## 2022-11-20 ENCOUNTER — Ambulatory Visit (HOSPITAL_COMMUNITY)
Admission: EM | Admit: 2022-11-20 | Discharge: 2022-11-20 | Disposition: A | Discharge status: Home / Self Care | Payer: Medicaid Other | Attending: Emergency Medicine | Admitting: Emergency Medicine

## 2022-11-20 ENCOUNTER — Encounter (HOSPITAL_COMMUNITY): Payer: Self-pay | Admitting: Emergency Medicine

## 2022-11-20 DIAGNOSIS — T2692XA Corrosion of left eye and adnexa, part unspecified, initial encounter: Secondary | ICD-10-CM | POA: Diagnosis not present

## 2022-11-20 MED ORDER — HYPROMELLOSE (GONIOSCOPIC) 2.5 % OP SOLN: 1.0000 [drp] | Freq: Three times a day (TID) | OPHTHALMIC | 12 refills | Status: DC | PRN

## 2022-11-20 MED ORDER — GENTAMICIN SULFATE 0.3 % OP SOLN: 1.0000 [drp] | OPHTHALMIC | 0 refills | Status: AC

## 2022-11-20 MED ORDER — ARTIFICIAL TEARS OPHTHALMIC OINT: TOPICAL_OINTMENT | OPHTHALMIC | 1 refills | Status: AC | PRN

## 2022-11-20 NOTE — Discharge Instructions (Addendum)
Please use the artificial tears every 4 hours as needed for dry eyes.  Please also use the gentamicin eyedrops every 4 hours for the next 7 days to help prevent against bacterial infection.  If your symptoms do not improve over the next 24 to 48 hours, please follow-up with an ophthalmologist, I have attached one for you.  Please seek immediate care if you develop vision loss, your eyes washed out, or you have any new concerning symptoms.
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
## 2022-12-09 ENCOUNTER — Other Ambulatory Visit: Payer: Self-pay

## 2023-02-10 ENCOUNTER — Other Ambulatory Visit: Payer: Self-pay

## 2023-02-16 ENCOUNTER — Encounter (HOSPITAL_COMMUNITY): Payer: Self-pay | Admitting: Emergency Medicine

## 2023-02-16 ENCOUNTER — Ambulatory Visit (HOSPITAL_COMMUNITY)
Admission: EM | Admit: 2023-02-16 | Discharge: 2023-02-16 | Disposition: A | Discharge status: Home / Self Care | Payer: Medicaid Other | Attending: Family Medicine | Admitting: Family Medicine

## 2023-02-16 DIAGNOSIS — R109 Unspecified abdominal pain: Secondary | ICD-10-CM | POA: Insufficient documentation

## 2023-02-16 DIAGNOSIS — R10A0 Flank pain, unspecified side: Secondary | ICD-10-CM

## 2023-02-16 DIAGNOSIS — N10 Acute tubulo-interstitial nephritis: Secondary | ICD-10-CM

## 2023-02-16 LAB — POCT URINE PREGNANCY: Preg Test, Ur: NEGATIVE

## 2023-02-16 LAB — POCT URINALYSIS DIP (MANUAL ENTRY)
Bilirubin, UA: NEGATIVE
Glucose, UA: NEGATIVE mg/dL
Ketones, POC UA: NEGATIVE mg/dL
Leukocytes, UA: NEGATIVE
Nitrite, UA: NEGATIVE
Protein Ur, POC: 100 mg/dL — AB
Spec Grav, UA: >=1.03 — AB (ref 1.010–1.025)
Urobilinogen, UA: 0.2 E.U./dL
pH, UA: 6 (ref 5.0–8.0)

## 2023-02-16 MED ORDER — CIPROFLOXACIN HCL 500 MG PO TABS: 500.0000 mg | ORAL_TABLET | Freq: Two times a day (BID) | ORAL | 0 refills | Status: AC

## 2023-02-16 MED ORDER — PHENAZOPYRIDINE HCL 100 MG PO TABS: 100.0000 mg | ORAL_TABLET | Freq: Three times a day (TID) | ORAL | 0 refills | Status: DC | PRN

## 2023-02-16 NOTE — ED Provider Notes (Signed)
[facility]    CSN: [id number] Arrival date & time: 02/16/23  1913      History   Chief Complaint Chief Complaint  Patient presents with   Urinary Frequency   Flank Pain    HPI [name] is a 47 y.o. female.    Urinary Frequency  Flank Pain  Here for dysuria and urinary frequency that began on July 23.  Then today she began having right-sided flank pain.  No fever or chills and no nausea or vomiting.  The flank pain is not radiating around.  Last menstrual cycle was June 23 p No allergies to medications  Past Medical History:  Diagnosis Date   Hypertension    Left shoulder pain     Patient Active Problem List   Diagnosis Date Noted   Abscess of left axilla 03/12/2019   Radiculopathy of cervicothoracic region 11/22/2016   Essential hypertension 11/21/2016   Injury of left shoulder 08/04/2016   Muscle spasm of left shoulder 12/31/2015   Left shoulder pain 12/31/2015   Tobacco dependence 12/31/2015    Past Surgical History:  Procedure Laterality Date   CESAREAN SECTION     TUBAL LIGATION      OB History   No obstetric history on file.      Home Medications    Prior to Admission medications   Medication Sig Start Date End Date Taking? Authorizing Provider  ciprofloxacin (CIPRO) 500 MG tablet Take 1 tablet (500 mg total) by mouth 2 (two) times daily for 7 days. 02/16/23 02/23/23 Yes Zenia Resides, MD  phenazopyridine (PYRIDIUM) 100 MG tablet Take 1 tablet (100 mg total) by mouth 3 (three) times daily as needed (urinary pain). 02/16/23  Yes Zenia Resides, MD  acetaminophen (TYLENOL) 500 MG tablet Take 2 tablets (1,000 mg total) by mouth every 6 (six) hours as needed. 05/03/22   Massie Maroon, FNP  acetaminophen-codeine (TYLENOL #3) 300-30 MG tablet Take 1 tablet by mouth every 6 (six) hours. Patient not taking: Reported on 11/01/2022 10/20/22     amLODipine (NORVASC) 5 MG tablet Take 1 tablet (5 mg total) by mouth daily. 05/03/22    Massie Maroon, FNP  hydroxypropyl methylcellulose / hypromellose (ISOPTO TEARS / GONIOVISC) 2.5 % ophthalmic solution Place 1 drop into the left eye 3 (three) times daily as needed for dry eyes. 11/20/22   Garrison, [place] N, FNP  lidocaine (LIDODERM) 5 % Place 1 patch onto the skin daily. Remove & Discard patch within 12 hours or as directed by MD    [provider]  loratadine (CLARITIN) 10 MG tablet Take 1 tablet (10 mg total) by mouth daily. 05/03/22   Massie Maroon, FNP  methocarbamol (ROBAXIN) 500 MG tablet Take 1 tablet (500 mg total) by mouth every 6 (six) hours as needed for muscle spasms. 05/26/22   Arby Barrette, MD    Family History Family History  Problem Relation Age of Onset   Cancer Father     Social History Social History   Tobacco Use   Smoking status: Every Day    Current packs/day: 1.00    Average packs/day: 1 pack/day for 15.0 years (15.0 ttl pk-yrs)    Types: Cigarettes   Smokeless tobacco: Never   Tobacco comments:    down to a half pack  Vaping Use   Vaping status: Never Used  Substance Use Topics   Alcohol use: No   Drug use: No     Allergies   Patient  has no known allergies.   Review of Systems Review of Systems  Genitourinary:  Positive for flank pain and frequency.     Physical Exam Triage Vital Signs ED Triage Vitals  Encounter Vitals Group     BP 02/16/23 1955 120/78     Systolic BP Percentile --      Diastolic BP Percentile --      Pulse Rate 02/16/23 1955 74     Resp 02/16/23 1955 14     Temp 02/16/23 1955 98.5 F (36.9 C)     Temp Source 02/16/23 1955 Oral     SpO2 02/16/23 1955 98 %     Weight --      Height --      Head Circumference --      Peak Flow --      Pain Score 02/16/23 1954 8     Pain Loc --      Pain Education --      Exclude from Growth Chart --    No data found.  Updated Vital Signs BP 120/78 (BP Location: Left Arm)   Pulse 74   Temp 98.5 F (36.9 C) (Oral)   Resp 14   LMP  01/15/2023   SpO2 98%   Visual Acuity Right Eye Distance:   Left Eye Distance:   Bilateral Distance:    Right Eye Near:   Left Eye Near:    Bilateral Near:     Physical Exam Vitals reviewed.  Constitutional:      General: She is not in acute distress.    Appearance: She is not ill-appearing, toxic-appearing or diaphoretic.  HENT:     Mouth/Throat:     Mouth: Mucous membranes are moist.  Eyes:     Extraocular Movements: Extraocular movements intact.     Conjunctiva/sclera: Conjunctivae normal.     Pupils: Pupils are equal, round, and reactive to light.  Cardiovascular:     Rate and Rhythm: Normal rate and regular rhythm.     Heart sounds: No murmur heard. Pulmonary:     Effort: Pulmonary effort is normal.     Breath sounds: Normal breath sounds.  Abdominal:     General: There is no distension.     Palpations: Abdomen is soft. There is no mass.     Tenderness: There is no abdominal tenderness. There is right CVA tenderness. There is no guarding.  Musculoskeletal:     Cervical back: Neck supple.  Lymphadenopathy:     Cervical: No cervical adenopathy.  Skin:    Capillary Refill: Capillary refill takes less than 2 seconds.     Coloration: Skin is not jaundiced or pale.     Findings: No rash.  Neurological:     General: No focal deficit present.     Mental Status: She is alert and oriented to person, place, and time.  Psychiatric:        Behavior: Behavior normal.      UC Treatments / Results  Labs (all labs ordered are listed, but only abnormal results are displayed) Labs Reviewed  POCT URINALYSIS DIP (MANUAL ENTRY) - Abnormal; Notable for the following components:      Result Value   Clarity, UA cloudy (*)    Spec Grav, UA >=1.030 (*)    Blood, UA large (*)    Protein Ur, POC =100 (*)    All other components within normal limits  URINE CULTURE  POCT URINE PREGNANCY    EKG   Radiology No results found.  Procedures Procedures (including critical care  time)  Medications Ordered in UC Medications - No data to display  Initial Impression / Assessment and Plan / UC Course  I have reviewed the triage vital signs and the nursing notes.  Pertinent labs & imaging results that were available during my care of the patient were reviewed by me and considered in my medical decision making (see chart for details).        Urinalysis shows a large amount of RBCs.  No leuks or nitrites.  Urine culture is sent  Cipro was sent in for potential pyelonephritis and Pyridium sent in for the burning  If she is improved but her urine culture is negative, I would have her finish the Cipro for possible pyelonephritis.  If her culture is negative and she has not improved by the time the culture results, I would consider this possibly a renal stone.  If she is worsening anyway she is going to go to the emergency room Final Clinical Impressions(s) / UC Diagnoses   Final diagnoses:  Acute pyelonephritis  Flank pain     Discharge Instructions      The urinalysis showed large amount of red blood cells.  This could be consistent with urinary infection, or kidney stone  .Take Cipro 500 mg--1 tablet 2 times daily for 7 days  Take Pyridium/phenazopyridine 100 mg--1 tablet 3 times daily as needed for urinary pain.  This medication usually makes the urine orange  Make sure you are drinking enough fluids.  Urine culture is sent, and staff will let you know if any of your treatment needs to be changed  If you worsen in any way, then please go to the emergency room     ED Prescriptions     Medication Sig Dispense Auth. Provider   ciprofloxacin (CIPRO) 500 MG tablet Take 1 tablet (500 mg total) by mouth 2 (two) times daily for 7 days. 14 tablet Brandilee Pies, Janace Aris, MD   phenazopyridine (PYRIDIUM) 100 MG tablet Take 1 tablet (100 mg total) by mouth 3 (three) times daily as needed (urinary pain). 10 tablet Marlinda Mike Janace Aris, MD      PDMP not reviewed  this encounter.   Zenia Resides, MD 02/16/23 2017

## 2023-02-16 NOTE — ED Triage Notes (Signed)
Pt reports having urinary frequency and now right flank pain for a couple days.

## 2023-02-16 NOTE — Discharge Instructions (Signed)
The urinalysis showed large amount of red blood cells.  This could be consistent with urinary infection, or kidney stone  .Take Cipro 500 mg--1 tablet 2 times daily for 7 days  Take Pyridium/phenazopyridine 100 mg--1 tablet 3 times daily as needed for urinary pain.  This medication usually makes the urine orange  Make sure you are drinking enough fluids.  Urine culture is sent, and staff will let you know if any of your treatment needs to be changed  If you worsen in any way, then please go to the emergency room

## 2023-02-20 ENCOUNTER — Other Ambulatory Visit: Payer: Self-pay | Admitting: Family Medicine

## 2023-02-20 DIAGNOSIS — I10 Essential (primary) hypertension: Secondary | ICD-10-CM

## 2023-02-20 MED ORDER — AMLODIPINE BESYLATE 5 MG PO TABS
5.0000 mg | ORAL_TABLET | Freq: Every day | ORAL | 2 refills | Status: DC
Start: 2023-02-20 — End: 2023-11-02
  Filled 2023-02-20: qty 90, 90d supply, fill #0
  Filled 2023-05-19: qty 90, 90d supply, fill #1
  Filled 2023-08-14: qty 90, 90d supply, fill #2

## 2023-02-21 ENCOUNTER — Other Ambulatory Visit (HOSPITAL_COMMUNITY): Payer: Self-pay

## 2023-02-21 ENCOUNTER — Other Ambulatory Visit: Payer: Self-pay

## 2023-02-22 ENCOUNTER — Other Ambulatory Visit: Payer: Self-pay

## 2023-04-12 ENCOUNTER — Other Ambulatory Visit: Payer: Self-pay

## 2023-05-02 ENCOUNTER — Encounter: Payer: Self-pay | Admitting: Nurse Practitioner

## 2023-05-02 ENCOUNTER — Ambulatory Visit: Payer: Self-pay | Admitting: Family Medicine

## 2023-05-02 ENCOUNTER — Ambulatory Visit (INDEPENDENT_AMBULATORY_CARE_PROVIDER_SITE_OTHER): Payer: Medicaid Other | Admitting: Nurse Practitioner

## 2023-05-02 VITALS — BP 103/75 | HR 74 | Ht 60.0 in | Wt 108.0 lb

## 2023-05-02 DIAGNOSIS — Z1231 Encounter for screening mammogram for malignant neoplasm of breast: Secondary | ICD-10-CM

## 2023-05-02 DIAGNOSIS — Z23 Encounter for immunization: Secondary | ICD-10-CM | POA: Diagnosis not present

## 2023-05-02 DIAGNOSIS — I10 Essential (primary) hypertension: Secondary | ICD-10-CM

## 2023-05-02 DIAGNOSIS — F17200 Nicotine dependence, unspecified, uncomplicated: Secondary | ICD-10-CM

## 2023-05-02 NOTE — Progress Notes (Addendum)
Established Patient Office Visit  Subjective:  [name], female    DOB: [birth year]  Age: 47 y.o. MRN: [id number]  CC:  Chief Complaint  Patient presents with   Hypertension   Flu Vaccine    HPI [name] is a 47 y.o. female  has a past medical history of Hypertension, Left shoulder pain, and Tobacco dependence (12/31/2015).  Patient presents for follow-up for her chronic medical conditions  Please see assessment and plan section for full HPI  Mammogram ordered to screen for breast cancer, flu vaccine given in the office today.  She has received the Cologuard test kit but has not completed the test.  Patient encouraged to complete the Cologuard test as ordered.  She denies any adverse reactions to her current medications  Past Medical History:  Diagnosis Date   Hypertension    Left shoulder pain    Tobacco dependence 12/31/2015    Past Surgical History:  Procedure Laterality Date   CESAREAN SECTION     TUBAL LIGATION      Family History  Problem Relation Age of Onset   Cancer Father     Social History   Socioeconomic History   Marital status: Single    Spouse name: Not on file   Number of children: Not on file   Years of education: Not on file   Highest education level: Not on file  Occupational History   Not on file  Tobacco Use   Smoking status: Every Day    Current packs/day: 1.00    Average packs/day: 1 pack/day for 15.0 years (15.0 ttl pk-yrs)    Types: Cigarettes   Smokeless tobacco: Never   Tobacco comments:    down to a half pack  Vaping Use   Vaping status: Never Used  Substance and Sexual Activity   Alcohol use: No   Drug use: No   Sexual activity: Not Currently  Other Topics Concern   Not on file  Social History Narrative   Not on file   Social Determinants of Health   Financial Resource Strain: Not on file  Food Insecurity: Not on file  Transportation Needs: Not on file  Physical Activity: Not on file   Stress: Not on file  Social Connections: Not on file  Intimate Partner Violence: Not on file    Outpatient Medications Prior to Visit  Medication Sig Dispense Refill   acetaminophen (TYLENOL) 500 MG tablet Take 2 tablets (1,000 mg total) by mouth every 6 (six) hours as needed. 60 tablet 2   amLODipine (NORVASC) 5 MG tablet Take 1 tablet (5 mg total) by mouth daily. 90 tablet 2   lidocaine (LIDODERM) 5 % Place 1 patch onto the skin daily. Remove & Discard patch within 12 hours or as directed by MD     loratadine (CLARITIN) 10 MG tablet Take 1 tablet (10 mg total) by mouth daily. 30 tablet 11   methocarbamol (ROBAXIN) 500 MG tablet Take 1 tablet (500 mg total) by mouth every 6 (six) hours as needed for muscle spasms. (Patient not taking: Reported on 05/02/2023) 20 tablet 0   acetaminophen-codeine (TYLENOL #3) 300-30 MG tablet Take 1 tablet by mouth every 6 (six) hours. (Patient not taking: Reported on 11/01/2022) 12 tablet 0   hydroxypropyl methylcellulose / hypromellose (ISOPTO TEARS / GONIOVISC) 2.5 % ophthalmic solution Place 1 drop into the left eye 3 (three) times daily as needed for dry eyes. 15 mL 12   phenazopyridine (PYRIDIUM) 100 MG  tablet Take 1 tablet (100 mg total) by mouth 3 (three) times daily as needed (urinary pain). 10 tablet 0   No facility-administered medications prior to visit.    No Known Allergies  ROS Review of Systems  Constitutional:  Negative for appetite change, chills, fatigue and fever.  HENT:  Negative for congestion, postnasal drip, rhinorrhea and sneezing.   Respiratory:  Negative for cough, shortness of breath and wheezing.   Cardiovascular:  Negative for chest pain, palpitations and leg swelling.  Gastrointestinal:  Negative for abdominal pain, constipation, nausea and vomiting.  Genitourinary:  Negative for difficulty urinating, dysuria, flank pain and frequency.  Musculoskeletal:  Negative for arthralgias, back pain, joint swelling and myalgias.  Skin:   Negative for color change, pallor, rash and wound.  Neurological:  Negative for dizziness, facial asymmetry, weakness, numbness and headaches.  Psychiatric/Behavioral:  Negative for behavioral problems, confusion, self-injury and suicidal ideas.       Objective:    Physical Exam Vitals and nursing note reviewed.  Constitutional:      General: She is not in acute distress.    Appearance: Normal appearance. She is not ill-appearing, toxic-appearing or diaphoretic.  HENT:     Mouth/Throat:     Mouth: Mucous membranes are moist.     Pharynx: Oropharynx is clear. No oropharyngeal exudate or posterior oropharyngeal erythema.  Eyes:     General: No scleral icterus.       Right eye: No discharge.        Left eye: No discharge.     Extraocular Movements: Extraocular movements intact.     Conjunctiva/sclera: Conjunctivae normal.  Cardiovascular:     Rate and Rhythm: Normal rate and regular rhythm.     Pulses: Normal pulses.     Heart sounds: Normal heart sounds. No murmur heard.    No friction rub. No gallop.  Pulmonary:     Effort: Pulmonary effort is normal. No respiratory distress.     Breath sounds: Normal breath sounds. No stridor. No wheezing, rhonchi or rales.  Chest:     Chest wall: No tenderness.  Abdominal:     General: There is no distension.     Palpations: Abdomen is soft.     Tenderness: There is no abdominal tenderness. There is no right CVA tenderness, left CVA tenderness or guarding.  Musculoskeletal:        General: No swelling, tenderness, deformity or signs of injury.     Right lower leg: No edema.     Left lower leg: No edema.  Skin:    General: Skin is warm and dry.     Capillary Refill: Capillary refill takes less than 2 seconds.     Coloration: Skin is not jaundiced or pale.     Findings: No bruising, erythema or lesion.  Neurological:     Mental Status: She is alert and oriented to person, place, and time.     Motor: No weakness.     Coordination:  Coordination normal.     Gait: Gait normal.  Psychiatric:        Mood and Affect: Mood normal.        Behavior: Behavior normal.        Thought Content: Thought content normal.        Judgment: Judgment normal.     BP 103/75   Pulse 74   Ht 5' (1.524 m)   Wt 108 lb (49 kg)   SpO2 100%   BMI 21.09 kg/m  Wt  Readings from Last 3 Encounters:  05/02/23 108 lb (49 kg)  11/01/22 110 lb 6.4 oz (50.1 kg)  05/03/22 108 lb 6.4 oz (49.2 kg)    Lab Results  Component Value Date   TSH 1.970 04/27/2021   Lab Results  Component Value Date   WBC 4.7 10/05/2017   HGB 12.4 10/05/2017   HCT 49.1 (H) 04/27/2021   MCV 97.8 10/05/2017   PLT 217 10/05/2017   Lab Results  Component Value Date   NA 138 11/01/2022   K 3.9 11/01/2022   CO2 20 11/01/2022   GLUCOSE 89 11/01/2022   BUN 11 11/01/2022   CREATININE 0.90 11/01/2022   BILITOT 0.4 11/01/2022   ALKPHOS 59 11/01/2022   AST 17 11/01/2022   ALT 23 11/01/2022   PROT 6.5 11/01/2022   ALBUMIN 4.3 11/01/2022   CALCIUM 8.8 11/01/2022   ANIONGAP 7 10/05/2017   EGFR 80 11/01/2022   Lab Results  Component Value Date   CHOL 105 11/01/2022   Lab Results  Component Value Date   HDL 40 11/01/2022   Lab Results  Component Value Date   LDLCALC 52 11/01/2022   Lab Results  Component Value Date   TRIG 55 11/01/2022   Lab Results  Component Value Date   CHOLHDL 2.6 11/01/2022   Lab Results  Component Value Date   HGBA1C 4.6 08/04/2016      Assessment & Plan:   Problem List Items Addressed This Visit       Cardiovascular and Mediastinum   Essential hypertension - Primary    BP Readings from Last 3 Encounters:  05/02/23 103/75  02/16/23 120/78  11/20/22 120/77   HTN Controlled .  On amlodipine 5 mg daily Patient denies chest pain, shortness of breath, edema, states that she has been following a heart healthy diet but does not exercise Continue current medications. No changes in management. Discussed DASH diet and  dietary sodium restrictions Encouraged to increase dietary efforts and exercise.  CMP today Follow-up in 6 months        Relevant Orders   CMP14+EGFR     Other   Tobacco dependence    Smokes about 1 pack/day. Started smoking at age 32.  Denies shortness of breath cough wheezing  Asked about quitting: confirms that he/she currently smokes cigarettes Advise to quit smoking: Educated about QUITTING to reduce the risk of cancer, cardio and cerebrovascular disease. Assess willingness: Unwilling to quit at this time, but is working on cutting back. Assist with counseling and pharmacotherapy: Counseled for 5 minutes and literature provided. Arrange for follow up: follow up in 6 months and continue to offer help.       Need for influenza vaccination    Patient educated on CDC recommendation for the vaccine. Verbal consent was obtained from the patient, vaccine administered by nurse, no sign of adverse reactions noted at this time. Patient education on arm soreness and use of tylenol  for this patient  was discussed. Patient educated on the signs and symptoms of adverse effect and advise to contact the office if they occur. Vaccine information sheet given to patient.        Relevant Orders   Flu vaccine trivalent PF, 6mos and older(Flulaval,Afluria,Fluarix,Fluzone) (Completed)   Other Visit Diagnoses     Screening mammogram for breast cancer       Relevant Orders   MM Digital Screening       No orders of the defined types were placed in this encounter.  Follow-up: Return in about 6 months (around 10/31/2023) for CPE.    Donell Beers, FNP

## 2023-05-02 NOTE — Assessment & Plan Note (Addendum)
Smokes about 1 pack/day. Started smoking at age 47.  Denies shortness of breath cough wheezing  Asked about quitting: confirms that he/she currently smokes cigarettes Advise to quit smoking: Educated about QUITTING to reduce the risk of cancer, cardio and cerebrovascular disease. Assess willingness: Unwilling to quit at this time, but is working on cutting back. Assist with counseling and pharmacotherapy: Counseled for 5 minutes and literature provided. Arrange for follow up: follow up in 6 months and continue to offer help.

## 2023-05-02 NOTE — Assessment & Plan Note (Addendum)
BP Readings from Last 3 Encounters:  05/02/23 103/75  02/16/23 120/78  11/20/22 120/77   HTN Controlled .  On amlodipine 5 mg daily Patient denies chest pain, shortness of breath, edema, states that she has been following a heart healthy diet but does not exercise Continue current medications. No changes in management. Discussed DASH diet and dietary sodium restrictions Encouraged to increase dietary efforts and exercise.  CMP today Follow-up in 6 months

## 2023-05-02 NOTE — Patient Instructions (Signed)
   Screening mammogram for breast cancer  - MM Digital Screening; Future  Please call [phone] to schedule your mammogram  It is important that you exercise regularly at least 30 minutes 5 times a week as tolerated  Think about what you will eat, plan ahead. Choose " clean, green, fresh or frozen" over canned, processed or packaged foods which are more sugary, salty and fatty. 70 to 75% of food eaten should be vegetables and fruit. Three meals at set times with snacks allowed between meals, but they must be fruit or vegetables. Aim to eat over a 12 hour period , example 7 am to 7 pm, and STOP after  your last meal of the day. Drink water,generally about 64 ounces per day, no other drink is as healthy. Fruit juice is best enjoyed in a healthy way, by EATING the fruit.  Thanks for choosing [facility] we consider it a privelige to serve you.

## 2023-05-02 NOTE — Assessment & Plan Note (Signed)
Patient educated on CDC recommendation for the vaccine. Verbal consent was obtained from the patient, vaccine administered by nurse, no sign of adverse reactions noted at this time. Patient education on arm soreness and use of tylenol  for this patient  was discussed. Patient educated on the signs and symptoms of adverse effect and advise to contact the office if they occur. Vaccine information sheet given to patient.  

## 2023-05-03 ENCOUNTER — Telehealth: Payer: Self-pay

## 2023-05-03 ENCOUNTER — Other Ambulatory Visit: Payer: Self-pay | Admitting: Nurse Practitioner

## 2023-05-03 DIAGNOSIS — N179 Acute kidney failure, unspecified: Secondary | ICD-10-CM

## 2023-05-03 LAB — CMP14+EGFR
ALT: 21 [IU]/L (ref 0–32)
AST: 14 [IU]/L (ref 0–40)
Albumin: 3.9 g/dL (ref 3.9–4.9)
Alkaline Phosphatase: 46 [IU]/L (ref 44–121)
BUN/Creatinine Ratio: 11 (ref 9–23)
BUN: 13 mg/dL (ref 6–24)
Bilirubin Total: 0.5 mg/dL (ref 0.0–1.2)
CO2: 20 mmol/L (ref 20–29)
Calcium: 8.8 mg/dL (ref 8.7–10.2)
Chloride: 107 mmol/L — ABNORMAL HIGH (ref 96–106)
Creatinine, Ser: 1.23 mg/dL — ABNORMAL HIGH (ref 0.57–1.00)
Globulin, Total: 2.3 g/dL (ref 1.5–4.5)
Glucose: 66 mg/dL — ABNORMAL LOW (ref 70–99)
Potassium: 4.1 mmol/L (ref 3.5–5.2)
Sodium: 141 mmol/L (ref 134–144)
Total Protein: 6.2 g/dL (ref 6.0–8.5)
eGFR: 55 mL/min/{1.73_m2} — ABNORMAL LOW (ref >59)

## 2023-05-03 NOTE — Telephone Encounter (Signed)
-----   Message from [place] sent at 05/03/2023  8:14 AM EDT ----- Your kidney function is slightly lower than your previous labs, please drink at least drink at least 64 ounces of water daily to maintain hydration. Avoid use of aleeve, ibuprofen   Lets recheck your labs in one week  Nurse schedule an appointment for labs in one week .

## 2023-05-03 NOTE — Telephone Encounter (Signed)
Informed patient of results and recommendations. 

## 2023-05-08 ENCOUNTER — Other Ambulatory Visit: Payer: Medicaid Other

## 2023-05-08 DIAGNOSIS — N179 Acute kidney failure, unspecified: Secondary | ICD-10-CM

## 2023-05-09 LAB — BASIC METABOLIC PANEL
BUN/Creatinine Ratio: 18 (ref 9–23)
BUN: 19 mg/dL (ref 6–24)
CO2: 20 mmol/L (ref 20–29)
Calcium: 8.6 mg/dL — ABNORMAL LOW (ref 8.7–10.2)
Chloride: 108 mmol/L — ABNORMAL HIGH (ref 96–106)
Creatinine, Ser: 1.03 mg/dL — ABNORMAL HIGH (ref 0.57–1.00)
Glucose: 87 mg/dL (ref 70–99)
Potassium: 4 mmol/L (ref 3.5–5.2)
Sodium: 141 mmol/L (ref 134–144)
eGFR: 67 mL/min/{1.73_m2} (ref >59)

## 2023-05-11 ENCOUNTER — Other Ambulatory Visit: Payer: Self-pay | Admitting: Family Medicine

## 2023-05-11 ENCOUNTER — Other Ambulatory Visit: Payer: Self-pay

## 2023-05-11 DIAGNOSIS — Z9109 Other allergy status, other than to drugs and biological substances: Secondary | ICD-10-CM

## 2023-05-11 MED ORDER — LORATADINE 10 MG PO TABS
10.0000 mg | ORAL_TABLET | Freq: Every day | ORAL | 11 refills | Status: DC
Start: 2023-05-11 — End: 2023-11-02
  Filled 2023-05-11: qty 30, 30d supply, fill #0
  Filled 2023-06-13: qty 30, 30d supply, fill #1
  Filled 2023-07-14: qty 30, 30d supply, fill #2
  Filled 2023-08-14: qty 30, 30d supply, fill #3
  Filled 2023-09-12: qty 30, 30d supply, fill #4
  Filled 2023-10-12: qty 30, 30d supply, fill #5

## 2023-05-16 ENCOUNTER — Other Ambulatory Visit: Payer: Self-pay

## 2023-05-19 ENCOUNTER — Other Ambulatory Visit: Payer: Self-pay

## 2023-06-07 ENCOUNTER — Ambulatory Visit
Admission: RE | Admit: 2023-06-07 | Discharge: 2023-06-07 | Disposition: A | Discharge status: Home / Self Care | Payer: Medicaid Other | Source: Ambulatory Visit | Attending: Nurse Practitioner

## 2023-06-07 DIAGNOSIS — Z1231 Encounter for screening mammogram for malignant neoplasm of breast: Secondary | ICD-10-CM

## 2023-06-13 ENCOUNTER — Encounter: Payer: Self-pay | Admitting: Nurse Practitioner

## 2023-06-13 ENCOUNTER — Ambulatory Visit (INDEPENDENT_AMBULATORY_CARE_PROVIDER_SITE_OTHER): Payer: Medicaid Other | Admitting: Nurse Practitioner

## 2023-06-13 VITALS — BP 113/86 | HR 92 | Temp 97.6°F | Resp 12 | Ht 60.0 in | Wt 109.0 lb

## 2023-06-13 DIAGNOSIS — F419 Anxiety disorder, unspecified: Secondary | ICD-10-CM

## 2023-06-13 NOTE — Progress Notes (Signed)
Subjective   [name], female    DOB: [birth year], 47 y.o.   MRN: [id number]  Chief Complaint  Patient presents with   Mental Health Problem    Referring provider: Massie Maroon, FNP  [name] is a 47 y.o. female with Past Medical History: No date: Hypertension No date: Left shoulder pain 12/31/2015: Tobacco dependence   HPI  Patient presents today for an acute visit.  She states that recently she has been having a lot of anxiety related to her job.  She does have at the same time which does include mental health days available to her.  She would like a work note today to be out for 1 week for mental health.  She is going to try to find a different job as well.  Denies f/c/s, n/v/d, hemoptysis, PND, leg swelling Denies chest pain or edema       No Known Allergies  Immunization History  Administered Date(s) Administered   Influenza, Seasonal, Injecte, Preservative Fre 05/02/2023   Influenza,inj,Quad PF,6+ Mos 03/15/2018, 06/16/2020, 04/27/2021   Tdap 08/04/2016    Tobacco History: Social History   Tobacco Use  Smoking Status Every Day   Current packs/day: 1.00   Average packs/day: 1 pack/day for 15.0 years (15.0 ttl pk-yrs)   Types: Cigarettes  Smokeless Tobacco Never  Tobacco Comments   down to a half pack   Ready to quit: Not Answered Counseling given: Not Answered Tobacco comments: down to a half pack   Outpatient Encounter Medications as of 06/13/2023  Medication Sig   acetaminophen (TYLENOL) 500 MG tablet Take 2 tablets (1,000 mg total) by mouth every 6 (six) hours as needed.   amLODipine (NORVASC) 5 MG tablet Take 1 tablet (5 mg total) by mouth daily.   lidocaine (LIDODERM) 5 % Place 1 patch onto the skin daily. Remove & Discard patch within 12 hours or as directed by MD   loratadine (CLARITIN) 10 MG tablet Take 1 tablet (10 mg total) by mouth daily.   methocarbamol (ROBAXIN) 500 MG tablet Take 1 tablet (500 mg total)  by mouth every 6 (six) hours as needed for muscle spasms. (Patient not taking: Reported on 05/02/2023)   No facility-administered encounter medications on file as of 06/13/2023.    Review of Systems  Review of Systems  Constitutional: Negative.   HENT: Negative.    Cardiovascular: Negative.   Gastrointestinal: Negative.   Allergic/Immunologic: Negative.   Neurological: Negative.   Psychiatric/Behavioral:  The patient is nervous/anxious.      Objective:   BP 113/86 (BP Location: Left Arm, Patient Position: Sitting, Cuff Size: Normal)   Pulse 92   Temp 97.6 F (36.4 C)   Resp 12   Ht 5' (1.524 m)   Wt 109 lb (49.4 kg)   SpO2 100%   BMI 21.29 kg/m   Wt Readings from Last 5 Encounters:  06/13/23 109 lb (49.4 kg)  05/02/23 108 lb (49 kg)  11/01/22 110 lb 6.4 oz (50.1 kg)  05/03/22 108 lb 6.4 oz (49.2 kg)  10/26/21 108 lb (49 kg)     Physical Exam Vitals and nursing note reviewed.  Constitutional:      General: She is not in acute distress.    Appearance: She is well-developed.  Cardiovascular:     Rate and Rhythm: Normal rate and regular rhythm.  Pulmonary:     Effort: Pulmonary effort is normal.     Breath sounds: Normal breath sounds.  Neurological:  Mental Status: She is alert and oriented to person, place, and time.       Assessment & Plan:   Anxiety    Patient Instructions  1. Anxiety  Will write work note for the next week for mental health days    Follow up:  Follow up as needs    Return if symptoms worsen or fail to improve.   Suzanne Andrew, NP 06/13/2023

## 2023-06-13 NOTE — Patient Instructions (Addendum)
1. Anxiety  Will write work note for the next week for mental health days    Follow up:  Follow up as needs

## 2023-06-15 ENCOUNTER — Other Ambulatory Visit: Payer: Self-pay

## 2023-07-17 ENCOUNTER — Other Ambulatory Visit: Payer: Self-pay

## 2023-08-15 ENCOUNTER — Other Ambulatory Visit: Payer: Self-pay

## 2023-09-12 ENCOUNTER — Other Ambulatory Visit: Payer: Self-pay

## 2023-10-13 ENCOUNTER — Other Ambulatory Visit: Payer: Self-pay

## 2023-10-31 ENCOUNTER — Ambulatory Visit: Payer: Self-pay | Admitting: Family Medicine

## 2023-11-02 ENCOUNTER — Encounter: Payer: Self-pay | Admitting: Nurse Practitioner

## 2023-11-02 ENCOUNTER — Ambulatory Visit (INDEPENDENT_AMBULATORY_CARE_PROVIDER_SITE_OTHER): Payer: Self-pay | Admitting: Nurse Practitioner

## 2023-11-02 ENCOUNTER — Other Ambulatory Visit: Payer: Self-pay

## 2023-11-02 VITALS — BP 126/84 | HR 77 | Temp 98.6°F | Wt 111.0 lb

## 2023-11-02 DIAGNOSIS — Z13220 Encounter for screening for lipoid disorders: Secondary | ICD-10-CM

## 2023-11-02 DIAGNOSIS — Z9109 Other allergy status, other than to drugs and biological substances: Secondary | ICD-10-CM

## 2023-11-02 DIAGNOSIS — Z1159 Encounter for screening for other viral diseases: Secondary | ICD-10-CM

## 2023-11-02 DIAGNOSIS — Z1211 Encounter for screening for malignant neoplasm of colon: Secondary | ICD-10-CM | POA: Diagnosis not present

## 2023-11-02 DIAGNOSIS — I10 Essential (primary) hypertension: Secondary | ICD-10-CM | POA: Diagnosis not present

## 2023-11-02 MED ORDER — AMLODIPINE BESYLATE 5 MG PO TABS
5.0000 mg | ORAL_TABLET | Freq: Every day | ORAL | 2 refills | Status: DC
Start: 2023-11-02 — End: 2024-02-02
  Filled 2023-11-02: qty 90, 90d supply, fill #0

## 2023-11-02 MED ORDER — LORATADINE 10 MG PO TABS
10.0000 mg | ORAL_TABLET | Freq: Every day | ORAL | 11 refills | Status: AC
Start: 2023-11-02 — End: ?
  Filled 2023-11-02 – 2023-11-10 (×2): qty 30, 30d supply, fill #0
  Filled 2024-06-07: qty 30, 30d supply, fill #1

## 2023-11-02 NOTE — Patient Instructions (Addendum)
 1. Essential hypertension  - amLODipine (NORVASC) 5 MG tablet; Take 1 tablet (5 mg total) by mouth daily.  Dispense: 90 tablet; Refill: 2 - CBC - Comprehensive metabolic panel with GFR   2. Environmental allergies  - loratadine (CLARITIN) 10 MG tablet; Take 1 tablet (10 mg total) by mouth daily.  Dispense: 30 tablet; Refill: 11   3. Colon cancer screening (Primary)  - Ambulatory referral to Gastroenterology   4. Lipid screening  - Lipid Panel   5. Need for hepatitis C screening test  - Hepatitis C antibody

## 2023-11-02 NOTE — Progress Notes (Signed)
 Subjective   [name], female    DOB: [birth year], 48 y.o.   MRN: [id number]  Chief Complaint  Patient presents with   Medical Management of Chronic Issues    Referring provider: Sigurd Driver, FNP  [name] is a 48 y.o. female with Past Medical History: No date: Hypertension No date: Left shoulder pain 12/31/2015: Tobacco dependence   HPI  Patient presents today for follow-up visit.  She does need a refill on amlodipine  for hypertension.  She does need lipid screening today.  She does need referral to GI for colon cancer screening.  Patient does have a history of allergies and needs a refill on Claritin .  She is due for hepatitis C screening today. Denies f/c/s, n/v/d, hemoptysis, PND, leg swelling Denies chest pain or edema      No Known Allergies  Immunization History  Administered Date(s) Administered   Influenza, Seasonal, Injecte, Preservative Fre 05/02/2023   Influenza,inj,Quad PF,6+ Mos 03/15/2018, 06/16/2020, 04/27/2021   Tdap 08/04/2016    Tobacco History: Social History   Tobacco Use  Smoking Status Every Day   Current packs/day: 1.00   Average packs/day: 1 pack/day for 15.0 years (15.0 ttl pk-yrs)   Types: Cigarettes  Smokeless Tobacco Never  Tobacco Comments   down to a half pack   Ready to quit: No Counseling given: Yes Tobacco comments: down to a half pack   Outpatient Encounter Medications as of 11/02/2023  Medication Sig   acetaminophen  (TYLENOL ) 500 MG tablet Take 2 tablets (1,000 mg total) by mouth every 6 (six) hours as needed.   lidocaine  (LIDODERM ) 5 % Place 1 patch onto the skin daily. Remove & Discard patch within 12 hours or as directed by MD   methocarbamol  (ROBAXIN ) 500 MG tablet Take 1 tablet (500 mg total) by mouth every 6 (six) hours as needed for muscle spasms.   [DISCONTINUED] amLODipine  (NORVASC ) 5 MG tablet Take 1 tablet (5 mg total) by mouth daily.   [DISCONTINUED] loratadine  (CLARITIN ) 10 MG  tablet Take 1 tablet (10 mg total) by mouth daily.   amLODipine  (NORVASC ) 5 MG tablet Take 1 tablet (5 mg total) by mouth daily.   loratadine  (CLARITIN ) 10 MG tablet Take 1 tablet (10 mg total) by mouth daily.   No facility-administered encounter medications on file as of 11/02/2023.    Review of Systems  Review of Systems  Constitutional: Negative.   HENT: Negative.    Cardiovascular: Negative.   Gastrointestinal: Negative.   Allergic/Immunologic: Negative.   Neurological: Negative.   Psychiatric/Behavioral: Negative.       Objective:   BP 126/84   Pulse 77   Temp 98.6 F (37 C) (Oral)   Wt 111 lb (50.3 kg)   SpO2 100%   BMI 21.68 kg/m   Wt Readings from Last 5 Encounters:  11/02/23 111 lb (50.3 kg)  06/13/23 109 lb (49.4 kg)  05/02/23 108 lb (49 kg)  11/01/22 110 lb 6.4 oz (50.1 kg)  05/03/22 108 lb 6.4 oz (49.2 kg)     Physical Exam Vitals and nursing note reviewed.  Constitutional:      General: She is not in acute distress.    Appearance: She is well-developed.  Cardiovascular:     Rate and Rhythm: Normal rate and regular rhythm.  Pulmonary:     Effort: Pulmonary effort is normal.     Breath sounds: Normal breath sounds.  Neurological:     Mental Status: She is alert and oriented  to person, place, and time.       Assessment & Plan:   Colon cancer screening -     Ambulatory referral to Gastroenterology  Essential hypertension -     amLODIPine  Besylate; Take 1 tablet (5 mg total) by mouth daily.  Dispense: 90 tablet; Refill: 2 -     CBC -     Comprehensive metabolic panel with GFR  Environmental allergies -     Loratadine ; Take 1 tablet (10 mg total) by mouth daily.  Dispense: 30 tablet; Refill: 11  Lipid screening -     Lipid panel     Return in about 3 months (around 02/01/2024).   Jerrlyn Morel, NP 11/02/2023

## 2023-11-03 LAB — COMPREHENSIVE METABOLIC PANEL WITH GFR
ALT: 28 IU/L (ref 0–32)
AST: 19 IU/L (ref 0–40)
Albumin: 4.3 g/dL (ref 3.9–4.9)
Alkaline Phosphatase: 55 IU/L (ref 44–121)
BUN/Creatinine Ratio: 13 (ref 9–23)
BUN: 14 mg/dL (ref 6–24)
Bilirubin Total: 0.4 mg/dL (ref 0.0–1.2)
CO2: 23 mmol/L (ref 20–29)
Calcium: 9.4 mg/dL (ref 8.7–10.2)
Chloride: 101 mmol/L (ref 96–106)
Creatinine, Ser: 1.12 mg/dL — ABNORMAL HIGH (ref 0.57–1.00)
Globulin, Total: 2.3 g/dL (ref 1.5–4.5)
Glucose: 87 mg/dL (ref 70–99)
Potassium: 4.2 mmol/L (ref 3.5–5.2)
Sodium: 138 mmol/L (ref 134–144)
Total Protein: 6.6 g/dL (ref 6.0–8.5)
eGFR: 61 mL/min/{1.73_m2} (ref >59)

## 2023-11-03 LAB — CBC
Hematocrit: 38.1 % (ref 34.0–46.6)
Hemoglobin: 12.8 g/dL (ref 11.1–15.9)
MCH: 33.9 pg — ABNORMAL HIGH (ref 26.6–33.0)
MCHC: 33.6 g/dL (ref 31.5–35.7)
MCV: 101 fL — ABNORMAL HIGH (ref 79–97)
Platelets: 226 10*3/uL (ref 150–450)
RBC: 3.78 x10E6/uL (ref 3.77–5.28)
RDW: 11.8 % (ref 11.7–15.4)
WBC: 5.5 10*3/uL (ref 3.4–10.8)

## 2023-11-03 LAB — HEPATITIS C ANTIBODY: Hep C Virus Ab: NONREACTIVE

## 2023-11-03 LAB — LIPID PANEL
Chol/HDL Ratio: 2.1 ratio (ref 0.0–4.4)
Cholesterol, Total: 121 mg/dL (ref 100–199)
HDL: 59 mg/dL (ref >39)
LDL Chol Calc (NIH): 49 mg/dL (ref 0–99)
Triglycerides: 61 mg/dL (ref 0–149)
VLDL Cholesterol Cal: 13 mg/dL (ref 5–40)

## 2023-11-10 ENCOUNTER — Other Ambulatory Visit: Payer: Self-pay

## 2023-11-13 ENCOUNTER — Other Ambulatory Visit: Payer: Self-pay

## 2023-11-17 ENCOUNTER — Other Ambulatory Visit: Payer: Self-pay

## 2023-11-17 ENCOUNTER — Ambulatory Visit (HOSPITAL_COMMUNITY)
Admission: EM | Admit: 2023-11-17 | Discharge: 2023-11-17 | Disposition: A | Discharge status: Home / Self Care | Attending: Emergency Medicine | Admitting: Emergency Medicine

## 2023-11-17 ENCOUNTER — Encounter (HOSPITAL_COMMUNITY): Payer: Self-pay

## 2023-11-17 DIAGNOSIS — H1033 Unspecified acute conjunctivitis, bilateral: Secondary | ICD-10-CM | POA: Diagnosis not present

## 2023-11-17 DIAGNOSIS — H00014 Hordeolum externum left upper eyelid: Secondary | ICD-10-CM | POA: Diagnosis not present

## 2023-11-17 MED ORDER — ERYTHROMYCIN 5 MG/GM OP OINT
TOPICAL_OINTMENT | OPHTHALMIC | 0 refills | Status: AC
  Filled 2023-11-17: qty 3.5, 14d supply, fill #0

## 2023-11-17 MED ORDER — CETIRIZINE HCL 10 MG PO TABS
10.0000 mg | ORAL_TABLET | Freq: Every day | ORAL | 0 refills | Status: DC
  Filled 2023-11-17: qty 30, 30d supply, fill #0

## 2023-11-17 MED ORDER — OLOPATADINE HCL 0.1 % OP SOLN
1.0000 [drp] | Freq: Two times a day (BID) | OPHTHALMIC | 0 refills | Status: AC
  Filled 2023-11-17: qty 5, 25d supply, fill #0

## 2023-11-17 NOTE — ED Triage Notes (Signed)
 Patient presenting with left eye swelling and itching going into the right eye onset This past Monday. Patient has history of seasonal allergies. States she had a stye on the left eye as well. States the entire left side of the face hurts. Now having discharge from the left eye in the mornings.  Prescriptions or OTC medications tried: Yes- Claritin      with no relief

## 2023-11-17 NOTE — Discharge Instructions (Signed)
 Use erythromycin  ointment 4 times daily for 5 days to left eye for coverage of possible bacterial conjunctivitis. Use Pataday  eyedrops to both eyes twice daily to help with itching and redness. Take cetirizine  once daily to help with eye itching and allergy related symptoms. Apply warm compresses to your left eye to help with swelling and drainage related to stye. Follow-up with your ophthalmologist if your symptoms persist. Return here as needed.

## 2023-11-17 NOTE — ED Provider Notes (Signed)
 [facility]    CSN: [id number] Arrival date & time: 11/17/23  8295      History   Chief Complaint Chief Complaint  Patient presents with   Eye Pain   Eye Drainage    HPI [name] is a 48 y.o. female.   Patient presents with left eye swelling, itching, redness, and drainage x 2 days.  Patient states that she does have seasonal allergies and is began to have some itching and redness with watery discharge to her right eye as well.    Patient states that the drainage from her left eye has become thick and yellow versus watery and is concerned she may have pinkeye.  Patient reports that she also had a stye on her left eye.  Patient states that she normally wears lashes but took them off because she thought they might be causing worsening irritation.  Patient states that she has not been applying warm compresses to left eye with minimal relief.  Patient states that she has been using over-the-counter eyedrops to both eyes with minimal relief of itching and irritation.  Patient states that she has also been taking Claritin  without relief.  The history is provided by the patient and medical records.  Eye Pain    Past Medical History:  Diagnosis Date   Hypertension    Left shoulder pain    Tobacco dependence 12/31/2015    Patient Active Problem List   Diagnosis Date Noted   Need for influenza vaccination 05/02/2023   Abscess of left axilla 03/12/2019   Radiculopathy of cervicothoracic region 11/22/2016   Essential hypertension 11/21/2016   Injury of left shoulder 08/04/2016   Muscle spasm of left shoulder 12/31/2015   Left shoulder pain 12/31/2015   Tobacco dependence 12/31/2015    Past Surgical History:  Procedure Laterality Date   CESAREAN SECTION     TUBAL LIGATION      OB History   No obstetric history on file.      Home Medications    Prior to Admission medications   Medication Sig Start Date End Date Taking? Authorizing Provider   acetaminophen  (TYLENOL ) 500 MG tablet Take 2 tablets (1,000 mg total) by mouth every 6 (six) hours as needed. 05/03/22  Yes Sigurd Driver, FNP  amLODipine  (NORVASC ) 5 MG tablet Take 1 tablet (5 mg total) by mouth daily. 11/02/23  Yes Jerrlyn Morel, NP  cetirizine  (ZYRTEC  ALLERGY) 10 MG tablet Take 1 tablet (10 mg total) by mouth daily. 11/17/23  Yes Levora Reas A, NP  erythromycin  ophthalmic ointment Place a 1/2 inch ribbon of ointment into the lower eyelid. 11/17/23  Yes Rosevelt Constable, Chriselda Leppert A, NP  lidocaine  (LIDODERM ) 5 % Place 1 patch onto the skin daily. Remove & Discard patch within 12 hours or as directed by MD   Yes [provider]  loratadine  (CLARITIN ) 10 MG tablet Take 1 tablet (10 mg total) by mouth daily. 11/02/23  Yes Jerrlyn Morel, NP  methocarbamol  (ROBAXIN ) 500 MG tablet Take 1 tablet (500 mg total) by mouth every 6 (six) hours as needed for muscle spasms. 05/26/22  Yes Wynetta Heckle, MD  olopatadine  (PATADAY ) 0.1 % ophthalmic solution Place 1 drop into both eyes 2 (two) times daily. 11/17/23  Yes Karon Packer, NP    Family History Family History  Problem Relation Age of Onset   Cancer Father     Social History Social History   Tobacco Use   Smoking status: Every Day  Current packs/day: 1.00    Average packs/day: 1 pack/day for 15.0 years (15.0 ttl pk-yrs)    Types: Cigarettes   Smokeless tobacco: Never   Tobacco comments:    down to a half pack  Vaping Use   Vaping status: Never Used  Substance Use Topics   Alcohol use: No   Drug use: No     Allergies   Patient has no known allergies.   Review of Systems Review of Systems  Eyes:  Positive for pain.   Per HPI  Physical Exam Triage Vital Signs ED Triage Vitals  Encounter Vitals Group     BP 11/17/23 1052 120/79     Systolic BP Percentile --      Diastolic BP Percentile --      Pulse Rate 11/17/23 1052 76     Resp 11/17/23 1052 16     Temp 11/17/23 1052 98.4 F (36.9 C)      Temp Source 11/17/23 1052 Oral     SpO2 11/17/23 1052 98 %     Weight --      Height --      Head Circumference --      Peak Flow --      Pain Score 11/17/23 1050 10     Pain Loc --      Pain Education --      Exclude from Growth Chart --    No data found.  Updated Vital Signs BP 120/79 (BP Location: Right Arm)   Pulse 76   Temp 98.4 F (36.9 C) (Oral)   Resp 16   LMP 11/12/2023 (Exact Date)   SpO2 98%   Visual Acuity Right Eye Distance:   Left Eye Distance:   Bilateral Distance:    Right Eye Near:   Left Eye Near:    Bilateral Near:     Physical Exam Vitals and nursing note reviewed.  Constitutional:      General: She is awake. She is not in acute distress.    Appearance: Normal appearance. She is well-developed and well-groomed. She is not ill-appearing.  Eyes:     General:        Left eye: Discharge and hordeolum present.    Extraocular Movements: Extraocular movements intact.     Conjunctiva/sclera:     Right eye: Right conjunctiva is injected.     Left eye: Left conjunctiva is injected.     Pupils: Pupils are equal, round, and reactive to light.     Comments: Hordeolum noted to left upper eyelid. Yellow crusty discharge noted to inner corner of left eye.  Skin:    General: Skin is warm and dry.  Neurological:     Mental Status: She is alert.  Psychiatric:        Behavior: Behavior is cooperative.      UC Treatments / Results  Labs (all labs ordered are listed, but only abnormal results are displayed) Labs Reviewed - No data to display  EKG   Radiology No results found.  Procedures Procedures (including critical care time)  Medications Ordered in UC Medications - No data to display  Initial Impression / Assessment and Plan / UC Course  I have reviewed the triage vital signs and the nursing notes.  Pertinent labs & imaging results that were available during my care of the patient were reviewed by me and considered in my medical decision  making (see chart for details).     Patient is well-appearing.  Vitals are stable.  Upon assessment there is a hordeolum noted to the left upper eyelid.  There is also some yellow crusty discharge noted to the inner corner of the left eye.  Both conjunctiva are mildly injected.  Prescribed erythromycin  for bacterial conjunctivitis coverage of left eye.  Prescribed Pataday  eyedrops to use twice daily to help with eye itching and redness.  Prescribed cetirizine  to take daily for eye itching and allergy related symptoms instead of Claritin .  Recommended applying warm compresses to left eye to help with stye.  Recommended following up with ophthalmologist if symptoms persist.  Discussed return precautions. Final Clinical Impressions(s) / UC Diagnoses   Final diagnoses:  Acute conjunctivitis of both eyes, unspecified acute conjunctivitis type  Hordeolum externum of left upper eyelid     Discharge Instructions      Use erythromycin  ointment 4 times daily for 5 days to left eye for coverage of possible bacterial conjunctivitis. Use Pataday  eyedrops to both eyes twice daily to help with itching and redness. Take cetirizine  once daily to help with eye itching and allergy related symptoms. Apply warm compresses to your left eye to help with swelling and drainage related to stye. Follow-up with your ophthalmologist if your symptoms persist. Return here as needed.     ED Prescriptions     Medication Sig Dispense Auth. Provider   erythromycin  ophthalmic ointment Place a 1/2 inch ribbon of ointment into the lower eyelid. 3.5 g Levora Reas A, NP   olopatadine  (PATADAY ) 0.1 % ophthalmic solution Place 1 drop into both eyes 2 (two) times daily. 5 mL Levora Reas A, NP   cetirizine  (ZYRTEC  ALLERGY) 10 MG tablet Take 1 tablet (10 mg total) by mouth daily. 30 tablet Levora Reas A, NP      PDMP not reviewed this encounter.   Levora Reas A, NP 11/17/23 1112

## 2023-12-15 ENCOUNTER — Other Ambulatory Visit: Payer: Self-pay

## 2023-12-15 ENCOUNTER — Other Ambulatory Visit: Payer: Self-pay | Admitting: Family Medicine

## 2023-12-15 MED ORDER — CETIRIZINE HCL 10 MG PO TABS
10.0000 mg | ORAL_TABLET | Freq: Every day | ORAL | 0 refills | Status: DC
  Filled 2023-12-15: qty 30, 30d supply, fill #0

## 2023-12-21 ENCOUNTER — Other Ambulatory Visit: Payer: Self-pay

## 2024-01-21 ENCOUNTER — Other Ambulatory Visit: Payer: Self-pay | Admitting: Nurse Practitioner

## 2024-01-22 ENCOUNTER — Other Ambulatory Visit: Payer: Self-pay

## 2024-01-22 MED ORDER — CETIRIZINE HCL 10 MG PO TABS
10.0000 mg | ORAL_TABLET | Freq: Every day | ORAL | 0 refills | Status: DC
  Filled 2024-01-22: qty 30, 30d supply, fill #0

## 2024-01-25 ENCOUNTER — Other Ambulatory Visit: Payer: Self-pay

## 2024-02-02 ENCOUNTER — Ambulatory Visit (INDEPENDENT_AMBULATORY_CARE_PROVIDER_SITE_OTHER): Payer: Self-pay | Admitting: Nurse Practitioner

## 2024-02-02 ENCOUNTER — Other Ambulatory Visit: Payer: Self-pay

## 2024-02-02 ENCOUNTER — Telehealth: Payer: Self-pay | Admitting: Nurse Practitioner

## 2024-02-02 ENCOUNTER — Encounter: Payer: Self-pay | Admitting: Nurse Practitioner

## 2024-02-02 VITALS — BP 106/78 | HR 67 | Temp 97.3°F | Wt 109.0 lb

## 2024-02-02 DIAGNOSIS — Z1211 Encounter for screening for malignant neoplasm of colon: Secondary | ICD-10-CM

## 2024-02-02 DIAGNOSIS — I10 Essential (primary) hypertension: Secondary | ICD-10-CM | POA: Diagnosis not present

## 2024-02-02 DIAGNOSIS — H547 Unspecified visual loss: Secondary | ICD-10-CM

## 2024-02-02 MED ORDER — CETIRIZINE HCL 10 MG PO TABS
10.0000 mg | ORAL_TABLET | Freq: Every day | ORAL | 2 refills | Status: DC
  Filled 2024-02-02 – 2024-02-22 (×2): qty 30, 30d supply, fill #0
  Filled 2024-04-03: qty 30, 30d supply, fill #1
  Filled 2024-05-07: qty 30, 30d supply, fill #2

## 2024-02-02 MED ORDER — AMLODIPINE BESYLATE 5 MG PO TABS
5.0000 mg | ORAL_TABLET | Freq: Every day | ORAL | 2 refills | Status: AC
  Filled 2024-02-02: qty 90, 90d supply, fill #0
  Filled 2024-05-07: qty 90, 90d supply, fill #1
  Filled 2024-07-31: qty 90, 90d supply, fill #2

## 2024-02-02 NOTE — Progress Notes (Signed)
   Subjective   [name], female    DOB: 19-Jan-1976, 48 y.o.   MRN: [id number]  Chief Complaint  Patient presents with   Establish Care   Hypertension    Referring provider: Tilford Bertram HERO, FNP  [name] is a 48 y.o. female with Past Medical History: No date: Hypertension No date: Left shoulder pain 12/31/2015: Tobacco dependence  HPI: HPI  No Known Allergies  Immunization History  Administered Date(s) Administered   Influenza, Seasonal, Injecte, Preservative Fre 05/02/2023   Influenza,inj,Quad PF,6+ Mos 03/15/2018, 06/16/2020, 04/27/2021   Tdap 08/04/2016    Tobacco History: Social History   Tobacco Use  Smoking Status Every Day   Current packs/day: 1.00   Average packs/day: 1 pack/day for 15.0 years (15.0 ttl pk-yrs)   Types: Cigarettes  Smokeless Tobacco Never  Tobacco Comments   down to a half pack   Ready to quit: Not Answered Counseling given: Not Answered Tobacco comments: down to a half pack   Outpatient Encounter Medications as of 02/02/2024  Medication Sig   acetaminophen  (TYLENOL ) 500 MG tablet Take 2 tablets (1,000 mg total) by mouth every 6 (six) hours as needed.   amLODipine  (NORVASC ) 5 MG tablet Take 1 tablet (5 mg total) by mouth daily.   cetirizine  (ZYRTEC  ALLERGY) 10 MG tablet Take 1 tablet (10 mg total) by mouth daily.   loratadine  (CLARITIN ) 10 MG tablet Take 1 tablet (10 mg total) by mouth daily.   erythromycin  ophthalmic ointment Place a 1/2 inch ribbon of ointment into the lower eyelid. (Patient not taking: Reported on 02/02/2024)   lidocaine  (LIDODERM ) 5 % Place 1 patch onto the skin daily. Remove & Discard patch within 12 hours or as directed by MD   methocarbamol  (ROBAXIN ) 500 MG tablet Take 1 tablet (500 mg total) by mouth every 6 (six) hours as needed for muscle spasms. (Patient not taking: Reported on 02/02/2024)   olopatadine  (PATADAY ) 0.1 % ophthalmic solution Place 1 drop into both eyes 2 (two) times  daily. (Patient not taking: Reported on 02/02/2024)   No facility-administered encounter medications on file as of 02/02/2024.    Review of Systems  Review of Systems   Objective:   Wt 109 lb (49.4 kg)   BMI 21.29 kg/m   Wt Readings from Last 5 Encounters:  02/02/24 109 lb (49.4 kg)  11/02/23 111 lb (50.3 kg)  06/13/23 109 lb (49.4 kg)  05/02/23 108 lb (49 kg)  11/01/22 110 lb 6.4 oz (50.1 kg)     Physical Exam    Assessment & Plan:   There are no diagnoses linked to this encounter.   No follow-ups on file.   Suzen Shove, RMA 02/02/2024

## 2024-02-02 NOTE — Progress Notes (Signed)
 Subjective   [name], female    DOB: 09-29-1975, 48 y.o.   MRN: [id number]  Chief Complaint  Patient presents with   Establish Care   Hypertension    Referring provider: Tilford Bertram HERO, FNP  Revonda LOISE Herring is a 48 y.o. female with Past Medical History: No date: Hypertension No date: Left shoulder pain 12/31/2015: Tobacco dependence   HPI  Patient presents today for follow-up visit.  She does need a refill on amlodipine  for hypertension.   Denies f/c/s, n/v/d, hemoptysis, PND, leg swelling. Denies chest pain or edema.  Note: Patient is requesting a referral to an eye doctor.  She is having issues with decreased vision related to reading.    No Known Allergies  Immunization History  Administered Date(s) Administered   Influenza, Seasonal, Injecte, Preservative Fre 05/02/2023   Influenza,inj,Quad PF,6+ Mos 03/15/2018, 06/16/2020, 04/27/2021   Tdap 08/04/2016    Tobacco History: Social History   Tobacco Use  Smoking Status Every Day   Current packs/day: 1.00   Average packs/day: 1 pack/day for 15.0 years (15.0 ttl pk-yrs)   Types: Cigarettes  Smokeless Tobacco Never  Tobacco Comments   down to a half pack   Ready to quit: Not Answered Counseling given: Not Answered Tobacco comments: down to a half pack   Outpatient Encounter Medications as of 02/02/2024  Medication Sig   acetaminophen  (TYLENOL ) 500 MG tablet Take 2 tablets (1,000 mg total) by mouth every 6 (six) hours as needed.   loratadine  (CLARITIN ) 10 MG tablet Take 1 tablet (10 mg total) by mouth daily.   [DISCONTINUED] amLODipine  (NORVASC ) 5 MG tablet Take 1 tablet (5 mg total) by mouth daily.   [DISCONTINUED] cetirizine  (ZYRTEC  ALLERGY) 10 MG tablet Take 1 tablet (10 mg total) by mouth daily.   amLODipine  (NORVASC ) 5 MG tablet Take 1 tablet (5 mg total) by mouth daily.   cetirizine  (ZYRTEC  ALLERGY) 10 MG tablet Take 1 tablet (10 mg total) by mouth daily.   erythromycin   ophthalmic ointment Place a 1/2 inch ribbon of ointment into the lower eyelid. (Patient not taking: Reported on 02/02/2024)   lidocaine  (LIDODERM ) 5 % Place 1 patch onto the skin daily. Remove & Discard patch within 12 hours or as directed by MD   methocarbamol  (ROBAXIN ) 500 MG tablet Take 1 tablet (500 mg total) by mouth every 6 (six) hours as needed for muscle spasms. (Patient not taking: Reported on 02/02/2024)   olopatadine  (PATADAY ) 0.1 % ophthalmic solution Place 1 drop into both eyes 2 (two) times daily. (Patient not taking: Reported on 02/02/2024)   No facility-administered encounter medications on file as of 02/02/2024.    Review of Systems  Review of Systems  Constitutional: Negative.   HENT: Negative.    Cardiovascular: Negative.   Gastrointestinal: Negative.   Allergic/Immunologic: Negative.   Neurological: Negative.   Psychiatric/Behavioral: Negative.       Objective:   BP 106/78   Pulse 67   Temp (!) 97.3 F (36.3 C)   Wt 109 lb (49.4 kg)   SpO2 100%   BMI 21.29 kg/m   Wt Readings from Last 5 Encounters:  02/02/24 109 lb (49.4 kg)  11/02/23 111 lb (50.3 kg)  06/13/23 109 lb (49.4 kg)  05/02/23 108 lb (49 kg)  11/01/22 110 lb 6.4 oz (50.1 kg)     Physical Exam Vitals and nursing note reviewed.  Constitutional:      General: She is not in acute distress.  Appearance: She is well-developed.  Cardiovascular:     Rate and Rhythm: Normal rate and regular rhythm.  Pulmonary:     Effort: Pulmonary effort is normal.     Breath sounds: Normal breath sounds.  Neurological:     Mental Status: She is alert and oriented to person, place, and time.       Assessment & Plan:   Colon cancer screening -     Cologuard  Decreased vision -     Ambulatory referral to Ophthalmology  Essential hypertension -     amLODIPine  Besylate; Take 1 tablet (5 mg total) by mouth daily.  Dispense: 90 tablet; Refill: 2  Other orders -     Cetirizine  HCl; Take 1 tablet (10  mg total) by mouth daily.  Dispense: 30 tablet; Refill: 2     Return in about 6 months (around 08/04/2024).   Bascom GORMAN Borer, NP 02/02/2024

## 2024-02-02 NOTE — Patient Instructions (Signed)
 [facility] (accepts Medicaid, Medicare, [employer], and/or Self-Pay) [facility]  [address], [address],  [place], [place] [address] [phone]  [facility]  [address] [place], [place] [address] [phone]  [facility]  [facility] [place] [address] [place], [place] [address] *Located next to [facility]  [phone]  [facility]  [address] [place], [place] [address]  *Located next to [employer] [phone]  [facility]  [address] [place], [place] [address] [phone]  [facility]  [address] [place], [place] [address]  [phone] *Located inside [facility]  [address] [place], [place] [address] [phone]  [facility] [address] [place], [place] [address] [phone] [address] [place], [place] [address] [phone]  [facility] [address] [place], [place] [address] [phone]  [facility]  [address] [place], [place] [address] [phone]  [facility]  [address] [place], [place] [address] [phone]

## 2024-02-12 ENCOUNTER — Other Ambulatory Visit: Payer: Self-pay

## 2024-02-23 ENCOUNTER — Other Ambulatory Visit: Payer: Self-pay

## 2024-03-01 ENCOUNTER — Other Ambulatory Visit: Payer: Self-pay

## 2024-03-04 ENCOUNTER — Other Ambulatory Visit: Payer: Self-pay

## 2024-03-17 ENCOUNTER — Encounter (HOSPITAL_COMMUNITY): Payer: Self-pay

## 2024-03-17 ENCOUNTER — Ambulatory Visit (HOSPITAL_COMMUNITY)
Admission: EM | Admit: 2024-03-17 | Discharge: 2024-03-17 | Disposition: A | Discharge status: Home / Self Care | Attending: Internal Medicine | Admitting: Internal Medicine

## 2024-03-17 DIAGNOSIS — L729 Follicular cyst of the skin and subcutaneous tissue, unspecified: Secondary | ICD-10-CM | POA: Diagnosis not present

## 2024-03-17 DIAGNOSIS — Q181 Preauricular sinus and cyst: Secondary | ICD-10-CM | POA: Diagnosis not present

## 2024-03-17 DIAGNOSIS — L02412 Cutaneous abscess of left axilla: Secondary | ICD-10-CM

## 2024-03-17 DIAGNOSIS — L089 Local infection of the skin and subcutaneous tissue, unspecified: Secondary | ICD-10-CM

## 2024-03-17 MED ORDER — MUPIROCIN 2 % EX OINT: 1.0000 | TOPICAL_OINTMENT | Freq: Two times a day (BID) | CUTANEOUS | 1 refills | Status: AC

## 2024-03-17 MED ORDER — FLUCONAZOLE 150 MG PO TABS: 150.0000 mg | ORAL_TABLET | Freq: Every day | ORAL | 0 refills | Status: AC

## 2024-03-17 MED ORDER — DOXYCYCLINE HYCLATE 100 MG PO CAPS: 100.0000 mg | ORAL_CAPSULE | Freq: Two times a day (BID) | ORAL | 0 refills | Status: AC

## 2024-03-17 MED ORDER — LIDOCAINE-EPINEPHRINE 1 %-1:100000 IJ SOLN
INTRAMUSCULAR | Status: AC
  Filled 2024-03-17: qty 1

## 2024-03-17 NOTE — Discharge Instructions (Addendum)
 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  Large amount of infected cyst material was expressed.  There is a good chance that this will recur and will likely need surgical excision by plastics surgeon or dermatologist.  We will treat with antibiotics by mouth and topical.  Recommend the following: Doxycycline  100 mg twice daily for 7 days. Take this with food.  Mupirocin  ointment twice daily to the area and cover with a dry dressing. Okay to wash the area with soap and water but do not submerge the head completely underwater until the area completely heals.  Diflucan  150 mg take 1 tablet at first signs of a yeast infection and then repeat in 3 days.  Return to urgent care if there is concerns about the incision including increased pain, increased swelling, redness or recurrence

## 2024-03-17 NOTE — ED Triage Notes (Signed)
 Patient reports that she has an abscess to the base of the right earlobe.   Patient states she has been using warm compresses to the area.

## 2024-03-17 NOTE — ED Provider Notes (Signed)
 [facility]    CSN: [id number] Arrival date & time: 03/17/24  1600      History   Chief Complaint Chief Complaint  Patient presents with   Abscess    HPI [name] is a 48 y.o. female.   48 year old female presents urgent care with complaints of a painful swollen area in the front of her right ear.  She reports this started several days ago but has gotten worse.  She reports the pain is severe and since pain into her ear into her head, and into her teeth.  It even has pain radiating down into her jaw.  She reports that prior to this area becoming painful and swollen there was a small little bump there but it had never caused her problems.  She reports that she had been putting some warm compresses on it and a little bit of drainage came out but not much.  She denies fevers or chills.   Abscess Associated symptoms: no fever and no vomiting     Past Medical History:  Diagnosis Date   Hypertension    Left shoulder pain    Tobacco dependence 12/31/2015    Patient Active Problem List   Diagnosis Date Noted   Need for influenza vaccination 05/02/2023   Abscess of left axilla 03/12/2019   Radiculopathy of cervicothoracic region 11/22/2016   Essential hypertension 11/21/2016   Injury of left shoulder 08/04/2016   Muscle spasm of left shoulder 12/31/2015   Left shoulder pain 12/31/2015   Tobacco dependence 12/31/2015    Past Surgical History:  Procedure Laterality Date   CESAREAN SECTION     TUBAL LIGATION      OB History   No obstetric history on file.      Home Medications    Prior to Admission medications   Medication Sig Start Date End Date Taking? Authorizing Provider  doxycycline  (VIBRAMYCIN ) 100 MG capsule Take 1 capsule (100 mg total) by mouth 2 (two) times daily for 7 days. 03/17/24 03/24/24 Yes Harlowe Dowler A, PA-C  fluconazole  (DIFLUCAN ) 150 MG tablet Take 1 tablet (150 mg total) by mouth daily for 2 days. take 1 tablet at first  onset of a vaginal yeast infection and then repeat in 3 days 03/17/24 03/19/24 Yes Teresa Norris A, PA-C  mupirocin  ointment (BACTROBAN ) 2 % Apply 1 Application topically 2 (two) times daily. 03/17/24  Yes Romyn Boswell A, PA-C  acetaminophen  (TYLENOL ) 500 MG tablet Take 2 tablets (1,000 mg total) by mouth every 6 (six) hours as needed. 05/03/22   Tilford Bertram HERO, FNP  amLODipine  (NORVASC ) 5 MG tablet Take 1 tablet (5 mg total) by mouth daily. 02/02/24   Oley Bascom RAMAN, NP  cetirizine  (ZYRTEC  ALLERGY) 10 MG tablet Take 1 tablet (10 mg total) by mouth daily. 02/02/24   Oley Bascom RAMAN, NP  erythromycin  ophthalmic ointment Place a 1/2 inch ribbon of ointment into the lower eyelid. Patient not taking: Reported on 02/02/2024 11/17/23   Johnie Flaming A, NP  lidocaine  (LIDODERM ) 5 % Place 1 patch onto the skin daily. Remove & Discard patch within 12 hours or as directed by MD    [provider]  loratadine  (CLARITIN ) 10 MG tablet Take 1 tablet (10 mg total) by mouth daily. 11/02/23   Oley Bascom RAMAN, NP  methocarbamol  (ROBAXIN ) 500 MG tablet Take 1 tablet (500 mg total) by mouth every 6 (six) hours as needed for muscle spasms. Patient not taking: Reported on 02/02/2024 05/26/22   Pfeiffer,  Ludivina, MD  olopatadine  (PATADAY ) 0.1 % ophthalmic solution Place 1 drop into both eyes 2 (two) times daily. Patient not taking: Reported on 02/02/2024 11/17/23   Johnie Rumaldo LABOR, NP    Family History Family History  Problem Relation Age of Onset   Cancer Father     Social History Social History   Tobacco Use   Smoking status: Every Day    Current packs/day: 1.00    Average packs/day: 1 pack/day for 15.0 years (15.0 ttl pk-yrs)    Types: Cigarettes   Smokeless tobacco: Never   Tobacco comments:    down to a half pack  Vaping Use   Vaping status: Never Used  Substance Use Topics   Alcohol use: No   Drug use: No     Allergies   Patient has no known allergies.   Review of  Systems Review of Systems  Constitutional:  Negative for chills and fever.  HENT:  Positive for facial swelling. Negative for ear pain and sore throat.   Eyes:  Negative for pain and visual disturbance.  Respiratory:  Negative for cough and shortness of breath.   Cardiovascular:  Negative for chest pain and palpitations.  Gastrointestinal:  Negative for abdominal pain and vomiting.  Genitourinary:  Negative for dysuria and hematuria.  Musculoskeletal:  Negative for arthralgias and back pain.  Skin:  Negative for color change and rash.  Neurological:  Negative for seizures and syncope.  All other systems reviewed and are negative.    Physical Exam Triage Vital Signs ED Triage Vitals  Encounter Vitals Group     BP 03/17/24 1652 117/80     Girls Systolic BP Percentile --      Girls Diastolic BP Percentile --      Boys Systolic BP Percentile --      Boys Diastolic BP Percentile --      Pulse Rate 03/17/24 1651 68     Resp 03/17/24 1651 16     Temp 03/17/24 1651 98.6 F (37 C)     Temp Source 03/17/24 1651 Oral     SpO2 03/17/24 1651 100 %     Weight --      Height --      Head Circumference --      Peak Flow --      Pain Score 03/17/24 1651 10     Pain Loc --      Pain Education --      Exclude from Growth Chart --    No data found.  Updated Vital Signs BP 117/80 (BP Location: Right Arm)   Pulse 68   Temp 98.6 F (37 C) (Oral)   Resp 16   LMP 02/05/2024 (Approximate)   SpO2 100%   Visual Acuity Right Eye Distance:   Left Eye Distance:   Bilateral Distance:    Right Eye Near:   Left Eye Near:    Bilateral Near:     Physical Exam Vitals and nursing note reviewed.  Constitutional:      General: She is not in acute distress.    Appearance: She is well-developed.  HENT:     Head: Normocephalic and atraumatic.   Eyes:     Conjunctiva/sclera: Conjunctivae normal.  Cardiovascular:     Rate and Rhythm: Normal rate and regular rhythm.  Pulmonary:     Effort:  Pulmonary effort is normal. No respiratory distress.  Musculoskeletal:        General: No swelling.     Cervical back:  Neck supple.  Skin:    General: Skin is warm and dry.     Capillary Refill: Capillary refill takes less than 2 seconds.  Neurological:     Mental Status: She is alert.  Psychiatric:        Mood and Affect: Mood normal.      UC Treatments / Results  Labs (all labs ordered are listed, but only abnormal results are displayed) Labs Reviewed - No data to display  EKG   Radiology No results found.  Procedures Incision and Drainage  Date/Time: 03/17/2024 6:34 PM  Performed by: Teresa Almarie LABOR, PA-C Authorized by: Teresa Almarie LABOR, PA-C   Consent:    Consent obtained:  Verbal   Consent given by:  Patient   Risks discussed:  Bleeding, incomplete drainage, pain and damage to other organs   Alternatives discussed:  No treatment Universal protocol:    Procedure explained and questions answered to patient or proxy's satisfaction: yes     Relevant documents present and verified: yes     Site/side marked: yes     Immediately prior to procedure, a time out was called: yes     Patient identity confirmed:  Verbally with patient Location:    Type:  Cyst (Infected epidermal inclusion cyst)   Size:  1.5 cm   Location:  Head   Head location:  Face (Right preauricular) Pre-procedure details:    Skin preparation:  Betadine Anesthesia:    Anesthesia method:  Local infiltration   Local anesthetic:  Lidocaine  1% WITH epi Procedure type:    Complexity:  Simple Procedure details:    Incision types:  Single straight   Incision depth:  Subcutaneous   Wound management:  Probed and deloculated, irrigated with saline and extensive cleaning   Drainage:  Purulent   Drainage amount:  Copious   Wound treatment:  Wound left open   Packing materials:  None Post-procedure details:    Procedure completion:  Tolerated well, no immediate complications  (including critical  care time)  Medications Ordered in UC Medications - No data to display  Initial Impression / Assessment and Plan / UC Course  I have reviewed the triage vital signs and the nursing notes.  Pertinent labs & imaging results that were available during my care of the patient were reviewed by me and considered in my medical decision making (see chart for details).     Preauricular cyst  Infected cyst of skin   Incision and drainage of right preauricular infected epidermal inclusion cyst.  Large amount of infected cyst material was expressed.  There is a good chance that this will recur and will likely need surgical excision by plastics surgeon or dermatologist.  We will treat with antibiotics by mouth and topical.  Recommend the following: Doxycycline  100 mg twice daily for 7 days. Take this with food.  Mupirocin  ointment twice daily to the area and cover with a dry dressing. Okay to wash the area with soap and water but do not submerge the head completely underwater until the area completely heals.  Diflucan  150 mg take 1 tablet at first signs of a yeast infection and then repeat in 3 days.  Return to urgent care if there is concerns about the incision including increased pain, increased swelling, redness or recurrence  Final Clinical Impressions(s) / UC Diagnoses   Final diagnoses:  Preauricular cyst  Infected cyst of skin     Discharge Instructions      Incision and drainage of right preauricular  infected epidermal inclusion cyst.  Large amount of infected cyst material was expressed.  There is a good chance that this will recur and will likely need surgical excision by plastics surgeon or dermatologist.  We will treat with antibiotics by mouth and topical.  Recommend the following: Doxycycline  100 mg twice daily for 7 days. Take this with food.  Mupirocin  ointment twice daily to the area and cover with a dry dressing. Okay to wash the area with soap and water but do not submerge  the head completely underwater until the area completely heals.  Diflucan  150 mg take 1 tablet at first signs of a yeast infection and then repeat in 3 days.  Return to urgent care if there is concerns about the incision including increased pain, increased swelling, redness or recurrence    ED Prescriptions     Medication Sig Dispense Auth. Provider   doxycycline  (VIBRAMYCIN ) 100 MG capsule Take 1 capsule (100 mg total) by mouth 2 (two) times daily for 7 days. 14 capsule Hulbert Branscome A, PA-C   fluconazole  (DIFLUCAN ) 150 MG tablet Take 1 tablet (150 mg total) by mouth daily for 2 days. take 1 tablet at first onset of a vaginal yeast infection and then repeat in 3 days 2 tablet Bryden Darden A, PA-C   mupirocin  ointment (BACTROBAN ) 2 % Apply 1 Application topically 2 (two) times daily. 22 g Teresa Almarie LABOR, [place]      PDMP not reviewed this encounter.   Teresa Almarie LABOR, [place] 03/17/24 [phone]

## 2024-04-08 ENCOUNTER — Other Ambulatory Visit: Payer: Self-pay

## 2024-04-17 ENCOUNTER — Ambulatory Visit (INDEPENDENT_AMBULATORY_CARE_PROVIDER_SITE_OTHER): Payer: Self-pay | Admitting: Nurse Practitioner

## 2024-04-17 ENCOUNTER — Encounter: Payer: Self-pay | Admitting: Nurse Practitioner

## 2024-04-17 ENCOUNTER — Other Ambulatory Visit (HOSPITAL_COMMUNITY)
Admission: RE | Admit: 2024-04-17 | Discharge: 2024-04-17 | Disposition: A | Source: Ambulatory Visit | Attending: Nurse Practitioner | Admitting: Nurse Practitioner

## 2024-04-17 VITALS — BP 120/79 | HR 75 | Temp 98.5°F | Ht 60.0 in | Wt 105.0 lb

## 2024-04-17 DIAGNOSIS — Z0000 Encounter for general adult medical examination without abnormal findings: Secondary | ICD-10-CM

## 2024-04-17 DIAGNOSIS — D229 Melanocytic nevi, unspecified: Secondary | ICD-10-CM

## 2024-04-17 NOTE — Progress Notes (Signed)
 Subjective   [name], female    DOB: 1975-09-22, 48 y.o.   MRN: [id number]  Chief Complaint  Patient presents with   Gynecologic Exam    Pap and has atypical mole to chest    Referring provider: Oley Bascom RAMAN, NP  [name] is a 47 y.o. female with Past Medical History: No date: Hypertension No date: Left shoulder pain 12/31/2015: Tobacco dependence   Gynecologic Exam The patient's pertinent negatives include no genital itching, genital odor, pelvic pain or vaginal discharge. She is sexually active. It is unknown whether or not her partner has an STD. Her menstrual history has been regular.    Patient does complain today of an atypical mole to her chest.  We will refer her to dermatology for further evaluation and treatment.   Denies f/c/s, n/v/d, hemoptysis, PND, leg swelling Denies chest pain or edema       No Known Allergies  Immunization History  Administered Date(s) Administered   Influenza, Seasonal, Injecte, Preservative Fre 05/02/2023   Influenza,inj,Quad PF,6+ Mos 03/15/2018, 06/16/2020, 04/27/2021   Tdap 08/04/2016    Tobacco History: Social History   Tobacco Use  Smoking Status Every Day   Current packs/day: 1.00   Average packs/day: 1 pack/day for 15.0 years (15.0 ttl pk-yrs)   Types: Cigarettes  Smokeless Tobacco Never  Tobacco Comments   down to a half pack   Ready to quit: Not Answered Counseling given: Not Answered Tobacco comments: down to a half pack   Outpatient Encounter Medications as of 04/17/2024  Medication Sig   acetaminophen  (TYLENOL ) 500 MG tablet Take 2 tablets (1,000 mg total) by mouth every 6 (six) hours as needed.   amLODipine  (NORVASC ) 5 MG tablet Take 1 tablet (5 mg total) by mouth daily.   cetirizine  (ZYRTEC  ALLERGY) 10 MG tablet Take 1 tablet (10 mg total) by mouth daily.   lidocaine  (LIDODERM ) 5 % Place 1 patch onto the skin daily. Remove & Discard patch within 12 hours or as directed  by MD   loratadine  (CLARITIN ) 10 MG tablet Take 1 tablet (10 mg total) by mouth daily.   erythromycin  ophthalmic ointment Place a 1/2 inch ribbon of ointment into the lower eyelid. (Patient not taking: Reported on 04/17/2024)   methocarbamol  (ROBAXIN ) 500 MG tablet Take 1 tablet (500 mg total) by mouth every 6 (six) hours as needed for muscle spasms. (Patient not taking: Reported on 04/17/2024)   mupirocin  ointment (BACTROBAN ) 2 % Apply 1 Application topically 2 (two) times daily.   olopatadine  (PATADAY ) 0.1 % ophthalmic solution Place 1 drop into both eyes 2 (two) times daily. (Patient not taking: Reported on 04/17/2024)   No facility-administered encounter medications on file as of 04/17/2024.    Review of Systems  Review of Systems  Constitutional: Negative.   HENT: Negative.    Cardiovascular: Negative.   Gastrointestinal: Negative.   Genitourinary:  Negative for pelvic pain and vaginal discharge.  Allergic/Immunologic: Negative.   Neurological: Negative.   Psychiatric/Behavioral: Negative.       Objective:   BP 120/79   Pulse 75   Temp 98.5 F (36.9 C)   Ht 5' (1.524 m)   Wt 105 lb (47.6 kg)   SpO2 100%   BMI 20.51 kg/m   Wt Readings from Last 5 Encounters:  04/17/24 105 lb (47.6 kg)  02/02/24 109 lb (49.4 kg)  11/02/23 111 lb (50.3 kg)  06/13/23 109 lb (49.4 kg)  05/02/23 108 lb (49 kg)  Physical Exam Vitals and nursing note reviewed. Exam conducted with a chaperone present.  Constitutional:      General: She is not in acute distress.    Appearance: She is well-developed.  Cardiovascular:     Rate and Rhythm: Normal rate and regular rhythm.  Pulmonary:     Effort: Pulmonary effort is normal.     Breath sounds: Normal breath sounds.  Chest:       Comments: Dark atypical mole noted Genitourinary:    General: Normal vulva.     Vagina: Normal.     Cervix: Normal.     Uterus: Normal.      Adnexa: Right adnexa normal.  Neurological:     Mental Status:  She is alert and oriented to person, place, and time.       Assessment & Plan:   Annual physical exam -     Cytology - PAP  Atypical mole -     Ambulatory referral to Dermatology     Return if symptoms worsen or fail to improve.   Bascom GORMAN Borer, NP 04/17/2024

## 2024-05-07 ENCOUNTER — Other Ambulatory Visit: Payer: Self-pay | Admitting: Nurse Practitioner

## 2024-05-07 ENCOUNTER — Other Ambulatory Visit: Payer: Self-pay

## 2024-05-08 ENCOUNTER — Other Ambulatory Visit: Payer: Self-pay

## 2024-05-09 ENCOUNTER — Other Ambulatory Visit: Payer: Self-pay

## 2024-05-09 MED ORDER — METHOCARBAMOL 500 MG PO TABS
500.0000 mg | ORAL_TABLET | Freq: Four times a day (QID) | ORAL | 0 refills | Status: DC | PRN
  Filled 2024-05-09: qty 20, 5d supply, fill #0

## 2024-05-10 ENCOUNTER — Ambulatory Visit: Payer: Self-pay | Admitting: Nurse Practitioner

## 2024-05-10 ENCOUNTER — Telehealth (HOSPITAL_BASED_OUTPATIENT_CLINIC_OR_DEPARTMENT_OTHER): Payer: Self-pay

## 2024-05-10 DIAGNOSIS — R87618 Other abnormal cytological findings on specimens from cervix uteri: Secondary | ICD-10-CM

## 2024-05-10 NOTE — Telephone Encounter (Signed)
 Dr. Cleotilde is reaching out to director of the lab to ask if they can add HPV sub-typing to pap completed by PCP so we can determine how to schedule an appointment for patient.   Morna LOISE Quale, RN

## 2024-05-10 NOTE — Telephone Encounter (Signed)
Spoke with patient and made her aware of results and recommendations.

## 2024-05-10 NOTE — Telephone Encounter (Signed)
 This patient is calling about a referral. Could you look at it and see what she needs.

## 2024-05-10 NOTE — Telephone Encounter (Signed)
-----   Message from Bascom GORMAN Borer sent at 05/10/2024  8:02 AM EDT ----- Pap with positive for HPV.  Referral will be placed to OB/GYN. ----- Message ----- From: Interface, Lab In [facility] Sent: 05/03/2024   3:18 PM EDT To: Bascom GORMAN Borer, NP

## 2024-05-13 ENCOUNTER — Telehealth (HOSPITAL_BASED_OUTPATIENT_CLINIC_OR_DEPARTMENT_OTHER): Payer: Self-pay

## 2024-05-13 NOTE — Telephone Encounter (Signed)
 Error

## 2024-05-13 NOTE — Telephone Encounter (Signed)
 Spoke with patient. Advised Dr.Miller had the lab add on subtyping for HPV to her pap smear. Advised once these results return we will contact her with the results and get her scheduled for the appropriate appointment. Patient is agreeable.

## 2024-05-13 NOTE — Telephone Encounter (Signed)
 Pt just checking back in on her referral.

## 2024-05-16 LAB — CYTOLOGY - PAP
Chlamydia: NEGATIVE
Comment: NEGATIVE
Comment: NEGATIVE
Comment: NEGATIVE
Comment: NEGATIVE
Comment: NEGATIVE
Comment: NORMAL
Diagnosis: NEGATIVE
HPV 16: NEGATIVE
HPV 18 / 45: NEGATIVE
HSV1: NEGATIVE
HSV2: NEGATIVE
High risk HPV: POSITIVE — AB
Neisseria Gonorrhea: NEGATIVE
Trichomonas: NEGATIVE

## 2024-05-18 ENCOUNTER — Ambulatory Visit (HOSPITAL_COMMUNITY)
Admission: EM | Admit: 2024-05-18 | Discharge: 2024-05-18 | Disposition: A | Discharge status: Home / Self Care | Attending: Family Medicine | Admitting: Family Medicine

## 2024-05-18 ENCOUNTER — Encounter (HOSPITAL_COMMUNITY): Payer: Self-pay | Admitting: Emergency Medicine

## 2024-05-18 DIAGNOSIS — L0291 Cutaneous abscess, unspecified: Secondary | ICD-10-CM | POA: Diagnosis not present

## 2024-05-18 MED ORDER — KETOROLAC TROMETHAMINE 30 MG/ML IJ SOLN
INTRAMUSCULAR | Status: AC
  Filled 2024-05-18: qty 1

## 2024-05-18 MED ORDER — KETOROLAC TROMETHAMINE 10 MG PO TABS: 10.0000 mg | ORAL_TABLET | Freq: Four times a day (QID) | ORAL | 0 refills | Status: AC | PRN

## 2024-05-18 MED ORDER — KETOROLAC TROMETHAMINE 30 MG/ML IJ SOLN
30.0000 mg | Freq: Once | INTRAMUSCULAR | Status: AC
  Administered 2024-05-18: 30 mg via INTRAMUSCULAR

## 2024-05-18 MED ORDER — LIDOCAINE HCL (PF) 1 % IJ SOLN
INTRAMUSCULAR | Status: AC
  Filled 2024-05-18: qty 2

## 2024-05-18 MED ORDER — LIDOCAINE-EPINEPHRINE-TETRACAINE (LET) TOPICAL GEL
TOPICAL | Status: AC
  Filled 2024-05-18: qty 3

## 2024-05-18 MED ORDER — CEPHALEXIN 500 MG PO CAPS: 500.0000 mg | ORAL_CAPSULE | Freq: Two times a day (BID) | ORAL | 0 refills | Status: AC

## 2024-05-18 NOTE — ED Triage Notes (Signed)
 PT REPORTS LEFT AXILLA ABSCESS FOR 3 DAYS. PT BEEN PUTTING NEOSPORIN ON IT AND TRYING TO COVER IT. REPORTS LEAKS BUT VERY MINIMAL AND SLOW.

## 2024-05-18 NOTE — ED Provider Notes (Signed)
 [facility]    CSN: [id number] Arrival date & time: 05/18/24  1728      History   Chief Complaint Chief Complaint  Patient presents with   Abscess    HPI [name] is a 48 y.o. female.    Abscess  Here for swelling and pain and redness and drainage in her left axilla.  It began bothering her about 3 days ago but now its gotten more painful.  It is leaking a little bit of purulent drainage but flow was not very fast.  NKDA  Last menstrual cycle was October 18  Past Medical History:  Diagnosis Date   Hypertension    Left shoulder pain    Tobacco dependence 12/31/2015    Patient Active Problem List   Diagnosis Date Noted   Need for influenza vaccination 05/02/2023   Abscess of left axilla 03/12/2019   Radiculopathy of cervicothoracic region 11/22/2016   Essential hypertension 11/21/2016   Injury of left shoulder 08/04/2016   Muscle spasm of left shoulder 12/31/2015   Left shoulder pain 12/31/2015   Tobacco dependence 12/31/2015    Past Surgical History:  Procedure Laterality Date   CESAREAN SECTION     TUBAL LIGATION      OB History   No obstetric history on file.      Home Medications    Prior to Admission medications   Medication Sig Start Date End Date Taking? Authorizing Provider  cephALEXin  (KEFLEX ) 500 MG capsule Take 1 capsule (500 mg total) by mouth 2 (two) times daily for 7 days. 05/18/24 05/25/24 Yes Vonna Sharlet POUR, MD  ketorolac  (TORADOL ) 10 MG tablet Take 1 tablet (10 mg total) by mouth every 6 (six) hours as needed (pain). 05/18/24  Yes Vonna Sharlet POUR, MD  acetaminophen  (TYLENOL ) 500 MG tablet Take 2 tablets (1,000 mg total) by mouth every 6 (six) hours as needed. 05/03/22   Tilford Bertram HERO, FNP  amLODipine  (NORVASC ) 5 MG tablet Take 1 tablet (5 mg total) by mouth daily. 02/02/24   Oley Bascom RAMAN, NP  cetirizine  (ZYRTEC  ALLERGY) 10 MG tablet Take 1 tablet (10 mg total) by mouth daily. 02/02/24   Oley Bascom RAMAN,  NP  erythromycin  ophthalmic ointment Place a 1/2 inch ribbon of ointment into the lower eyelid. Patient not taking: Reported on 04/17/2024 11/17/23   Johnie Flaming A, NP  lidocaine  (LIDODERM ) 5 % Place 1 patch onto the skin daily. Remove & Discard patch within 12 hours or as directed by MD    [provider]  loratadine  (CLARITIN ) 10 MG tablet Take 1 tablet (10 mg total) by mouth daily. 11/02/23   Oley Bascom RAMAN, NP  methocarbamol  (ROBAXIN ) 500 MG tablet Take 1 tablet (500 mg total) by mouth every 6 (six) hours as needed for muscle spasms. 05/09/24   Oley Bascom RAMAN, NP  mupirocin  ointment (BACTROBAN ) 2 % Apply 1 Application topically 2 (two) times daily. 03/17/24   White, Elizabeth A, PA-C  olopatadine  (PATADAY ) 0.1 % ophthalmic solution Place 1 drop into both eyes 2 (two) times daily. Patient not taking: Reported on 04/17/2024 11/17/23   Johnie Flaming LABOR, NP    Family History Family History  Problem Relation Age of Onset   Cancer Father     Social History Social History   Tobacco Use   Smoking status: Every Day    Current packs/day: 1.00    Average packs/day: 1 pack/day for 15.0 years (15.0 ttl pk-yrs)    Types: Cigarettes  Smokeless tobacco: Never   Tobacco comments:    down to a half pack  Vaping Use   Vaping status: Never Used  Substance Use Topics   Alcohol use: No   Drug use: No     Allergies   Patient has no known allergies.   Review of Systems Review of Systems   Physical Exam Triage Vital Signs ED Triage Vitals  Encounter Vitals Group     BP 05/18/24 1800 108/65     Girls Systolic BP Percentile --      Girls Diastolic BP Percentile --      Boys Systolic BP Percentile --      Boys Diastolic BP Percentile --      Pulse Rate 05/18/24 1800 69     Resp 05/18/24 1800 14     Temp 05/18/24 1800 98.2 F (36.8 C)     Temp Source 05/18/24 1800 Oral     SpO2 05/18/24 1800 98 %     Weight --      Height --      Head Circumference --      Peak  Flow --      Pain Score 05/18/24 1759 10     Pain Loc --      Pain Education --      Exclude from Growth Chart --    No data found.  Updated Vital Signs BP 108/65 (BP Location: Right Arm)   Pulse 69   Temp 98.2 F (36.8 C) (Oral)   Resp 14   LMP 05/11/2024 (Exact Date)   SpO2 98%   Visual Acuity Right Eye Distance:   Left Eye Distance:   Bilateral Distance:    Right Eye Near:   Left Eye Near:    Bilateral Near:     Physical Exam Vitals reviewed.  Constitutional:      General: She is not in acute distress.    Appearance: She is not ill-appearing, toxic-appearing or diaphoretic.  Skin:    Coloration: Skin is not pale.     Comments: In her left axilla there is an area of erythema and swelling and fluctuance that is about 2.5 cm in diameter.  It is very raised.  There is a central area that is draining a little bit of yellow purulence.  There is still a lot of fluid in the abscess.  Neurological:     General: No focal deficit present.     Mental Status: She is alert and oriented to person, place, and time.  Psychiatric:        Behavior: Behavior normal.      UC Treatments / Results  Labs (all labs ordered are listed, but only abnormal results are displayed) Labs Reviewed - No data to display  EKG   Radiology No results found.  Procedures Procedures (including critical care time)  Medications Ordered in UC Medications  ketorolac  (TORADOL ) 30 MG/ML injection 30 mg (has no administration in time range)    Initial Impression / Assessment and Plan / UC Course  I have reviewed the triage vital signs and the nursing notes.  Pertinent labs & imaging results that were available during my care of the patient were reviewed by me and considered in my medical decision making (see chart for details).     Risks and benefits of I&D are discussed and verbal consent is given  LET is first applied for 10 minutes to the abscess roof.  1% lidocaine  plain is used to  inject the  roof of the abscess  Under clean conditions #11 blade is used to make a stab wound in the roof of the abscess, adjacent to the area already draining. 1-2 ml purulent material obtained, some sebaceous material also. No complications. EBL zero.  Wound care is explained.  Keflex  is sent in for the infection  Toradol  injection is given here for the pain and Toradol  tablets are sent to the pharmacy. She states she has a follow-up appointment with dermatology in the future. Final Clinical Impressions(s) / UC Diagnoses   Final diagnoses:  Abscess     Discharge Instructions      You have been given a shot of Toradol  30 mg today.  Cephalexin  500 mg --1 tablet by mouth 2 times daily for 7 days.  Ketorolac  10 mg tablets--take 1 tablet every 6 hours as needed for pain.  This is the same medicine that is in the shot we just gave you  Wash the area with soapy water a couple of times a day and then put a new bandage on     ED Prescriptions     Medication Sig Dispense Auth. Provider   ketorolac  (TORADOL ) 10 MG tablet Take 1 tablet (10 mg total) by mouth every 6 (six) hours as needed (pain). 20 tablet Siara Gorder, Sharlet POUR, MD   cephALEXin  (KEFLEX ) 500 MG capsule Take 1 capsule (500 mg total) by mouth 2 (two) times daily for 7 days. 14 capsule Vonna, Thresia Ramanathan K, MD      PDMP not reviewed this encounter.   Vonna Sharlet POUR, MD 05/18/24 [phone]

## 2024-05-18 NOTE — Discharge Instructions (Signed)
 You have been given a shot of Toradol  30 mg today.  Cephalexin  500 mg --1 tablet by mouth 2 times daily for 7 days.  Ketorolac  10 mg tablets--take 1 tablet every 6 hours as needed for pain.  This is the same medicine that is in the shot we just gave you  Wash the area with soapy water a couple of times a day and then put a new bandage on

## 2024-05-20 ENCOUNTER — Other Ambulatory Visit: Payer: Self-pay

## 2024-06-07 ENCOUNTER — Other Ambulatory Visit: Payer: Self-pay | Admitting: Nurse Practitioner

## 2024-06-07 ENCOUNTER — Other Ambulatory Visit: Payer: Self-pay

## 2024-06-07 MED ORDER — CETIRIZINE HCL 10 MG PO TABS
10.0000 mg | ORAL_TABLET | Freq: Every day | ORAL | 2 refills | Status: AC
  Filled 2024-06-07: qty 30, 30d supply, fill #0
  Filled 2024-07-31: qty 30, 30d supply, fill #1

## 2024-06-14 ENCOUNTER — Other Ambulatory Visit: Payer: Self-pay

## 2024-07-15 ENCOUNTER — Ambulatory Visit (HOSPITAL_COMMUNITY): Admission: EM | Admit: 2024-07-15 | Discharge: 2024-07-15 | Disposition: A | Discharge status: Home / Self Care

## 2024-07-15 ENCOUNTER — Encounter (HOSPITAL_COMMUNITY): Payer: Self-pay | Admitting: *Deleted

## 2024-07-15 DIAGNOSIS — M20012 Mallet finger of left finger(s): Secondary | ICD-10-CM | POA: Diagnosis not present

## 2024-07-15 NOTE — ED Provider Notes (Signed)
 " [facility]    CSN: [id number] Arrival date & time: 07/15/24  0806      History   Chief Complaint Chief Complaint  Patient presents with   Finger Injury    HPI KAMILLE TOOMEY is a 48 y.o. female.   HPI  Patient is a 48 year old female who presents today complaining of left fourth finger problems.  Says she was changing close roughly 1 week ago when she felt a pulling and sudden pop in her fourth finger.  Denies any significant pain that occurred during injury.  Since injury has occurred she is unable to fully extend her failure without passive assistance from her other hand.  She has not tried any treatment so far.  Denies any lacerations or skin breakdown.  Has never had an injury like this in the past.  Past Medical History:  Diagnosis Date   Hypertension    Left shoulder pain    Tobacco dependence 12/31/2015    Patient Active Problem List   Diagnosis Date Noted   Need for influenza vaccination 05/02/2023   Abscess of left axilla 03/12/2019   Radiculopathy of cervicothoracic region 11/22/2016   Essential hypertension 11/21/2016   Injury of left shoulder 08/04/2016   Muscle spasm of left shoulder 12/31/2015   Left shoulder pain 12/31/2015   Tobacco dependence 12/31/2015    Past Surgical History:  Procedure Laterality Date   CESAREAN SECTION     TUBAL LIGATION      OB History   No obstetric history on file.      Home Medications    Prior to Admission medications  Medication Sig Start Date End Date Taking? Authorizing Provider  amLODipine  (NORVASC ) 5 MG tablet Take 1 tablet (5 mg total) by mouth daily. 02/02/24  Yes Oley Bascom RAMAN, NP  loratadine  (CLARITIN ) 10 MG tablet Take 1 tablet (10 mg total) by mouth daily. 11/02/23  Yes Oley Bascom RAMAN, NP  acetaminophen  (TYLENOL ) 500 MG tablet Take 2 tablets (1,000 mg total) by mouth every 6 (six) hours as needed. 05/03/22   Tilford Bertram HERO, FNP  cetirizine  (ZYRTEC  ALLERGY) 10 MG tablet Take 1  tablet (10 mg total) by mouth daily. 06/07/24   Oley Bascom RAMAN, NP  erythromycin  ophthalmic ointment Place a 1/2 inch ribbon of ointment into the lower eyelid. Patient not taking: Reported on 02/02/2024 11/17/23   Johnie Flaming A, NP  ketorolac  (TORADOL ) 10 MG tablet Take 1 tablet (10 mg total) by mouth every 6 (six) hours as needed (pain). 05/18/24   Vonna Sharlet POUR, MD  lidocaine  (LIDODERM ) 5 % Place 1 patch onto the skin daily. Remove & Discard patch within 12 hours or as directed by MD    [provider]  methocarbamol  (ROBAXIN ) 500 MG tablet Take 1 tablet (500 mg total) by mouth every 6 (six) hours as needed for muscle spasms. 05/09/24   Oley Bascom RAMAN, NP  mupirocin  ointment (BACTROBAN ) 2 % Apply 1 Application topically 2 (two) times daily. 03/17/24   White, Elizabeth A, PA-C  olopatadine  (PATADAY ) 0.1 % ophthalmic solution Place 1 drop into both eyes 2 (two) times daily. Patient not taking: Reported on 02/02/2024 11/17/23   Johnie Flaming LABOR, NP    Family History Family History  Problem Relation Age of Onset   Cancer Father     Social History Social History[1]   Allergies   Patient has no known allergies.   Review of Systems Review of Systems  ROS negative except as noted in  HPI above    Physical Exam Triage Vital Signs ED Triage Vitals [07/15/24 0827]  Encounter Vitals Group     BP      Girls Systolic BP Percentile      Girls Diastolic BP Percentile      Boys Systolic BP Percentile      Boys Diastolic BP Percentile      Pulse      Resp      Temp      Temp src      SpO2      Weight      Height      Head Circumference      Peak Flow      Pain Score 8     Pain Loc      Pain Education      Exclude from Growth Chart    No data found.  Updated Vital Signs LMP 06/17/2024 (Approximate)   Visual Acuity Right Eye Distance:   Left Eye Distance:   Bilateral Distance:    Right Eye Near:   Left Eye Near:    Bilateral Near:     Physical  Exam  On assessment of patient's left ring finger no evidence of erythema, significant ecchymoses, or significant edema present.  Patient has no tenderness to palpation over bony landmarks.  Patient has full flexion range of motion but is unable to extend ring finger at DIP joint.  Able to achieve full extension with passive extension.  Neurovascular intact distally.  Exam consistent with mallet finger.  UC Treatments / Results  Labs (all labs ordered are listed, but only abnormal results are displayed) Labs Reviewed - No data to display  EKG   Radiology No results found.  Procedures Procedures (including critical care time)  Medications Ordered in UC Medications - No data to display  Initial Impression / Assessment and Plan / UC Course  I have reviewed the triage vital signs and the nursing notes.  Pertinent labs & imaging results that were available during my care of the patient were reviewed by me and considered in my medical decision making (see chart for details).     Mallet finger  Final Clinical Impressions(s) / UC Diagnoses   #Mallet finger of left fourth digit Pt declined initial xrays; states its not broken Wear extension splint 24/7 for next 8 weeks. At 4-week mark schedule follow-up schedule follow-up with PCP or urgent care to assess integrity of skin and ensure splint is still working properly. It is critical that you keep finger fully extended or even hyperextended for the next 8 weeks to ensure proper healing of extensor tendon Referral sent to sports medicine clinic in case you have additional questions or concerns during healing process.  I would recommend scheduling follow-up with him in 8 weeks to ensure full healing of finger.   Final diagnoses:  None   Discharge Instructions   None    ED Prescriptions   None    PDMP not reviewed this encounter.    [1]  Social History Tobacco Use   Smoking status: Every Day    Current packs/day: 1.00     Average packs/day: 1 pack/day for 15.0 years (15.0 ttl pk-yrs)    Types: Cigarettes   Smokeless tobacco: Never   Tobacco comments:    down to a half pack  Vaping Use   Vaping status: Never Used  Substance Use Topics   Alcohol use: No   Drug use: No     [place],  [place], DO 07/15/24 0901  "

## 2024-07-15 NOTE — ED Triage Notes (Signed)
 Pt states she has left ring finger injury X 1 week. She bent it backwards when changing clothes.She has been taking tylenol .

## 2024-07-15 NOTE — Discharge Instructions (Addendum)
 Wear extension splint 24/7 for next 8 weeks. At 4-week mark schedule follow-up schedule follow-up with PCP or urgent care to assess integrity of skin and ensure splint is still working properly. It is critical that you keep finger fully extended or even hyperextended for the next 8 weeks to ensure proper healing of extensor tendon Referral sent to sports medicine clinic in case you have additional questions or concerns during healing process.  I would recommend scheduling follow-up with him in 8 weeks to ensure full healing of finger.

## 2024-07-22 ENCOUNTER — Ambulatory Visit: Admitting: Family Medicine

## 2024-07-22 VITALS — BP 110/68 | Ht 60.0 in | Wt 110.0 lb

## 2024-07-22 DIAGNOSIS — M20012 Mallet finger of left finger(s): Secondary | ICD-10-CM | POA: Diagnosis present

## 2024-07-22 NOTE — Progress Notes (Signed)
 PCP: Oley Bascom RAMAN, NP  Discussed the use of AI scribe software for clinical note transcription with the patient, who gave verbal consent to proceed.  History of Present Illness [name] is a 48 year old female who presents for follow-up evaluation of an acute left ring finger mallet injury.  Mechanism of injury and initial symptoms - Sustained a forced flexion injury to the left ring finger one week ago while pulling down clothes - Immediate onset of paresthesia, swelling, and ecchymosis in the affected finger  Swelling, ecchymosis, and alignment - Initial swelling and discoloration worsened with intermittent splint removal - Gradual improvement in finger alignment; finger is described as 'straight, but not straight' - has not flexed at DIP since urgent care visit.  Splinting and immobilization - Initially used an over-the-counter splint but unable to maintain continuous immobilization due to occupational hand submersion and dishwashing - After urgent care evaluation, a sports medicine fellow applied a splint, which has been kept on continuously except for brief removal for hand hygiene and independent splint changes  Pain and analgesic use - Denies pain - No use of analgesics  Functional limitations - Able to perform most work duties with the right hand - Unable to wash dishes to keep the splint dry - No other functional limitations  Past Medical History:  Diagnosis Date   Hypertension    Left shoulder pain    Tobacco dependence 12/31/2015    Medications Ordered Prior to Encounter[1]  Past Surgical History:  Procedure Laterality Date   CESAREAN SECTION     TUBAL LIGATION      Allergies[2]  BP 110/68   Ht 5' (1.524 m)   Wt 110 lb (49.9 kg)   LMP 06/17/2024 (Approximate)   BMI 21.48 kg/m       No data to display              No data to display              Objective:  Physical Exam:  Gen: NAD, comfortable in exam room  Left  hand: Splint kept in place with 4th DIP in extension.  No malrotation, angulation, swelling, bruising. No tenderness. FROM other joints/digits. NVI distally.  Assessment & Plan Mallet finger of the left ring finger Subacute mallet injury with likely extensor tendon disruption. Favorable prognosis with strict splinting adherence to prevent deformity. - Reinforced continuous splinting for 6 weeks full-time, then 2 weeks at night. - Provided education on splint removal for hygiene, maintaining finger extension, and recommending assistance. - Advised against submerging splint or finger in water. - Issued work note for continuous splint use and avoidance of tasks requiring splint removal. - Scheduled follow-up in 7 weeks to reassess healing and function.     [1]  Current Outpatient Medications on File Prior to Visit  Medication Sig Dispense Refill   acetaminophen  (TYLENOL ) 500 MG tablet Take 2 tablets (1,000 mg total) by mouth every 6 (six) hours as needed. 60 tablet 2   amLODipine  (NORVASC ) 5 MG tablet Take 1 tablet (5 mg total) by mouth daily. 90 tablet 2   cetirizine  (ZYRTEC  ALLERGY) 10 MG tablet Take 1 tablet (10 mg total) by mouth daily. 30 tablet 2   erythromycin  ophthalmic ointment Place a 1/2 inch ribbon of ointment into the lower eyelid. (Patient not taking: Reported on 02/02/2024) 3.5 g 0   ketorolac  (TORADOL ) 10 MG tablet Take 1 tablet (10 mg total) by mouth every 6 (six) hours as needed (pain). 20  tablet 0   lidocaine  (LIDODERM ) 5 % Place 1 patch onto the skin daily. Remove & Discard patch within 12 hours or as directed by MD     loratadine  (CLARITIN ) 10 MG tablet Take 1 tablet (10 mg total) by mouth daily. 30 tablet 11   methocarbamol  (ROBAXIN ) 500 MG tablet Take 1 tablet (500 mg total) by mouth every 6 (six) hours as needed for muscle spasms. 20 tablet 0   mupirocin  ointment (BACTROBAN ) 2 % Apply 1 Application topically 2 (two) times daily. 22 g 1   olopatadine  (PATADAY ) 0.1 %  ophthalmic solution Place 1 drop into both eyes 2 (two) times daily. (Patient not taking: Reported on 02/02/2024) 5 mL 0   No current facility-administered medications on file prior to visit.  [2] No Known Allergies

## 2024-07-31 ENCOUNTER — Other Ambulatory Visit: Payer: Self-pay

## 2024-08-02 ENCOUNTER — Ambulatory Visit (INDEPENDENT_AMBULATORY_CARE_PROVIDER_SITE_OTHER): Payer: Self-pay | Admitting: Nurse Practitioner

## 2024-08-02 ENCOUNTER — Other Ambulatory Visit: Payer: Self-pay

## 2024-08-02 ENCOUNTER — Encounter: Payer: Self-pay | Admitting: Nurse Practitioner

## 2024-08-02 VITALS — BP 112/72 | HR 79 | Temp 97.9°F | Wt 110.0 lb

## 2024-08-02 DIAGNOSIS — Z0000 Encounter for general adult medical examination without abnormal findings: Secondary | ICD-10-CM

## 2024-08-02 DIAGNOSIS — Z13220 Encounter for screening for lipoid disorders: Secondary | ICD-10-CM | POA: Diagnosis not present

## 2024-08-02 MED ORDER — METHOCARBAMOL 500 MG PO TABS
500.0000 mg | ORAL_TABLET | Freq: Four times a day (QID) | ORAL | 0 refills | Status: AC | PRN
  Filled 2024-08-02: qty 20, 5d supply, fill #0

## 2024-08-02 NOTE — Progress Notes (Signed)
 "  Subjective   [name], female    DOB: Mar 20, 1976, 49 y.o.   MRN: [id number]  Chief Complaint  Patient presents with   Follow-up    Referring provider: Oley Bascom RAMAN, NP  [name] is a 49 y.o. female with Past Medical History: No date: Hypertension No date: Left shoulder pain 12/31/2015: Tobacco dependence   HPI  Patient presents today for a follow-up visit.  She does need labs today.  She does need a refill on Robaxin .  No new issues or concerns today. Denies f/c/s, n/v/d, hemoptysis, PND, leg swelling Denies chest pain or edema    Allergies[1]  Immunization History  Administered Date(s) Administered   Influenza, Seasonal, Injecte, Preservative Fre 05/02/2023   Influenza,inj,Quad PF,6+ Mos 03/15/2018, 06/16/2020, 04/27/2021   Tdap 08/04/2016    Tobacco History: Tobacco Use History[2] Ready to quit: Not Answered Counseling given: Not Answered Tobacco comments: down to a half pack   Outpatient Encounter Medications as of 08/02/2024  Medication Sig   acetaminophen  (TYLENOL ) 500 MG tablet Take 2 tablets (1,000 mg total) by mouth every 6 (six) hours as needed.   amLODipine  (NORVASC ) 5 MG tablet Take 1 tablet (5 mg total) by mouth daily.   cetirizine  (ZYRTEC  ALLERGY) 10 MG tablet Take 1 tablet (10 mg total) by mouth daily.   lidocaine  (LIDODERM ) 5 % Place 1 patch onto the skin daily. Remove & Discard patch within 12 hours or as directed by MD   loratadine  (CLARITIN ) 10 MG tablet Take 1 tablet (10 mg total) by mouth daily.   erythromycin  ophthalmic ointment Place a 1/2 inch ribbon of ointment into the lower eyelid. (Patient not taking: Reported on 08/02/2024)   ketorolac  (TORADOL ) 10 MG tablet Take 1 tablet (10 mg total) by mouth every 6 (six) hours as needed (pain). (Patient not taking: Reported on 08/02/2024)   methocarbamol  (ROBAXIN ) 500 MG tablet Take 1 tablet (500 mg total) by mouth every 6 (six) hours as needed for muscle spasms.    mupirocin  ointment (BACTROBAN ) 2 % Apply 1 Application topically 2 (two) times daily. (Patient not taking: Reported on 08/02/2024)   olopatadine  (PATADAY ) 0.1 % ophthalmic solution Place 1 drop into both eyes 2 (two) times daily. (Patient not taking: Reported on 08/02/2024)   [DISCONTINUED] methocarbamol  (ROBAXIN ) 500 MG tablet Take 1 tablet (500 mg total) by mouth every 6 (six) hours as needed for muscle spasms.   No facility-administered encounter medications on file as of 08/02/2024.    Review of Systems  Review of Systems  Constitutional: Negative.   HENT: Negative.    Cardiovascular: Negative.   Gastrointestinal: Negative.   Allergic/Immunologic: Negative.   Neurological: Negative.   Psychiatric/Behavioral: Negative.       Objective:   BP 112/72   Pulse 79   Temp 97.9 F (36.6 C) (Temporal)   Wt 110 lb (49.9 kg)   LMP 06/17/2024 (Approximate)   SpO2 97%   BMI 21.48 kg/m   Wt Readings from Last 5 Encounters:  08/02/24 110 lb (49.9 kg)  07/22/24 110 lb (49.9 kg)  04/17/24 105 lb (47.6 kg)  02/02/24 109 lb (49.4 kg)  11/02/23 111 lb (50.3 kg)     Physical Exam Vitals and nursing note reviewed.  Constitutional:      General: She is not in acute distress.    Appearance: She is well-developed.  Cardiovascular:     Rate and Rhythm: Normal rate and regular rhythm.  Pulmonary:     Effort: Pulmonary  effort is normal.     Breath sounds: Normal breath sounds.  Neurological:     Mental Status: She is alert and oriented to person, place, and time.       Assessment & Plan:   Annual physical exam -     CBC -     Comprehensive metabolic panel with GFR  Lipid screening -     Lipid panel  Other orders -     Methocarbamol ; Take 1 tablet (500 mg total) by mouth every 6 (six) hours as needed for muscle spasms.  Dispense: 20 tablet; Refill: 0     Return in about 6 months (around 01/30/2025).     Bascom GORMAN Borer, NP 08/02/2024     [1] No Known Allergies [2]   Social History Tobacco Use  Smoking Status Every Day   Current packs/day: 1.00   Average packs/day: 1 pack/day for 15.0 years (15.0 ttl pk-yrs)   Types: Cigarettes  Smokeless Tobacco Never  Tobacco Comments   down to a half pack   "

## 2024-08-03 LAB — COMPREHENSIVE METABOLIC PANEL WITH GFR
ALT: 23 IU/L (ref 0–32)
AST: 18 IU/L (ref 0–40)
Albumin: 4.2 g/dL (ref 3.9–4.9)
Alkaline Phosphatase: 51 IU/L (ref 41–116)
BUN/Creatinine Ratio: 15 (ref 9–23)
BUN: 14 mg/dL (ref 6–24)
Bilirubin Total: 0.2 mg/dL (ref 0.0–1.2)
CO2: 24 mmol/L (ref 20–29)
Calcium: 9.3 mg/dL (ref 8.7–10.2)
Chloride: 106 mmol/L (ref 96–106)
Creatinine, Ser: 0.94 mg/dL (ref 0.57–1.00)
Globulin, Total: 2.1 g/dL (ref 1.5–4.5)
Glucose: 56 mg/dL — ABNORMAL LOW (ref 70–99)
Potassium: 4.3 mmol/L (ref 3.5–5.2)
Sodium: 140 mmol/L (ref 134–144)
Total Protein: 6.3 g/dL (ref 6.0–8.5)
eGFR: 75 mL/min/1.73

## 2024-08-03 LAB — CBC
Hematocrit: 38.4 % (ref 34.0–46.6)
Hemoglobin: 12.8 g/dL (ref 11.1–15.9)
MCH: 34.6 pg — ABNORMAL HIGH (ref 26.6–33.0)
MCHC: 33.3 g/dL (ref 31.5–35.7)
MCV: 104 fL — ABNORMAL HIGH (ref 79–97)
Platelets: 229 x10E3/uL (ref 150–450)
RBC: 3.7 x10E6/uL — ABNORMAL LOW (ref 3.77–5.28)
RDW: 12 % (ref 11.7–15.4)
WBC: 4.6 x10E3/uL (ref 3.4–10.8)

## 2024-08-03 LAB — LIPID PANEL
Chol/HDL Ratio: 2.3 ratio (ref 0.0–4.4)
Cholesterol, Total: 99 mg/dL — ABNORMAL LOW (ref 100–199)
HDL: 43 mg/dL
LDL Chol Calc (NIH): 44 mg/dL (ref 0–99)
Triglycerides: 46 mg/dL (ref 0–149)
VLDL Cholesterol Cal: 12 mg/dL (ref 5–40)

## 2024-08-05 ENCOUNTER — Ambulatory Visit: Payer: Self-pay | Admitting: Nurse Practitioner

## 2024-08-09 ENCOUNTER — Other Ambulatory Visit: Payer: Self-pay

## 2024-08-21 ENCOUNTER — Ambulatory Visit: Payer: Self-pay | Admitting: Obstetrics & Gynecology

## 2024-08-21 ENCOUNTER — Telehealth: Payer: Self-pay

## 2024-08-21 ENCOUNTER — Encounter: Payer: Self-pay | Admitting: Obstetrics & Gynecology

## 2024-08-21 VITALS — BP 117/77 | HR 68 | Ht 60.0 in | Wt 106.0 lb

## 2024-08-21 DIAGNOSIS — R87810 Cervical high risk human papillomavirus (HPV) DNA test positive: Secondary | ICD-10-CM

## 2024-08-21 NOTE — Telephone Encounter (Signed)
 Copied from CRM #[id number]. Topic: Clinical - Medical Advice >> Aug 21, 2024 10:27 AM Laymon HERO wrote: Reason for CRM: Patient has concerns from an appointment today that she went to for a pap smear- Please contact patient as soon as possible - she has concerns. She is frustrated because they keep making her go back and forth mentioning cancer  Left message returning call back to patient. CB.

## 2024-08-21 NOTE — Progress Notes (Signed)
 49 y.o. New GYN referral presents for COLPO.  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

## 2024-08-21 NOTE — Progress Notes (Signed)
 [name], female   DOB: 13-Mar-1976, 49 y.o.   MRN: [id number]  Chief Complaint  Patient presents with   Colposcopy  Abnl pap  HPI [name] is a 49 y.o. female.  No obstetric history on file.  HPI  Indications: Pap smear on September 2025 showed: HR HPV no typing, negative cytology. Previous colposcopy: no. Prior cervical treatment: no treatment. Patient was referred for colposcopy by PCP. Previous pap was negative Past Medical History:  Diagnosis Date   Hypertension    Left shoulder pain    Tobacco dependence 12/31/2015    Past Surgical History:  Procedure Laterality Date   CESAREAN SECTION     TUBAL LIGATION      Family History  Problem Relation Age of Onset   Cancer Father     Social History Social History[1]  Allergies[2]  Current Outpatient Medications  Medication Sig Dispense Refill   acetaminophen  (TYLENOL ) 500 MG tablet Take 2 tablets (1,000 mg total) by mouth every 6 (six) hours as needed. 60 tablet 2   amLODipine  (NORVASC ) 5 MG tablet Take 1 tablet (5 mg total) by mouth daily. 90 tablet 2   cetirizine  (ZYRTEC  ALLERGY) 10 MG tablet Take 1 tablet (10 mg total) by mouth daily. 30 tablet 2   erythromycin  ophthalmic ointment Place a 1/2 inch ribbon of ointment into the lower eyelid. (Patient not taking: Reported on 08/02/2024) 3.5 g 0   ketorolac  (TORADOL ) 10 MG tablet Take 1 tablet (10 mg total) by mouth every 6 (six) hours as needed (pain). (Patient not taking: Reported on 08/02/2024) 20 tablet 0   lidocaine  (LIDODERM ) 5 % Place 1 patch onto the skin daily. Remove & Discard patch within 12 hours or as directed by MD     loratadine  (CLARITIN ) 10 MG tablet Take 1 tablet (10 mg total) by mouth daily. 30 tablet 11   methocarbamol  (ROBAXIN ) 500 MG tablet Take 1 tablet (500 mg total) by mouth every 6 (six) hours as needed for muscle spasms. 20 tablet 0   mupirocin  ointment (BACTROBAN ) 2 % Apply 1 Application topically 2 (two) times daily. (Patient  not taking: Reported on 08/02/2024) 22 g 1   olopatadine  (PATADAY ) 0.1 % ophthalmic solution Place 1 drop into both eyes 2 (two) times daily. (Patient not taking: Reported on 08/02/2024) 5 mL 0   No current facility-administered medications for this visit.    Review of Systems Review of Systems  Constitutional: Negative.   Genitourinary: Negative.     Blood pressure 117/77, pulse 68, height 5' (1.524 m), weight 106 lb (48.1 kg).  Physical Exam Physical Exam Vitals reviewed.  Constitutional:      Appearance: Normal appearance.  Cardiovascular:     Rate and Rhythm: Normal rate.  Pulmonary:     Effort: Pulmonary effort is normal.  Neurological:     General: No focal deficit present.     Mental Status: She is alert.  Psychiatric:        Mood and Affect: Mood normal.        Behavior: Behavior normal.     Data Reviewed Pap 2025 and 2020  Assessment    Nl cytology HR HPV    Plan    Repeat pap after a year approx 03/2025 Screening guidelines discussed with her      Lynwood Solomons 08/21/2024, 9:14 AM     [1]  Social History Tobacco Use   Smoking status: Every Day    Current packs/day: 1.00  Average packs/day: 1 pack/day for 15.0 years (15.0 ttl pk-yrs)    Types: Cigarettes   Smokeless tobacco: Never   Tobacco comments:    down to a half pack  Vaping Use   Vaping status: Never Used  Substance Use Topics   Alcohol use: No   Drug use: No  [2] No Known Allergies

## 2024-11-01 ENCOUNTER — Ambulatory Visit: Payer: Self-pay | Admitting: Nurse Practitioner

## 2024-11-18 ENCOUNTER — Ambulatory Visit: Admitting: Physician Assistant
